# Patient Record
Sex: Female | Born: 1955
Health system: Southern US, Community
[De-identification: ages and names within clinical notes are randomized; demographics above are authoritative.]

## PROBLEM LIST (undated history)

## (undated) DIAGNOSIS — T8859XA Other complications of anesthesia, initial encounter: Secondary | ICD-10-CM

## (undated) DIAGNOSIS — M1990 Unspecified osteoarthritis, unspecified site: Secondary | ICD-10-CM

## (undated) DIAGNOSIS — D649 Anemia, unspecified: Secondary | ICD-10-CM

## (undated) DIAGNOSIS — C801 Malignant (primary) neoplasm, unspecified: Secondary | ICD-10-CM

## (undated) DIAGNOSIS — F419 Anxiety disorder, unspecified: Secondary | ICD-10-CM

## (undated) DIAGNOSIS — K219 Gastro-esophageal reflux disease without esophagitis: Secondary | ICD-10-CM

## (undated) DIAGNOSIS — T4145XA Adverse effect of unspecified anesthetic, initial encounter: Secondary | ICD-10-CM

## (undated) DIAGNOSIS — Z923 Personal history of irradiation: Secondary | ICD-10-CM

## (undated) DIAGNOSIS — E7800 Pure hypercholesterolemia, unspecified: Secondary | ICD-10-CM

## (undated) DIAGNOSIS — I10 Essential (primary) hypertension: Secondary | ICD-10-CM

## (undated) DIAGNOSIS — T7840XA Allergy, unspecified, initial encounter: Secondary | ICD-10-CM

## (undated) DIAGNOSIS — R51 Headache: Secondary | ICD-10-CM

## (undated) DIAGNOSIS — R32 Unspecified urinary incontinence: Secondary | ICD-10-CM

## (undated) HISTORY — DX: Malignant (primary) neoplasm, unspecified: C80.1

## (undated) HISTORY — DX: Anxiety disorder, unspecified: F41.9

## (undated) HISTORY — DX: Pure hypercholesterolemia, unspecified: E78.00

## (undated) HISTORY — DX: Essential (primary) hypertension: I10

## (undated) HISTORY — DX: Unspecified osteoarthritis, unspecified site: M19.90

## (undated) HISTORY — PX: ABDOMINAL HYSTERECTOMY: SHX81

## (undated) HISTORY — PX: OTHER SURGICAL HISTORY: SHX169

## (undated) HISTORY — PX: BREAST EXCISIONAL BIOPSY: SUR124

## (undated) HISTORY — DX: Gastro-esophageal reflux disease without esophagitis: K21.9

## (undated) HISTORY — PX: BREAST SURGERY: SHX581

## (undated) HISTORY — DX: Allergy, unspecified, initial encounter: T78.40XA

## (undated) HISTORY — PX: TUBAL LIGATION: SHX77

## (undated) HISTORY — DX: Anemia, unspecified: D64.9

---

## 1991-11-07 HISTORY — PX: BUNIONECTOMY: SHX129

## 1999-12-05 ENCOUNTER — Encounter: Admission: RE | Admit: 1999-12-05 | Discharge: 1999-12-05 | Payer: Self-pay | Admitting: Obstetrics and Gynecology

## 1999-12-05 ENCOUNTER — Encounter: Payer: Self-pay | Admitting: Obstetrics and Gynecology

## 2000-11-06 HISTORY — PX: NECK SURGERY: SHX720

## 2001-01-22 ENCOUNTER — Encounter: Admission: RE | Admit: 2001-01-22 | Discharge: 2001-01-22 | Payer: Self-pay | Admitting: Obstetrics and Gynecology

## 2001-01-22 ENCOUNTER — Encounter: Payer: Self-pay | Admitting: Obstetrics and Gynecology

## 2001-04-22 ENCOUNTER — Encounter: Payer: Self-pay | Admitting: Neurosurgery

## 2001-04-22 ENCOUNTER — Ambulatory Visit (HOSPITAL_COMMUNITY): Admission: RE | Admit: 2001-04-22 | Discharge: 2001-04-22 | Payer: Self-pay | Admitting: Neurosurgery

## 2001-06-04 ENCOUNTER — Encounter: Payer: Self-pay | Admitting: Neurosurgery

## 2001-06-04 ENCOUNTER — Ambulatory Visit (HOSPITAL_COMMUNITY): Admission: RE | Admit: 2001-06-04 | Discharge: 2001-06-04 | Payer: Self-pay | Admitting: Neurosurgery

## 2001-08-20 ENCOUNTER — Encounter: Payer: Self-pay | Admitting: Internal Medicine

## 2001-08-20 ENCOUNTER — Ambulatory Visit (HOSPITAL_COMMUNITY): Admission: RE | Admit: 2001-08-20 | Discharge: 2001-08-20 | Payer: Self-pay | Admitting: Internal Medicine

## 2001-10-21 ENCOUNTER — Inpatient Hospital Stay (HOSPITAL_COMMUNITY): Admission: RE | Admit: 2001-10-21 | Discharge: 2001-10-23 | Payer: Self-pay | Admitting: Gynecology

## 2001-10-21 ENCOUNTER — Encounter: Payer: Self-pay | Admitting: Gynecology

## 2001-10-22 ENCOUNTER — Encounter: Payer: Self-pay | Admitting: Gynecology

## 2002-02-03 ENCOUNTER — Encounter: Admission: RE | Admit: 2002-02-03 | Discharge: 2002-02-03 | Payer: Self-pay | Admitting: Gynecology

## 2002-02-03 ENCOUNTER — Encounter: Payer: Self-pay | Admitting: Gynecology

## 2002-02-22 ENCOUNTER — Encounter: Payer: Self-pay | Admitting: Neurosurgery

## 2002-02-22 ENCOUNTER — Ambulatory Visit (HOSPITAL_COMMUNITY): Admission: RE | Admit: 2002-02-22 | Discharge: 2002-02-22 | Payer: Self-pay | Admitting: Neurosurgery

## 2002-03-03 ENCOUNTER — Ambulatory Visit (HOSPITAL_COMMUNITY): Admission: RE | Admit: 2002-03-03 | Discharge: 2002-03-03 | Payer: Self-pay | Admitting: Neurosurgery

## 2002-03-03 ENCOUNTER — Encounter: Payer: Self-pay | Admitting: Neurosurgery

## 2002-08-12 ENCOUNTER — Other Ambulatory Visit: Admission: RE | Admit: 2002-08-12 | Discharge: 2002-08-12 | Payer: Self-pay | Admitting: Gynecology

## 2003-03-16 ENCOUNTER — Encounter: Payer: Self-pay | Admitting: Gynecology

## 2003-03-16 ENCOUNTER — Encounter: Admission: RE | Admit: 2003-03-16 | Discharge: 2003-03-16 | Payer: Self-pay | Admitting: Gynecology

## 2003-10-09 ENCOUNTER — Ambulatory Visit (HOSPITAL_COMMUNITY): Admission: RE | Admit: 2003-10-09 | Discharge: 2003-10-09 | Payer: Self-pay | Admitting: Internal Medicine

## 2003-12-29 ENCOUNTER — Other Ambulatory Visit: Admission: RE | Admit: 2003-12-29 | Discharge: 2003-12-29 | Payer: Self-pay | Admitting: Gynecology

## 2004-05-10 ENCOUNTER — Encounter: Admission: RE | Admit: 2004-05-10 | Discharge: 2004-05-10 | Payer: Self-pay | Admitting: Gynecology

## 2004-08-24 ENCOUNTER — Encounter: Admission: RE | Admit: 2004-08-24 | Discharge: 2004-08-24 | Payer: Self-pay | Admitting: Neurosurgery

## 2005-01-11 ENCOUNTER — Other Ambulatory Visit: Admission: RE | Admit: 2005-01-11 | Discharge: 2005-01-11 | Payer: Self-pay | Admitting: Gynecology

## 2005-05-12 ENCOUNTER — Encounter (HOSPITAL_COMMUNITY): Admission: RE | Admit: 2005-05-12 | Discharge: 2005-06-12 | Payer: Self-pay | Admitting: Orthopedic Surgery

## 2005-06-14 ENCOUNTER — Encounter (HOSPITAL_COMMUNITY): Admission: RE | Admit: 2005-06-14 | Discharge: 2005-07-14 | Payer: Self-pay | Admitting: Orthopedic Surgery

## 2005-12-11 ENCOUNTER — Encounter: Admission: RE | Admit: 2005-12-11 | Discharge: 2005-12-11 | Payer: Self-pay | Admitting: Gynecology

## 2006-01-01 ENCOUNTER — Encounter: Admission: RE | Admit: 2006-01-01 | Discharge: 2006-01-01 | Payer: Self-pay | Admitting: Gynecology

## 2006-01-15 ENCOUNTER — Other Ambulatory Visit: Admission: RE | Admit: 2006-01-15 | Discharge: 2006-01-15 | Payer: Self-pay | Admitting: Gynecology

## 2006-07-11 ENCOUNTER — Ambulatory Visit: Payer: Self-pay | Admitting: Gastroenterology

## 2006-07-12 ENCOUNTER — Ambulatory Visit: Payer: Self-pay | Admitting: Gastroenterology

## 2007-03-14 ENCOUNTER — Encounter: Admission: RE | Admit: 2007-03-14 | Discharge: 2007-03-14 | Payer: Self-pay | Admitting: Gynecology

## 2007-10-28 ENCOUNTER — Other Ambulatory Visit: Admission: RE | Admit: 2007-10-28 | Discharge: 2007-10-28 | Payer: Self-pay | Admitting: Gynecology

## 2008-04-15 ENCOUNTER — Encounter: Admission: RE | Admit: 2008-04-15 | Discharge: 2008-04-15 | Payer: Self-pay | Admitting: Internal Medicine

## 2008-06-11 ENCOUNTER — Ambulatory Visit (HOSPITAL_COMMUNITY): Admission: RE | Admit: 2008-06-11 | Discharge: 2008-06-11 | Payer: Self-pay | Admitting: Internal Medicine

## 2008-06-26 ENCOUNTER — Ambulatory Visit (HOSPITAL_COMMUNITY): Admission: RE | Admit: 2008-06-26 | Discharge: 2008-06-26 | Payer: Self-pay | Admitting: Internal Medicine

## 2009-08-17 ENCOUNTER — Encounter: Admission: RE | Admit: 2009-08-17 | Discharge: 2009-08-17 | Payer: Self-pay | Admitting: Internal Medicine

## 2010-08-15 ENCOUNTER — Encounter: Admission: RE | Admit: 2010-08-15 | Discharge: 2010-08-15 | Payer: Self-pay | Admitting: Family Medicine

## 2010-11-26 ENCOUNTER — Encounter: Payer: Self-pay | Admitting: Internal Medicine

## 2010-11-27 ENCOUNTER — Encounter: Payer: Self-pay | Admitting: Gynecology

## 2010-12-19 ENCOUNTER — Emergency Department (HOSPITAL_COMMUNITY)
Admission: EM | Admit: 2010-12-19 | Discharge: 2010-12-19 | Disposition: A | Discharge status: Home / Self Care | Payer: BC Managed Care – PPO | Attending: Emergency Medicine | Admitting: Emergency Medicine

## 2010-12-19 DIAGNOSIS — I10 Essential (primary) hypertension: Secondary | ICD-10-CM | POA: Insufficient documentation

## 2010-12-19 DIAGNOSIS — Z79899 Other long term (current) drug therapy: Secondary | ICD-10-CM | POA: Insufficient documentation

## 2010-12-19 DIAGNOSIS — R51 Headache: Secondary | ICD-10-CM | POA: Insufficient documentation

## 2011-03-24 NOTE — Letter (Signed)
July 11, 2006     Ms. Cynthia Ferguson  [address]  [place], [place] [address]   MRN:  [id number]  /  DOB:  December 18, 1955   Dear [name]:   It is my pleasure to have treated you recently as a new patient in my  office.  I appreciate your confidence and the opportunity to participate in  your care.   Since I do have a busy inpatient endoscopy schedule and office schedule, my  office hours vary weekly.  I am, however, available for emergency calls  every day through my office.  If I cannot promptly meet an urgent office  appointment, another one of our gastroenterologists will be able to assist  you.   My well-trained staff are prepared to help you at all times.  For  emergencies after office hours, a physician from our gastroenterology  section is always available through my 24-hour answering service.   While you are under my care, I encourage discussion of your questions and  concerns, and I will be happy to return your calls as soon as I am  available.   Once again, I welcome you as a new patient and I look forward to a happy and  healthy relationship.   Sincerely,     Barbette Hair. Arlyce Dice, MD,FACG   RDK/MedQ  DD:  07/11/2006  DT:  07/11/2006  Job #:  [id number]

## 2011-03-24 NOTE — Letter (Signed)
July 11, 2006     Cynthia Ferguson. Sherwood Gambler, MD  [address], [address]  [place], [place] [address]   RE:  Cynthia Ferguson, Cynthia Ferguson  MRN:  [id number]  /  DOB:  January 18, 1956   Dear Dr. Sherwood Gambler:   Upon your kind referral, I had the pleasure of evaluating your patient and I  am pleased to offer my findings.  I saw Ms. Cynthia Ferguson in the office today.  Enclosed is a copy of my progress note that details my findings and  recommendations.   Thank you for the opportunity to participate in your patient's care.    Sincerely,      Barbette Hair. Arlyce Dice, MD,FACG   RDK/MedQ  DD:  07/11/2006  DT:  07/11/2006  Job #:  [id number]

## 2011-03-24 NOTE — Assessment & Plan Note (Signed)
[facility]                           GASTROENTEROLOGY OFFICE NOTE   NAME:Cynthia Ferguson, Cynthia Ferguson                        MRN:          [id number]  DATE:07/11/2006                            DOB:          10-07-1956    REASON FOR CONSULTATION:  Constipation.   Cynthia Ferguson is a pleasant 55 year old white female referred through the  courtesy of Dr. Sherwood Gambler for evaluation.  She suffers from mild chronic  constipation.  She occasionally strains with stool and has seen small  amounts of blood on the toilet tissue.  She has what sounds like a  rectocele.  She feels this is contributing to her constipation.  She has  occasional flare up of her hemorrhoids, characterized by rectal discomfort.   PAST MEDICAL HISTORY:  Pertinent for arthritis, status post hysterectomy and  bladder surgery.  She is status post tubal ligation.  She suffers from  chronic headaches and depression.   FAMILY HISTORY:  Pertinent for heart disease in both parents.  Mother has  diabetes.   MEDICATIONS:  Diclofenac, Aciphex, alprazolam, Simvastatin, GlycoLax.   ALLERGIES:  NO KNOWN DRUG ALLERGIES.   SOCIAL HISTORY:  She neither smokes nor drinks.  She is divorced and works  for [employer].   REVIEW OF SYSTEMS:  Reviewed and was negative.   PHYSICAL EXAMINATION:  VITAL SIGNS:  Pulse 80, blood pressure 120/70, weight  165.  HEENT:  EOMI. PERRLA. Sclerae are anicteric.  Conjunctivae are pink.  NECK:  Supple without thyromegaly, adenopathy or carotid bruits.  CHEST:  Clear to auscultation and percussion without adventitious sounds.  CARDIAC:  Regular rhythm; normal S1 S2.  There are no murmurs, gallops or  rubs.  ABDOMEN:  Bowel sounds are normoactive.  Abdomen is soft, non-tender and non-  distended.  There are no abdominal masses, tenderness, splenic enlargement  or hepatomegaly.  EXTREMITIES:  Full range of motion.  No cyanosis, clubbing or edema.  RECTAL:  Deferred.   IMPRESSION:  1. Mild  chronic constipation.  Should she have a rectocele, this certainly      could be contributing to her symptoms.  2. Limited rectal bleeding--probably secondary to local hemorrhoidal      disease.   RECOMMENDATIONS:  Colonoscopy.                                   Barbette Hair. Arlyce Dice, MD,FACG   RDK/MedQ  DD:  07/11/2006  DT:  07/11/2006  Job #:  [id number]   cc:   Madelin Rear. Sherwood Gambler, MD  Gretta Cool, M.D.

## 2012-02-07 ENCOUNTER — Other Ambulatory Visit: Payer: Self-pay | Admitting: Nurse Practitioner

## 2012-02-07 DIAGNOSIS — Z1231 Encounter for screening mammogram for malignant neoplasm of breast: Secondary | ICD-10-CM

## 2012-02-15 ENCOUNTER — Ambulatory Visit
Admission: RE | Admit: 2012-02-15 | Discharge: 2012-02-15 | Disposition: A | Discharge status: Home / Self Care | Payer: BC Managed Care – PPO | Source: Ambulatory Visit | Attending: Nurse Practitioner | Admitting: Nurse Practitioner

## 2012-02-15 DIAGNOSIS — Z1231 Encounter for screening mammogram for malignant neoplasm of breast: Secondary | ICD-10-CM

## 2012-05-07 ENCOUNTER — Encounter: Payer: Self-pay | Admitting: Gynecology

## 2012-05-07 ENCOUNTER — Ambulatory Visit (INDEPENDENT_AMBULATORY_CARE_PROVIDER_SITE_OTHER): Payer: BC Managed Care – PPO | Admitting: Gynecology

## 2012-05-07 VITALS — BP 136/90

## 2012-05-07 DIAGNOSIS — N8110 Cystocele, unspecified: Secondary | ICD-10-CM | POA: Insufficient documentation

## 2012-05-07 DIAGNOSIS — N8111 Cystocele, midline: Secondary | ICD-10-CM

## 2012-05-07 DIAGNOSIS — N393 Stress incontinence (female) (male): Secondary | ICD-10-CM

## 2012-05-07 NOTE — Progress Notes (Signed)
Patient is a 56 year old who was referred to our practice from her primary physician as a result of patient's persistent stress urinary incontinence and the sensation of "feeling her intestines coming out". On further questioning the patient she stated that approximately 10 years ago she had had a total abdominal hysterectomy with bilateral salpingo-oophorectomy and some form a bladder suspension by another provider here in [place] (Dr. Nicholas Lose). Patient 40 years ago had seen Dr. Lorin Picket McDermaid (urologist) because of patient's recurrent urinary tract infections. He had placed her on Macrobid prophylactically which she would take 1 tablet daily. She later followed up with him because of worsening stress urinary incontinence and underwent a urodynamic evaluation and she had been placed on Toviaz and later her dose was increased along with the addition of Myrbetriq but has continued with the symptoms. Patient has incontinence with minimal activity and once she begins to urinate she cannot stop work control it.  Exam: Bartholin urethra Skene glands within normal limits Vagina: Well estrogenized vaginal cuff intact. A first degree cystocele and first degree rectocele.  In the supine position during Valsalva maneuver patient leaked urine. In the right position when patient was asked to cough she began to perfuse leg urinate uncontrollably in the examination room.  Assessment/plan: Patient with severe stress urinary incontinence along with first degree cystocele and rectocele. 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 Patient has a followup appointment with the urologist in the next few days and all for him a copy of this office note. It has been several years and she had a urodynamic evaluation. She is a candidate for a retropubic sling procedure and possibly at a later date if symptoms are not completely improved whether she would be a candidate for Botox treatment. Literature information was provided and we'll  follow accordingly.

## 2012-05-07 NOTE — Patient Instructions (Addendum)
Urinary Incontinence Your doctor wants you to have this information about urinary incontinence. This is the inability to keep urine in your body until you decide to release it. CAUSES  Prostate gland enlargement is a common cause of urinary incontinence. But there are many different causes for losing urinary control. They include:  Medicines.   Infections.   Prostate problems.   Surgery.   Neurological diseases.   Emotional factors.  DIAGNOSIS  Evaluating the cause of incontinence is important in choosing the best treatment. This may require:  An ultrasound exam.   Kidney and bladder X-rays.   Cystoscopy. This is an exam of the bladder using a narrow scope.  TREATMENT  For incontinent patients, normal daily hygiene and using changing pads or adult diapers regularly will prevent offensive odors and skin damage from the moisture. Changing your medicines may help control incontinence. Your caregiver may prescribe some medicines to help you regain control. Avoid caffeine. It can over-stimulate the bladder. Use the bathroom regularly. Try about every 2 to 3 hours even if you do not feel the need. Take time to empty your bladder completely. After urinating, wait a minute. Then try to urinate again. External devices used to catch urine or an indwelling urine catheter (Foley catheter) may be needed as well. Some prostate gland problems require surgery to correct. Call your caregiver for more information. Document Released: 11/30/2004 Document Revised: 10/12/2011 Document Reviewed: 11/25/2008 [facility] Patient Information 2012 [place], [place].  Cystocele Repair A cystocele is a bulging, drooping hernia or break (rupture) of bladder tissue into the birth canal (vagina). This bulging or rupture occurs on the top front wall of the vagina. CAUSES  Cystocele is associated with weakness of the top front wall of the vagina due to stretching and tearing of the ligaments and muscles in the area. This  is often the result of:  Multiple childbirths.   Continuous heavy lifting.   Chronic cough from asthma, emphysema, or smoking.   Being overweight.   Changes from aging.   Previous surgery in the vaginal area.   Menopause with loss of estrogen hormone and weakening of the ligaments and muscles around the bladder.  SYMPTOMS   Uncontrolled loss of urine (incontinence) with cough, sneeze, or exercise.   Pelvic pressure.   Frequency or urgency to urinate because of inability to completely empty the bladder.   Bladder infections.   Needing to push on the upper vagina to help yourself pass urine.  DIAGNOSIS  A cystocele can be diagnosed by doing a pelvic exam and observing the top of the vagina drooping or bulging into or out of the vagina. TREATMENT  Surgical options:  Cystocele repair is surgery that removes the hernia.   There are also different "sling" operations that may be used.  Discuss the different types of surgeries to repair a cystocele with your caregiver. Your caregiver will decide what type of surgery will be best in your case. Nonsurgical options:  Kegel exercises. This helps strengthen and tighten the muscles and tissue in and around the bladder and vagina. This may help with mild cases of cystocele.   A pessary may help the cystocele. A pessary is a plastic or rubber device that lifts the bladder into place. A pessary must be fitted by a doctor.   Tampons or diaphragms that lift the bladder into place are sometimes helpful with a minor or small cystocele.   Estrogen may help with mild cases in menopausal and aging women.  LET YOUR CAREGIVER  KNOW ABOUT:   Allergies to food or medicine.   Medicines taken, including vitamins, herbs, eyedrops, over-the-counter medicines, and creams.   Use of steroids (by mouth or creams).   Previous problems with anesthetics or numbing medicines.   History of bleeding problems or blood clots.   Previous surgery.   Other  health problems, including diabetes and kidney problems.   Possibility of pregnancy, if this applies.  RISKS AND COMPLICATIONS  All surgery is associated with risks.  There are risks with a general anesthesia. You should discuss this with your caregiver.   With spinal or epidural anesthesia, there may be an area that is not numbed, and you could feel pain.   Headache could occur with a spinal or epidural anesthetic.   The catheter you will have after surgery may not work properly or may get blocked and need to be replaced.   Excessive bleeding.   Infection.   Injury to surrounding structures.   Recurrence of the cystocele.   Surgery may not get rid of your symptoms.  BEFORE THE PROCEDURE   Do not take aspirin or blood thinners for 1 week prior to surgery, unless instructed otherwise.   Do not eat or drink anything after midnight the night before surgery.   Let your caregiver know if you develop a cold or other infectious problems prior to surgery.   If being admitted the day of surgery, you should be present 1 hour prior to your procedure or as directed by your caregiver.   Plan and arrange for help when you go home from the hospital.   If you smoke, do not smoke for at least 2 weeks before the surgery.   Do not drink any alcohol for 3 days before the surgery.  PROCEDURE  You will be given an anesthetic to prevent you from feeling pain during surgery. This may be a general anesthetic that puts you to sleep, or a spinal or epidural anesthetic. You will be asleep or be numbed through the entire procedure. During cystocele repair, tissue is pulled from the sides and around the top of the vagina to lift up the hernia. This removes the hernia so that the top of the vagina does not fall into the opening of the vagina. AFTER THE PROCEDURE  After surgery, you will be taken to the recovery room where a nurse will take care of you, checking your breathing, blood pressure, pulse, and  your progress. When your caregiver feels you are stable, you will be taken to your room. You will have a drainage tube (Foley catheter) that will drain your bladder for 2 to 7 days or longer, until your bladder is working properly. This catheter is placed prior to surgery to help keep your bladder empty and out of the way during the procedure. After surgery, this will make passing your urine easier. The catheter will be removed when you can easily pass urine without this assistance. You may have gauze packing in the vagina that will be removed 1 to 2 days after the surgery. Usually, you will be given a medicine (antibiotic) that kills germs. You will be given pain medicine as needed. You can usually go home in 3 to 5 days. HOME CARE INSTRUCTIONS   Do not take baths. Take showers until your caregiver informs you otherwise.   Take antibiotics as directed by your caregiver.   Exercise as instructed. Do not perform exercises which increase the pressure inside your belly (abdomen), such as sit-ups or lifting  weights, until your caregiver has given permission. Walking exercise is preferred.   Only take over-the-counter or prescription medicines for pain and discomfort as directed by your caregiver.   Do not drink alcohol while taking pain medicine.   Do not lift anything over 5 pounds.   Do not drive until your caregiver gives you permission.   Get plenty of rest and sleep.   Have someone help with your household chores for 1 to 2 weeks.   If you develop constipation, you may take a mild laxative with your caregiver's permission. Eating bran foods and drinking enough water and fluids to keep your urine clear or pale yellow helps with constipation.   Do not take aspirin. It may cause bleeding.   You may resume normal diet and unstrenuous activities as directed.   Do not douche, use tampons, or engage in intercourse until your surgeon has given permission.   Change bandages (dressings) as  directed.   Make and keep all your postoperative appointments.  SEEK MEDICAL CARE IF:   You have abnormal vaginal discharge.   You develop a rash.   You are having a reaction to your medicine.   You develop nausea or vomiting.  SEEK IMMEDIATE MEDICAL CARE IF:   You have redness, swelling, or increasing pain in the vaginal area.   You notice pus coming from the vagina.   You have a fever.   You notice a bad smell coming from the vagina.   You have increasing abdominal pain.   You have frequent urination or you notice burning during urination.   You notice blood in your urine.   You have excessive vaginal bleeding.   You cannot urinate.  MAKE SURE YOU:   Understand these instructions.   Will watch your condition.   Will get help right away if you are not doing well or get worse.  Document Released: 10/20/2000 Document Revised: 10/12/2011 Document Reviewed: 01/20/2010 [facility] Patient Information 2012 [place], [place].

## 2012-09-06 HISTORY — PX: BUNIONECTOMY: SHX129

## 2012-11-06 DIAGNOSIS — Z923 Personal history of irradiation: Secondary | ICD-10-CM

## 2012-11-06 DIAGNOSIS — C801 Malignant (primary) neoplasm, unspecified: Secondary | ICD-10-CM

## 2012-11-06 HISTORY — PX: BREAST LUMPECTOMY: SHX2

## 2012-11-06 HISTORY — DX: Malignant (primary) neoplasm, unspecified: C80.1

## 2012-11-06 HISTORY — DX: Personal history of irradiation: Z92.3

## 2013-04-28 ENCOUNTER — Other Ambulatory Visit: Payer: Self-pay

## 2013-04-28 DIAGNOSIS — Z1231 Encounter for screening mammogram for malignant neoplasm of breast: Secondary | ICD-10-CM

## 2013-05-08 ENCOUNTER — Ambulatory Visit
Admission: RE | Admit: 2013-05-08 | Discharge: 2013-05-08 | Disposition: A | Discharge status: Home / Self Care | Payer: BC Managed Care – PPO | Source: Ambulatory Visit

## 2013-05-08 DIAGNOSIS — Z1231 Encounter for screening mammogram for malignant neoplasm of breast: Secondary | ICD-10-CM

## 2013-05-14 ENCOUNTER — Other Ambulatory Visit: Payer: Self-pay | Admitting: Gynecology

## 2013-05-19 ENCOUNTER — Other Ambulatory Visit: Payer: Self-pay | Admitting: Gynecology

## 2013-05-21 ENCOUNTER — Other Ambulatory Visit: Payer: Self-pay | Admitting: Gynecology

## 2013-05-21 DIAGNOSIS — R928 Other abnormal and inconclusive findings on diagnostic imaging of breast: Secondary | ICD-10-CM

## 2013-06-02 ENCOUNTER — Other Ambulatory Visit: Payer: Self-pay | Admitting: Gynecology

## 2013-06-02 ENCOUNTER — Ambulatory Visit
Admission: RE | Admit: 2013-06-02 | Discharge: 2013-06-02 | Disposition: A | Discharge status: Home / Self Care | Payer: BC Managed Care – PPO | Source: Ambulatory Visit | Attending: Gynecology | Admitting: Gynecology

## 2013-06-02 DIAGNOSIS — R928 Other abnormal and inconclusive findings on diagnostic imaging of breast: Secondary | ICD-10-CM

## 2013-06-02 DIAGNOSIS — R921 Mammographic calcification found on diagnostic imaging of breast: Secondary | ICD-10-CM

## 2013-06-05 ENCOUNTER — Ambulatory Visit (INDEPENDENT_AMBULATORY_CARE_PROVIDER_SITE_OTHER): Payer: BC Managed Care – PPO | Admitting: Gynecology

## 2013-06-05 ENCOUNTER — Encounter: Payer: Self-pay | Admitting: Gynecology

## 2013-06-05 VITALS — BP 128/74 | Ht 61.0 in | Wt 144.0 lb

## 2013-06-05 DIAGNOSIS — N3941 Urge incontinence: Secondary | ICD-10-CM

## 2013-06-05 DIAGNOSIS — N8111 Cystocele, midline: Secondary | ICD-10-CM

## 2013-06-05 DIAGNOSIS — N393 Stress incontinence (female) (male): Secondary | ICD-10-CM

## 2013-06-05 DIAGNOSIS — N952 Postmenopausal atrophic vaginitis: Secondary | ICD-10-CM | POA: Insufficient documentation

## 2013-06-05 DIAGNOSIS — Z780 Asymptomatic menopausal state: Secondary | ICD-10-CM | POA: Insufficient documentation

## 2013-06-05 DIAGNOSIS — I10 Essential (primary) hypertension: Secondary | ICD-10-CM | POA: Insufficient documentation

## 2013-06-05 DIAGNOSIS — N951 Menopausal and female climacteric states: Secondary | ICD-10-CM

## 2013-06-05 DIAGNOSIS — E785 Hyperlipidemia, unspecified: Secondary | ICD-10-CM | POA: Insufficient documentation

## 2013-06-05 DIAGNOSIS — IMO0002 Reserved for concepts with insufficient information to code with codable children: Secondary | ICD-10-CM

## 2013-06-05 DIAGNOSIS — Z01419 Encounter for gynecological examination (general) (routine) without abnormal findings: Secondary | ICD-10-CM

## 2013-06-05 DIAGNOSIS — N816 Rectocele: Secondary | ICD-10-CM | POA: Insufficient documentation

## 2013-06-05 MED ORDER — NONFORMULARY OR COMPOUNDED ITEM: Status: DC

## 2013-06-05 NOTE — Progress Notes (Signed)
Cynthia Ferguson November 22, 1955 [id number]   History:    57 y.o.  for annual gyn exam . I saw patient for the first time on July 2 of this year complaining of persistent stress urinary incontinence and the sensation of "feeling her intestines coming out". On further questioning the patient she stated that approximately 10 years ago she had had a total abdominal hysterectomy with bilateral salpingo-oophorectomy and some form a bladder suspension by another provider here in [place] (Dr. Nicholas Lose). Patient 40 years ago had seen Dr. Lorin Picket McDermaid (urologist) because of patient's recurrent urinary tract infections. He had placed her on Macrobid prophylactically which she would take 1 tablet daily. She later followed up with him because of worsening stress urinary incontinence and underwent a urodynamic evaluation and she had been placed on Toviaz and later her dose was increased along with the addition of Myrbetriq but has continued with the symptoms. Patient has incontinence with minimal activity and once she begins to urinate she cannot stop work control it. She has since been to see him several times and she's currently not taking any medications is working on the table exercises. She had stopped the estrogen vaginal cream. The patient A. Scheduled for stereotactic left breast biopsy next week. Patient had a normal colonoscopy 6 years ago. Her last bone density study was over 2 years ago. She has suffered from vaginal atrophy as well and the past.    Past medical history,surgical history, family history and social history were all reviewed and documented in the EPIC chart.  Gynecologic History No LMP recorded. Patient is postmenopausal. Contraception: status post hysterectomy Last Pap: 2013. Results were: normal Last mammogram: see above. Results were: see above  Obstetric History OB History   Grav Para Term Preterm Abortions TAB SAB Ect Mult Living   2 2 2       2      # Outc Date GA Lbr Len/2nd Wgt Sex  Del Anes PTL Lv   1 TRM     M SVD  No Yes   2 TRM     M SVD  No Yes       ROS: A ROS was performed and pertinent positives and negatives are included in the history.  GENERAL: No fevers or chills. HEENT: No change in vision, no earache, sore throat or sinus congestion. NECK: No pain or stiffness. CARDIOVASCULAR: No chest pain or pressure. No palpitations. PULMONARY: No shortness of breath, cough or wheeze. GASTROINTESTINAL: No abdominal pain, nausea, vomiting or diarrhea, melena or bright red blood per rectum. GENITOURINARY: No urinary frequency, urgency, hesitancy or dysuria. MUSCULOSKELETAL: No joint or muscle pain, no back pain, no recent trauma. DERMATOLOGIC: No rash, no itching, no lesions. ENDOCRINE: No polyuria, polydipsia, no heat or cold intolerance. No recent change in weight. HEMATOLOGICAL: No anemia or easy bruising or bleeding. NEUROLOGIC: No headache, seizures, numbness, tingling or weakness. PSYCHIATRIC: No depression, no loss of interest in normal activity or change in sleep pattern.     Exam: chaperone present  BP 128/74  Ht 5\' 1"  (1.549 m)  Wt 144 lb (65.318 kg)  BMI 27.22 kg/m2  Body mass index is 27.22 kg/(m^2).  General appearance : Well developed well nourished female. No acute distress HEENT: Neck supple, trachea midline, no carotid bruits, no thyroidmegaly Lungs: Clear to auscultation, no rhonchi or wheezes, or rib retractions  Heart: Regular rate and rhythm, no murmurs or gallops Breast:Examined in sitting and supine position were symmetrical in appearance, no palpable masses or  tenderness,  no skin retraction, no nipple inversion, no nipple discharge, no skin discoloration, no axillary or supraclavicular lymphadenopathy Abdomen: no palpable masses or tenderness, no rebound or guarding Extremities: no edema or skin discoloration or tenderness  Pelvic:  Bartholin, Urethra, Skene Glands: Within normal limits             Vagina: No gross lesions or  discharge,first-degree cystocele first-degree rectocele  Cervix: absent  Uterus  Absent  Adnexa  Without masses or tenderness  Anus and perineum  normal   Rectovaginal  normal sphincter tone without palpated masses or tenderness             Hemoccult Card provided     Assessment/Plan:  56 y.o. female for annual exam with first-degree cystocele and first-degree rectocele with history of stress urinary incontinence has continued to work with the urologist for alternative treatments. She stopped all her anticholinergics and is working on the Kegel exercises. I'm going to start her back on the estrogen vaginal cream to apply twice a week. Risks benefits and pros and cons were discussed. Her PCP has been doing her labs. She will schedule her bone density study here in our office. She was provided with Hemoccult course is symmetrical the office for testing. She will check on the vaccinations with her primary physician. We discussed importance of calcium and vitamin D for osteoporosis prevention. No Pap smear done today new guidelines were discussed    Ok Edwards MD, 1:46 PM 06/05/2013

## 2013-06-05 NOTE — Patient Instructions (Addendum)
Tetanus, Diphtheria, Pertussis (Tdap) Vaccine What You Need to Know WHY GET VACCINATED? Tetanus, diphtheria and pertussis can be very serious diseases, even for adolescents and adults. Tdap vaccine can protect us from these diseases. TETANUS (Lockjaw) causes painful muscle tightening and stiffness, usually all over the body.  It can lead to tightening of muscles in the head and neck so you can't open your mouth, swallow, or sometimes even breathe. Tetanus kills about 1 out of 5 people who are infected. DIPHTHERIA can cause a thick coating to form in the back of the throat.  It can lead to breathing problems, paralysis, heart failure, and death. PERTUSSIS (Whooping Cough) causes severe coughing spells, which can cause difficulty breathing, vomiting and disturbed sleep.  It can also lead to weight loss, incontinence, and rib fractures. Up to 2 in 100 adolescents and 5 in 100 adults with pertussis are hospitalized or have complications, which could include pneumonia and death. These diseases are caused by bacteria. Diphtheria and pertussis are spread from person to person through coughing or sneezing. Tetanus enters the body through cuts, scratches, or wounds. Before vaccines, the [place] saw as many as 200,000 cases a year of diphtheria and pertussis, and hundreds of cases of tetanus. Since vaccination began, tetanus and diphtheria have dropped by about 99% and pertussis by about 80%. TDAP VACCINE Tdap vaccine can protect adolescents and adults from tetanus, diphtheria, and pertussis. One dose of Tdap is routinely given at age 11 or 12. People who did not get Tdap at that age should get it as soon as possible. Tdap is especially important for health care professionals and anyone having close contact with a baby younger than 12 months. Pregnant women should get a dose of Tdap during every pregnancy, to protect the newborn from pertussis. Infants are most at risk for severe, life-threatening  complications from pertussis. A similar vaccine, called Td, protects from tetanus and diphtheria, but not pertussis. A Td booster should be given every 10 years. Tdap may be given as one of these boosters if you have not already gotten a dose. Tdap may also be given after a severe cut or burn to prevent tetanus infection. Your doctor can give you more information. Tdap may safely be given at the same time as other vaccines. SOME PEOPLE SHOULD NOT GET THIS VACCINE  If you ever had a life-threatening allergic reaction after a dose of any tetanus, diphtheria, or pertussis containing vaccine, OR if you have a severe allergy to any part of this vaccine, you should not get Tdap. Tell your doctor if you have any severe allergies.  If you had a coma, or long or multiple seizures within 7 days after a childhood dose of DTP or DTaP, you should not get Tdap, unless a cause other than the vaccine was found. You can still get Td.  Talk to your doctor if you:  have epilepsy or another nervous system problem,  had severe pain or swelling after any vaccine containing diphtheria, tetanus or pertussis,  ever had Guillain-Barr Syndrome (GBS),  aren't feeling well on the day the shot is scheduled. RISKS OF A VACCINE REACTION With any medicine, including vaccines, there is a chance of side effects. These are usually mild and go away on their own, but serious reactions are also possible. Brief fainting spells can follow a vaccination, leading to injuries from falling. Sitting or lying down for about 15 minutes can help prevent these. Tell your doctor if you feel dizzy or light-headed, or   have vision changes or ringing in the ears. Mild problems following Tdap (Did not interfere with activities)  Pain where the shot was given (about 3 in 4 adolescents or 2 in 3 adults)  Redness or swelling where the shot was given (about 1 person in 5)  Mild fever of at least 100.4F (up to about 1 in 25 adolescents or 1 in  100 adults)  Headache (about 3 or 4 people in 10)  Tiredness (about 1 person in 3 or 4)  Nausea, vomiting, diarrhea, stomach ache (up to 1 in 4 adolescents or 1 in 10 adults)  Chills, body aches, sore joints, rash, swollen glands (uncommon) Moderate problems following Tdap (Interfered with activities, but did not require medical attention)  Pain where the shot was given (about 1 in 5 adolescents or 1 in 100 adults)  Redness or swelling where the shot was given (up to about 1 in 16 adolescents or 1 in 25 adults)  Fever over 102F (about 1 in 100 adolescents or 1 in 250 adults)  Headache (about 3 in 20 adolescents or 1 in 10 adults)  Nausea, vomiting, diarrhea, stomach ache (up to 1 or 3 people in 100)  Swelling of the entire arm where the shot was given (up to about 3 in 100). Severe problems following Tdap (Unable to perform usual activities, required medical attention)  Swelling, severe pain, bleeding and redness in the arm where the shot was given (rare). A severe allergic reaction could occur after any vaccine (estimated less than 1 in a million doses). WHAT IF THERE IS A SERIOUS REACTION? What should I look for?  Look for anything that concerns you, such as signs of a severe allergic reaction, very high fever, or behavior changes. Signs of a severe allergic reaction can include hives, swelling of the face and throat, difficulty breathing, a fast heartbeat, dizziness, and weakness. These would start a few minutes to a few hours after the vaccination. What should I do?  If you think it is a severe allergic reaction or other emergency that can't wait, call 9-1-1 or get the person to the nearest hospital. Otherwise, call your doctor.  Afterward, the reaction should be reported to the "Vaccine Adverse Event Reporting System" (VAERS). Your doctor might file this report, or you can do it yourself through the VAERS web site at www.vaers.hhs.gov, or by calling 1-800-822-7967. VAERS is  only for reporting reactions. They do not give medical advice.  THE NATIONAL VACCINE INJURY COMPENSATION PROGRAM The National Vaccine Injury Compensation Program (VICP) is a federal program that was created to compensate people who may have been injured by certain vaccines. Persons who believe they may have been injured by a vaccine can learn about the program and about filing a claim by calling 1-800-338-2382 or visiting the VICP website at www.hrsa.gov/vaccinecompensation. HOW CAN I LEARN MORE?  Ask your doctor.  Call your local or state health department.  Contact the Centers for Disease Control and Prevention (CDC):  Call 1-800-232-4636 or visit CDC's website at www.cdc.gov/vaccines. CDC Tdap Vaccine VIS (03/14/12) Document Released: 04/23/2012 Document Revised: 07/17/2012 Document Reviewed: 04/23/2012 Exit[employer] Patient Information 2014 [employer].  Shingles Vaccine What You Need to Know WHAT IS SHINGLES?  Shingles is a painful skin rash, often with blisters. It is also called Herpes Zoster or just Zoster.  A shingles rash usually appears on one side of the face or body and lasts from 2 to 4 weeks. Its main symptom is pain, which can be quite severe.   Other symptoms of shingles can include fever, headache, chills, and upset stomach. Very rarely, a shingles infection can lead to pneumonia, hearing problems, blindness, brain inflammation (encephalitis), or death.  For about 1 person in 5, severe pain can continue even after the rash clears up. This is called post-herpetic neuralgia.  Shingles is caused by the Varicella Zoster virus. This is the same virus that causes chickenpox. Only someone who has had a case of chickenpox or rarely, has gotten chickenpox vaccine, can get shingles. The virus stays in your body. It can reappear many years later to cause a case of shingles.  You cannot catch shingles from another person with shingles. However, a person who has never had chickenpox (or  chickenpox vaccine) could get chickenpox from someone with shingles. This is not very common.  Shingles is far more common in people 50 and older than in younger people. It is also more common in people whose immune systems are weakened because of a disease such as cancer or drugs such as steroids or chemotherapy.  At least 1 million people get shingles per year in the [place]. SHINGLES VACCINE  A vaccine for shingles was licensed in 2006. In clinical trials, the vaccine reduced the risk of shingles by 50%. It can also reduce the pain in people who still get shingles after being vaccinated.  A single dose of shingles vaccine is recommended for adults 60 years of age and older. SOME PEOPLE SHOULD NOT GET SHINGLES VACCINE OR SHOULD WAIT A person should not get shingles vaccine if he or she:  Has ever had a life-threatening allergic reaction to gelatin, the antibiotic neomycin, or any other component of shingles vaccine. Tell your caregiver if you have any severe allergies.  Has a weakened immune system because of current:  AIDS or another disease that affects the immune system.  Treatment with drugs that affect the immune system, such as prolonged use of high-dose steroids.  Cancer treatment, such as radiation or chemotherapy.  Cancer affecting the bone marrow or lymphatic system, such as leukemia or lymphoma.  Is pregnant, or might be pregnant. Women should not become pregnant until at least 4 weeks after getting shingles vaccine. Someone with a minor illness, such as a cold, may be vaccinated. Anyone with a moderate or severe acute illness should usually wait until he or she recovers before getting the vaccine. This includes anyone with a temperature of 101.3 F (38 C) or higher. WHAT ARE THE RISKS FROM SHINGLES VACCINE?  A vaccine, like any medicine, could possibly cause serious problems, such as severe allergic reactions. However, the risk of a vaccine causing serious harm, or  death, is extremely small.  No serious problems have been identified with shingles vaccine. Mild Problems  Redness, soreness, swelling, or itching at the site of the injection (about 1 person in 3).  Headache (about 1 person in 70). Like all vaccines, shingles vaccine is being closely monitored for unusual or severe problems. WHAT IF THERE IS A MODERATE OR SEVERE REACTION? What should I look for? Any unusual condition, such as a severe allergic reaction or a high fever. If a severe allergic reaction occurred, it would be within a few minutes to an hour after the shot. Signs of a serious allergic reaction can include difficulty breathing, weakness, hoarseness or wheezing, a fast heartbeat, hives, dizziness, paleness, or swelling of the throat. What should I do?  Call your caregiver, or get the person to a caregiver right away.  Tell the   caregiver what happened, the date and time it happened, and when the vaccination was given.  Ask the caregiver to report the reaction by filing a Vaccine Adverse Event Reporting System (VAERS) form. Or, you can file this report through the VAERS web site at www.vaers.hhs.gov or by calling 1-800-822-7967. VAERS does not provide medical advice. HOW CAN I LEARN MORE?  Ask your caregiver. He or she can give you the vaccine package insert or suggest other sources of information.  Contact the Centers for Disease Control and Prevention (CDC):  Call 1-800-232-4636 (1-800-CDC-INFO).  Visit the CDC website at www.cdc.gov/vaccines CDC Shingles Vaccine VIS (08/11/08) Document Released: 08/20/2006 Document Revised: 01/15/2012 Document Reviewed: 08/11/2008 ExitCare Patient Information 2014 [employer].   

## 2013-06-12 ENCOUNTER — Ambulatory Visit
Admission: RE | Admit: 2013-06-12 | Discharge: 2013-06-12 | Disposition: A | Discharge status: Home / Self Care | Payer: BC Managed Care – PPO | Source: Ambulatory Visit | Attending: Gynecology | Admitting: Gynecology

## 2013-06-12 DIAGNOSIS — R921 Mammographic calcification found on diagnostic imaging of breast: Secondary | ICD-10-CM

## 2013-06-13 ENCOUNTER — Other Ambulatory Visit: Payer: Self-pay | Admitting: Gynecology

## 2013-06-13 DIAGNOSIS — D0512 Intraductal carcinoma in situ of left breast: Secondary | ICD-10-CM

## 2013-06-17 ENCOUNTER — Ambulatory Visit
Admission: RE | Admit: 2013-06-17 | Discharge: 2013-06-17 | Disposition: A | Discharge status: Home / Self Care | Payer: BC Managed Care – PPO | Source: Ambulatory Visit | Attending: Gynecology | Admitting: Gynecology

## 2013-06-17 DIAGNOSIS — D0512 Intraductal carcinoma in situ of left breast: Secondary | ICD-10-CM

## 2013-06-17 MED ORDER — GADOBENATE DIMEGLUMINE 529 MG/ML IV SOLN
13.0000 mL | Freq: Once | INTRAVENOUS | Status: AC | PRN
  Administered 2013-06-17: 13 mL via INTRAVENOUS

## 2013-06-23 ENCOUNTER — Ambulatory Visit (INDEPENDENT_AMBULATORY_CARE_PROVIDER_SITE_OTHER): Payer: BC Managed Care – PPO | Admitting: General Surgery

## 2013-06-23 ENCOUNTER — Encounter (INDEPENDENT_AMBULATORY_CARE_PROVIDER_SITE_OTHER): Payer: Self-pay | Admitting: General Surgery

## 2013-06-23 VITALS — BP 116/72 | HR 68 | Temp 98.1°F | Resp 14 | Ht 61.0 in | Wt 143.0 lb

## 2013-06-23 DIAGNOSIS — C50412 Malignant neoplasm of upper-outer quadrant of left female breast: Secondary | ICD-10-CM

## 2013-06-23 DIAGNOSIS — C50419 Malignant neoplasm of upper-outer quadrant of unspecified female breast: Secondary | ICD-10-CM

## 2013-06-24 NOTE — Progress Notes (Addendum)
Patient ID: Carolynne W Mccambridge, female   DOB: 02/02/1956, 57 y.o.   MRN: [id number]  Chief Complaint  Patient presents with  . New Evaluation    eval Left br DCIS    HPI Ersel W Valdez is a 57 y.o. female.  Referred by Dr Beth Brown HPI 57 yof who underwent routine annual screening mm and was found to have a 5 mm area of calcifications in the upper central portion of the left breast.  She has undergone MR that shows postbiopsy change but no other left breast abnormaloities, no right breast abnormalities and no abnormal nodes.  She underwent stereotactic biopsy with pathology showing grade II to III, er pos at 67%, pr pos at 4% DCIS.  She presents today without any complaints referable to her breasts to discuss options. She has prior excisional biopsy on the left breast that was benign.  Past Medical History  Diagnosis Date  . Anxiety   . High cholesterol   . Hypertension   . Arthritis   . GERD (gastroesophageal reflux disease)     Past Surgical History  Procedure Laterality Date  . Breast surgery      LEFT BREAST LUMPECTOMY - BENIGN  . Bunionectomy  1993    LEFT FOOT  . Abdominal hysterectomy      COMPLETE   . Neck surgery  2002    DISC REPLACED IN NECK  . Bunionectomy Right NOVEMBER 2013    HAMMER TOE    Family History  Problem Relation Age of Onset  . Diabetes Mother   . Hypertension Mother   . Heart disease Mother   . Hypertension Father   . Heart disease Father   . Cancer Father     LUNG, LIVER  . Hypertension Sister   . Hypertension Brother   . Hypertension Sister   . Cancer Maternal Aunt     breast  . Cancer Paternal Aunt     breast    Social History History  Substance Use Topics  . Smoking status: Never Smoker   . Smokeless tobacco: Never Used  . Alcohol Use: No    No Known Allergies  Current Outpatient Prescriptions  Medication Sig Dispense Refill  . acidophilus (RISAQUAD) CAPS capsule Take by mouth daily.      . ALPRAZolam (XANAX) 0.5 MG tablet Take  0.5 mg by mouth 2 (two) times daily. As needed for sleep and anxiety      . aspirin-acetaminophen-caffeine (EXCEDRIN MIGRAINE) 250-250-65 MG per tablet Take 2 tablets by mouth as needed.      . calcium-vitamin D (OSCAL) 250-125 MG-UNIT per tablet Take 1 tablet by mouth daily.      . Cranberry-Vitamin C-Probiotic (AZO CRANBERRY) 250-30 MG TABS Take by mouth.      . losartan (COZAAR) 100 MG tablet Take 100 mg by mouth daily.      . Magnesium 250 MG TABS Take by mouth.      . multivitamin-lutein (OCUVITE-LUTEIN) CAPS capsule Take 1 capsule by mouth daily.      . nitrofurantoin (MACRODANTIN) 100 MG capsule Take 100 mg by mouth daily.      . nitrofurantoin, macrocrystal-monohydrate, (MACROBID) 100 MG capsule Take 100 mg by mouth daily.      . omeprazole (PRILOSEC) 20 MG capsule Take 20 mg by mouth daily.      . pentazocine-acetaminophen (TALACEN) 25-650 MG TABS Take 1 tablet by mouth as needed.      . polyethylene glycol (MIRALAX / GLYCOLAX) packet Take 17 g   by mouth as needed.      . simvastatin (ZOCOR) 10 MG tablet Take 10 mg by mouth at bedtime.      . aspirin 81 MG tablet Take 81 mg by mouth daily.      . conjugated estrogens (PREMARIN) vaginal cream Place vaginally daily. Insert 1/3 applicatorful 3 x a week for 6 weeks then twice a week      . diclofenac (VOLTAREN) 50 MG EC tablet Take 50 mg by mouth daily.      . fesoterodine (TOVIAZ) 4 MG TB24 Take 4 mg by mouth daily.      . mirabegron ER (MYRBETRIQ) 50 MG TB24 Take 50 mg by mouth daily.      . NONFORMULARY OR COMPOUNDED ITEM Estradiol .02% 1 ML Prefilled Applicator Sig: apply vaginally twice a week #90 Day Supply with 4 refills  1 each  4   No current facility-administered medications for this visit.    Review of Systems Review of Systems  Constitutional: Negative for fever, chills and unexpected weight change.  HENT: Negative for hearing loss, congestion, sore throat, trouble swallowing and voice change.   Eyes: Negative for visual  disturbance.  Respiratory: Negative for cough and wheezing.   Cardiovascular: Negative for chest pain, palpitations and leg swelling.  Gastrointestinal: Positive for constipation. Negative for nausea, vomiting, abdominal pain, diarrhea, blood in stool, abdominal distention and anal bleeding.  Genitourinary: Negative for hematuria, vaginal bleeding and difficulty urinating.  Musculoskeletal: Positive for arthralgias.  Skin: Negative for rash and wound.  Neurological: Positive for headaches. Negative for seizures and syncope.  Hematological: Negative for adenopathy. Does not bruise/bleed easily.  Psychiatric/Behavioral: Negative for confusion.    Blood pressure 116/72, pulse 68, temperature 98.1 F (36.7 C), temperature source Temporal, resp. rate 14, height 5' 1" (1.549 m), weight 143 lb (64.864 kg).  Physical Exam Physical Exam  Vitals reviewed. Constitutional: She appears well-developed and well-nourished.  Eyes: No scleral icterus.  Cardiovascular: Normal rate, regular rhythm and normal heart sounds.   Pulmonary/Chest: Effort normal and breath sounds normal. She has no wheezes. She has no rales. Right breast exhibits no inverted nipple, no mass, no nipple discharge, no skin change and no tenderness. Left breast exhibits no inverted nipple, no mass, no nipple discharge, no skin change and no tenderness.    Abdominal: Soft. There is no hepatomegaly. There is no tenderness.  Lymphadenopathy:    She has no cervical adenopathy.    She has no axillary adenopathy.       Right: No supraclavicular adenopathy present.       Left: No supraclavicular adenopathy present.    Data Reviewed BILATERAL BREAST MRI WITH AND WITHOUT CONTRAST  Technique: Multiplanar, multisequence MR images of both breasts  were obtained prior to and following the intravenous administration  of 13ml of Multihance.  THREE-DIMENSIONAL MR IMAGE RENDERING ON INDEPENDENT WORKSTATION:  Three-dimensional MR images were  rendered by post-processing of  the original MR data on an independent DynaCad workstation. The  three-dimensional MR images were interpreted, and findings are  reported in the following complete MRI report for this study.  Comparison: Recent imaging examinations.  FINDINGS:  Breast composition: c: Heterogeneous fibroglandular tissue  Background parenchymal enhancement: Marked multinodular bilateral  parenchymal background enhancement is present.  Right breast: No worrisome enhancement. There are multiple small  cysts.  Left breast: There is no worrisome enhancement. Post biopsy  change with central clip artifact is present within the superior  portion of the left breast centrally   located in the area of the  patient's recent stereotactic core biopsy. There are multiple small  cysts.  Lymph nodes: No abnormal appearing lymph nodes.  Ancillary findings: None.  IMPRESSION:  Post biopsy change in the area patient's recent left breast  stereotactic core biopsy. No worrisome areas of enhancement within  either breast and no evidence for adenopathy.    Assessment    Left breast DCIS    Plan    Left breast wire guided lumpectomy   We discussed the staging and pathophysiology of breast cancer. We discussed all of the different options for treatment for breast cancer including surgery, chemotherapy, radiation therapy, Herceptin, and antiestrogen therapy. We discussed that right now she has stage 0 breast cancer and I dont think we need to do sentinel node at this point.  We discussed that there may be invasive cancer at surgery and we might need to do node biopsy afterwards.  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  We also discussed all adjuvant treatments would be based on final pathology. We discussed the options for treatment of the breast cancer which included lumpectomy versus a mastectomy. We discussed the performance of  the lumpectomy with a wire placement. We discussed up to a 10% chance of a positive margin requiring reexcision in the operating room. We also discussed that she may need radiation therapy or antiestrogen therapy or both if she undergoes lumpectomy. We discussed the mastectomy and the postoperative care for that as well. We discussed that there is no difference in her survival whether she undergoes lumpectomy with radiation therapy or antiestrogen therapy versus a mastectomy. There is a similar local recurrence rate for both options. We discussed the risks of operation including bleeding, infection, possible reoperation. She understands her further therapy will be based on what her stages at the time of her operation.          Ellias Mcelreath 06/24/2013, 10:11 AM    

## 2013-07-02 ENCOUNTER — Encounter (HOSPITAL_BASED_OUTPATIENT_CLINIC_OR_DEPARTMENT_OTHER): Payer: Self-pay | Admitting: *Deleted

## 2013-07-02 NOTE — Progress Notes (Addendum)
Bring all medications.pt plans to go to [facility] patient lab area on Tuesday, Sept 2 nd for BMET and EKG. Faxed Request to [facility] for labs and EKG.

## 2013-07-08 ENCOUNTER — Encounter (HOSPITAL_COMMUNITY)
Admission: RE | Admit: 2013-07-08 | Discharge: 2013-07-08 | Disposition: A | Discharge status: Home / Self Care | Payer: BC Managed Care – PPO | Source: Ambulatory Visit | Attending: General Surgery | Admitting: General Surgery

## 2013-07-08 ENCOUNTER — Other Ambulatory Visit: Payer: Self-pay

## 2013-07-08 LAB — BASIC METABOLIC PANEL
BUN: 14 mg/dL (ref 6–23)
Chloride: 105 mEq/L (ref 96–112)
GFR calc Af Amer: >90 mL/min (ref >90)
Potassium: 4.1 mEq/L (ref 3.5–5.1)
Sodium: 142 mEq/L (ref 135–145)

## 2013-07-11 ENCOUNTER — Encounter (HOSPITAL_BASED_OUTPATIENT_CLINIC_OR_DEPARTMENT_OTHER): Payer: Self-pay | Admitting: *Deleted

## 2013-07-11 ENCOUNTER — Encounter (HOSPITAL_BASED_OUTPATIENT_CLINIC_OR_DEPARTMENT_OTHER)
Admission: RE | Disposition: A | Discharge status: Home / Self Care | Payer: Self-pay | Source: Ambulatory Visit | Attending: General Surgery

## 2013-07-11 ENCOUNTER — Ambulatory Visit (HOSPITAL_BASED_OUTPATIENT_CLINIC_OR_DEPARTMENT_OTHER): Payer: BC Managed Care – PPO | Admitting: *Deleted

## 2013-07-11 ENCOUNTER — Ambulatory Visit (HOSPITAL_BASED_OUTPATIENT_CLINIC_OR_DEPARTMENT_OTHER)
Admission: RE | Admit: 2013-07-11 | Discharge: 2013-07-11 | Disposition: A | Discharge status: Home / Self Care | Payer: BC Managed Care – PPO | Source: Ambulatory Visit | Attending: General Surgery | Admitting: General Surgery

## 2013-07-11 ENCOUNTER — Ambulatory Visit
Admission: RE | Admit: 2013-07-11 | Discharge: 2013-07-11 | Disposition: A | Discharge status: Home / Self Care | Payer: BC Managed Care – PPO | Source: Ambulatory Visit | Attending: General Surgery | Admitting: General Surgery

## 2013-07-11 DIAGNOSIS — D0590 Unspecified type of carcinoma in situ of unspecified breast: Secondary | ICD-10-CM | POA: Insufficient documentation

## 2013-07-11 DIAGNOSIS — Z170 Estrogen receptor positive status [ER+]: Secondary | ICD-10-CM | POA: Insufficient documentation

## 2013-07-11 DIAGNOSIS — K219 Gastro-esophageal reflux disease without esophagitis: Secondary | ICD-10-CM | POA: Insufficient documentation

## 2013-07-11 DIAGNOSIS — M129 Arthropathy, unspecified: Secondary | ICD-10-CM | POA: Insufficient documentation

## 2013-07-11 DIAGNOSIS — C50412 Malignant neoplasm of upper-outer quadrant of left female breast: Secondary | ICD-10-CM

## 2013-07-11 DIAGNOSIS — F411 Generalized anxiety disorder: Secondary | ICD-10-CM | POA: Insufficient documentation

## 2013-07-11 DIAGNOSIS — I10 Essential (primary) hypertension: Secondary | ICD-10-CM | POA: Insufficient documentation

## 2013-07-11 DIAGNOSIS — Z79899 Other long term (current) drug therapy: Secondary | ICD-10-CM | POA: Insufficient documentation

## 2013-07-11 DIAGNOSIS — E7800 Pure hypercholesterolemia, unspecified: Secondary | ICD-10-CM | POA: Insufficient documentation

## 2013-07-11 DIAGNOSIS — Z7982 Long term (current) use of aspirin: Secondary | ICD-10-CM | POA: Insufficient documentation

## 2013-07-11 HISTORY — DX: Headache: R51

## 2013-07-11 HISTORY — PX: BREAST LUMPECTOMY WITH NEEDLE LOCALIZATION: SHX5759

## 2013-07-11 HISTORY — DX: Unspecified urinary incontinence: R32

## 2013-07-11 LAB — POCT HEMOGLOBIN-HEMACUE: Hemoglobin: 12.8 g/dL (ref 12.0–15.0)

## 2013-07-11 SURGERY — BREAST LUMPECTOMY WITH NEEDLE LOCALIZATION
Anesthesia: General | Site: Breast | Laterality: Left | Wound class: Clean

## 2013-07-11 MED ORDER — EPHEDRINE SULFATE 50 MG/ML IJ SOLN
INTRAMUSCULAR | Status: DC | PRN
  Administered 2013-07-11 (×2): 5 mg via INTRAVENOUS
  Administered 2013-07-11: 10 mg via INTRAVENOUS

## 2013-07-11 MED ORDER — CEFAZOLIN SODIUM-DEXTROSE 2-3 GM-% IV SOLR
2.0000 g | INTRAVENOUS | Status: AC
  Administered 2013-07-11: 2 g via INTRAVENOUS

## 2013-07-11 MED ORDER — FENTANYL CITRATE 0.05 MG/ML IJ SOLN: 50.0000 ug | INTRAMUSCULAR | Status: DC | PRN

## 2013-07-11 MED ORDER — LACTATED RINGERS IV SOLN
INTRAVENOUS | Status: DC
  Administered 2013-07-11 (×2): via INTRAVENOUS

## 2013-07-11 MED ORDER — OXYCODONE-ACETAMINOPHEN 10-325 MG PO TABS: 1.0000 | ORAL_TABLET | Freq: Four times a day (QID) | ORAL | Status: DC | PRN

## 2013-07-11 MED ORDER — FENTANYL CITRATE 0.05 MG/ML IJ SOLN
INTRAMUSCULAR | Status: DC | PRN
  Administered 2013-07-11: 100 ug via INTRAVENOUS

## 2013-07-11 MED ORDER — DEXAMETHASONE SODIUM PHOSPHATE 4 MG/ML IJ SOLN
INTRAMUSCULAR | Status: DC | PRN
  Administered 2013-07-11: 10 mg via INTRAVENOUS

## 2013-07-11 MED ORDER — PROPOFOL 10 MG/ML IV BOLUS
INTRAVENOUS | Status: DC | PRN
  Administered 2013-07-11: 150 mg via INTRAVENOUS
  Administered 2013-07-11: 50 mg via INTRAVENOUS

## 2013-07-11 MED ORDER — BUPIVACAINE HCL (PF) 0.25 % IJ SOLN
INTRAMUSCULAR | Status: DC | PRN
  Administered 2013-07-11: 20 mL

## 2013-07-11 MED ORDER — ONDANSETRON HCL 4 MG/2ML IJ SOLN
INTRAMUSCULAR | Status: DC | PRN
  Administered 2013-07-11: 4 mg via INTRAVENOUS

## 2013-07-11 MED ORDER — LIDOCAINE HCL (CARDIAC) 20 MG/ML IV SOLN
INTRAVENOUS | Status: DC | PRN
  Administered 2013-07-11: 40 mg via INTRAVENOUS

## 2013-07-11 MED ORDER — MIDAZOLAM HCL 2 MG/2ML IJ SOLN: 1.0000 mg | INTRAMUSCULAR | Status: DC | PRN

## 2013-07-11 SURGICAL SUPPLY — 57 items
ADH SKN CLS APL DERMABOND .7 (GAUZE/BANDAGES/DRESSINGS) ×1
APL SKNCLS STERI-STRIP NONHPOA (GAUZE/BANDAGES/DRESSINGS)
APPLIER CLIP 9.375 MED OPEN (MISCELLANEOUS) ×2
APR CLP MED 9.3 20 MLT OPN (MISCELLANEOUS) ×1
BENZOIN TINCTURE PRP APPL 2/3 (GAUZE/BANDAGES/DRESSINGS) IMPLANT
BINDER BREAST LRG (GAUZE/BANDAGES/DRESSINGS) IMPLANT
BINDER BREAST MEDIUM (GAUZE/BANDAGES/DRESSINGS) IMPLANT
BINDER BREAST XLRG (GAUZE/BANDAGES/DRESSINGS) ×1 IMPLANT
BINDER BREAST XXLRG (GAUZE/BANDAGES/DRESSINGS) IMPLANT
BLADE SURG 15 STRL LF DISP TIS (BLADE) ×1 IMPLANT
BLADE SURG 15 STRL SS (BLADE) ×2
CANISTER SUCTION 1200CC (MISCELLANEOUS) ×1 IMPLANT
CHLORAPREP W/TINT 26ML (MISCELLANEOUS) ×2 IMPLANT
CLIP APPLIE 9.375 MED OPEN (MISCELLANEOUS) IMPLANT
CLOTH BEACON ORANGE TIMEOUT ST (SAFETY) ×2 IMPLANT
COVER MAYO STAND STRL (DRAPES) ×2 IMPLANT
COVER TABLE BACK 60X90 (DRAPES) ×2 IMPLANT
DECANTER SPIKE VIAL GLASS SM (MISCELLANEOUS) IMPLANT
DERMABOND ADVANCED (GAUZE/BANDAGES/DRESSINGS) ×1
DERMABOND ADVANCED .7 DNX12 (GAUZE/BANDAGES/DRESSINGS) IMPLANT
DEVICE DUBIN W/COMP PLATE 8390 (MISCELLANEOUS) ×1 IMPLANT
DRAPE PED LAPAROTOMY (DRAPES) ×2 IMPLANT
DRSG TEGADERM 4X4.75 (GAUZE/BANDAGES/DRESSINGS) ×2 IMPLANT
ELECT COATED BLADE 2.86 ST (ELECTRODE) ×2 IMPLANT
ELECT REM PT RETURN 9FT ADLT (ELECTROSURGICAL) ×2
ELECTRODE REM PT RTRN 9FT ADLT (ELECTROSURGICAL) ×1 IMPLANT
GAUZE SPONGE 4X4 12PLY STRL LF (GAUZE/BANDAGES/DRESSINGS) ×2 IMPLANT
GLOVE BIO SURGEON STRL SZ7 (GLOVE) ×2 IMPLANT
GLOVE BIOGEL M STRL SZ7.5 (GLOVE) ×2 IMPLANT
GLOVE BIOGEL PI IND STRL 7.5 (GLOVE) ×1 IMPLANT
GLOVE BIOGEL PI INDICATOR 7.5 (GLOVE) ×1
GLOVE EXAM NITRILE MD LF STRL (GLOVE) ×1 IMPLANT
GOWN PREVENTION PLUS XLARGE (GOWN DISPOSABLE) ×2 IMPLANT
KIT MARKER MARGIN INK (KITS) ×1 IMPLANT
NDL HYPO 25X1 1.5 SAFETY (NEEDLE) ×1 IMPLANT
NEEDLE HYPO 25X1 1.5 SAFETY (NEEDLE) ×2 IMPLANT
NS IRRIG 1000ML POUR BTL (IV SOLUTION) IMPLANT
PACK BASIN DAY SURGERY FS (CUSTOM PROCEDURE TRAY) ×2 IMPLANT
PENCIL BUTTON HOLSTER BLD 10FT (ELECTRODE) ×2 IMPLANT
SLEEVE SCD COMPRESS KNEE MED (MISCELLANEOUS) ×2 IMPLANT
SPONGE LAP 4X18 X RAY DECT (DISPOSABLE) ×2 IMPLANT
STRIP CLOSURE SKIN 1/2X4 (GAUZE/BANDAGES/DRESSINGS) ×1 IMPLANT
SUT MNCRL AB 4-0 PS2 18 (SUTURE) ×1 IMPLANT
SUT MON AB 5-0 PS2 18 (SUTURE) IMPLANT
SUT SILK 2 0 SH (SUTURE) ×2 IMPLANT
SUT VIC AB 2-0 SH 27 (SUTURE) ×2
SUT VIC AB 2-0 SH 27XBRD (SUTURE) ×1 IMPLANT
SUT VIC AB 3-0 SH 27 (SUTURE) ×2
SUT VIC AB 3-0 SH 27X BRD (SUTURE) ×1 IMPLANT
SUT VIC AB 5-0 PS2 18 (SUTURE) IMPLANT
SUT VICRYL AB 3 0 TIES (SUTURE) IMPLANT
SUT VICRYL RAPIDE 4/0 PS 2 (SUTURE) IMPLANT
SYR CONTROL 10ML LL (SYRINGE) ×2 IMPLANT
TOWEL OR 17X24 6PK STRL BLUE (TOWEL DISPOSABLE) ×2 IMPLANT
TOWEL OR NON WOVEN STRL DISP B (DISPOSABLE) ×2 IMPLANT
TUBE CONNECTING 20X1/4 (TUBING) ×1 IMPLANT
YANKAUER SUCT BULB TIP NO VENT (SUCTIONS) ×1 IMPLANT

## 2013-07-11 NOTE — Transfer of Care (Signed)
Immediate Anesthesia Transfer of Care Note  Patient: Cynthia Ferguson  Procedure(s) Performed: Procedure(s) with comments: BREAST LUMPECTOMY WITH NEEDLE LOCALIZATION (Left) - needle localization at BCG 11:00   Patient Location: PACU  Anesthesia Type:General  Level of Consciousness: awake, sedated and responds to stimulation  Airway & Oxygen Therapy: Patient Spontanous Breathing and Patient connected to face mask oxygen  Post-op Assessment: Report given to PACU RN, Post -op Vital signs reviewed and stable and Patient moving all extremities  Post vital signs: Reviewed and stable  Complications: No apparent anesthesia complications

## 2013-07-11 NOTE — Anesthesia Postprocedure Evaluation (Signed)
  Anesthesia Post-op Note  Patient: Cynthia Ferguson  Procedure(s) Performed: Procedure(s) with comments: BREAST LUMPECTOMY WITH NEEDLE LOCALIZATION (Left) - needle localization at BCG 11:00   Patient Location: PACU  Anesthesia Type:General  Level of Consciousness: awake, alert  and oriented  Airway and Oxygen Therapy: Patient Spontanous Breathing  Post-op Pain: mild  Post-op Assessment: Post-op Vital signs reviewed, Respiratory Function Stable, Patent Airway and Pain level controlled  Post-op Vital Signs: stable  Complications: No apparent anesthesia complications

## 2013-07-11 NOTE — Op Note (Signed)
Preoperative diagnosis: Left breast DCIS Postoperative diagnosis: same as above Procedure: Left breast wire guided lumpectomy Surgeon: Dr Harden Mo Anesthesia: General EBL: minimal Specimen: left breast marked with paint Drains: none Complications: none Sponge and needle count correct times two Disposition to recovery stable  Indications: This is a 50 yof who presents after undergoing a screening mammogram with left breast microcalcifications that underwent biopsy showing dcis.  We discussed all her options and have decided to proceed with lumpectomy.  Procedure: After informed consent was obtained the patient was taken to the operating room.  She was given 2 grams of iv cefazolin.  SCDs were in place on her lower extremities.  She was placed under general anesthesia.  Her left breast was then prepped and draped in the standard sterile surgical fashion.  A surgical timeout was then performed.  I made a periareolar incision and pulled the wire in from remotely.  I then removed the breast tissue surrounding the wire with an attempt to get a clear margin.  I then marked this with paint.  I placed clips in the cavity to mark this.  This was then passed off the table as a specimen.  Faxitron mammogram was then taken confirming removal of clip, calcifications and the wire.  This was confirmed by radiology. I then closed the cavity with 2-0 vicryl.  The dermis was closed with 3-0 vicryl and the skin was closed with 4-0 monocryl.  Dermabond and steristrips were applied.  Marcaine was infiltrated at completion.  A breast binder was placed.  She tolerated this well, was extubated and transferred to recovery stable.

## 2013-07-11 NOTE — Interval H&P Note (Signed)
History and Physical Interval Note:  07/11/2013 12:31 PM  Cynthia Ferguson  has presented today for surgery, with the diagnosis of left breast DCIS   The various methods of treatment have been discussed with the patient and family. After consideration of risks, benefits and other options for treatment, the patient has consented to  Procedure(s) with comments: BREAST LUMPECTOMY WITH NEEDLE LOCALIZATION (Left) - needle localization at BCG 11:00  as a surgical intervention .  The patient's history has been reviewed, patient examined, no change in status, stable for surgery.  I have reviewed the patient's chart and labs.  Questions were answered to the patient's satisfaction.     Reinhardt Licausi

## 2013-07-11 NOTE — H&P (View-Only) (Signed)
Patient ID: Cynthia Ferguson, female   DOB: [birth year], 57 y.o.   MRN: [id number]  Chief Complaint  Patient presents with  . New Evaluation    eval Left br DCIS    HPI Cynthia Ferguson is a 57 y.o. female.  Referred by Dr Rosalie Gums HPI 67 yof who underwent routine annual screening mm and was found to have a 5 mm area of calcifications in the upper central portion of the left breast.  She has undergone MR that shows postbiopsy change but no other left breast abnormaloities, no right breast abnormalities and no abnormal nodes.  She underwent stereotactic biopsy with pathology showing grade II to III, er pos at 67%, pr pos at 4% DCIS.  She presents today without any complaints referable to her breasts to discuss options. She has prior excisional biopsy on the left breast that was benign.  Past Medical History  Diagnosis Date  . Anxiety   . High cholesterol   . Hypertension   . Arthritis   . GERD (gastroesophageal reflux disease)     Past Surgical History  Procedure Laterality Date  . Breast surgery      LEFT BREAST LUMPECTOMY - BENIGN  . Bunionectomy  1993    LEFT FOOT  . Abdominal hysterectomy      COMPLETE   . Neck surgery  2002    DISC REPLACED IN NECK  . Bunionectomy Right NOVEMBER 2013    HAMMER TOE    Family History  Problem Relation Age of Onset  . Diabetes Mother   . Hypertension Mother   . Heart disease Mother   . Hypertension Father   . Heart disease Father   . Cancer Father     LUNG, LIVER  . Hypertension Sister   . Hypertension Brother   . Hypertension Sister   . Cancer Maternal Aunt     breast  . Cancer Paternal Aunt     breast    Social History History  Substance Use Topics  . Smoking status: Never Smoker   . Smokeless tobacco: Never Used  . Alcohol Use: No    No Known Allergies  Current Outpatient Prescriptions  Medication Sig Dispense Refill  . acidophilus (RISAQUAD) CAPS capsule Take by mouth daily.      Marland Kitchen ALPRAZolam (XANAX) 0.5 MG tablet Take  0.5 mg by mouth 2 (two) times daily. As needed for sleep and anxiety      . aspirin-acetaminophen-caffeine (EXCEDRIN MIGRAINE) 250-250-65 MG per tablet Take 2 tablets by mouth as needed.      . calcium-vitamin D (OSCAL) 250-125 MG-UNIT per tablet Take 1 tablet by mouth daily.      . Cranberry-Vitamin C-Probiotic (AZO CRANBERRY) 250-30 MG TABS Take by mouth.      . losartan (COZAAR) 100 MG tablet Take 100 mg by mouth daily.      . Magnesium 250 MG TABS Take by mouth.      . multivitamin-lutein (OCUVITE-LUTEIN) CAPS capsule Take 1 capsule by mouth daily.      . nitrofurantoin (MACRODANTIN) 100 MG capsule Take 100 mg by mouth daily.      . nitrofurantoin, macrocrystal-monohydrate, (MACROBID) 100 MG capsule Take 100 mg by mouth daily.      Marland Kitchen omeprazole (PRILOSEC) 20 MG capsule Take 20 mg by mouth daily.      . pentazocine-acetaminophen (TALACEN) 25-650 MG TABS Take 1 tablet by mouth as needed.      . polyethylene glycol (MIRALAX / GLYCOLAX) packet Take 17 g  by mouth as needed.      . simvastatin (ZOCOR) 10 MG tablet Take 10 mg by mouth at bedtime.      Marland Kitchen aspirin 81 MG tablet Take 81 mg by mouth daily.      Marland Kitchen conjugated estrogens (PREMARIN) vaginal cream Place vaginally daily. Insert 1/3 applicatorful 3 x a week for 6 weeks then twice a week      . diclofenac (VOLTAREN) 50 MG EC tablet Take 50 mg by mouth daily.      . fesoterodine (TOVIAZ) 4 MG TB24 Take 4 mg by mouth daily.      . mirabegron ER (MYRBETRIQ) 50 MG TB24 Take 50 mg by mouth daily.      . NONFORMULARY OR COMPOUNDED ITEM Estradiol .02% 1 ML Prefilled Applicator Sig: apply vaginally twice a week #90 Day Supply with 4 refills  1 each  4   No current facility-administered medications for this visit.    Review of Systems Review of Systems  Constitutional: Negative for fever, chills and unexpected weight change.  HENT: Negative for hearing loss, congestion, sore throat, trouble swallowing and voice change.   Eyes: Negative for visual  disturbance.  Respiratory: Negative for cough and wheezing.   Cardiovascular: Negative for chest pain, palpitations and leg swelling.  Gastrointestinal: Positive for constipation. Negative for nausea, vomiting, abdominal pain, diarrhea, blood in stool, abdominal distention and anal bleeding.  Genitourinary: Negative for hematuria, vaginal bleeding and difficulty urinating.  Musculoskeletal: Positive for arthralgias.  Skin: Negative for rash and wound.  Neurological: Positive for headaches. Negative for seizures and syncope.  Hematological: Negative for adenopathy. Does not bruise/bleed easily.  Psychiatric/Behavioral: Negative for confusion.    Blood pressure 116/72, pulse 68, temperature 98.1 F (36.7 C), temperature source Temporal, resp. rate 14, height 5\' 1"  (1.549 m), weight 143 lb (64.864 kg).  Physical Exam Physical Exam  Vitals reviewed. Constitutional: She appears well-developed and well-nourished.  Eyes: No scleral icterus.  Cardiovascular: Normal rate, regular rhythm and normal heart sounds.   Pulmonary/Chest: Effort normal and breath sounds normal. She has no wheezes. She has no rales. Right breast exhibits no inverted nipple, no mass, no nipple discharge, no skin change and no tenderness. Left breast exhibits no inverted nipple, no mass, no nipple discharge, no skin change and no tenderness.    Abdominal: Soft. There is no hepatomegaly. There is no tenderness.  Lymphadenopathy:    She has no cervical adenopathy.    She has no axillary adenopathy.       Right: No supraclavicular adenopathy present.       Left: No supraclavicular adenopathy present.    Data Reviewed BILATERAL BREAST MRI WITH AND WITHOUT CONTRAST  Technique: Multiplanar, multisequence MR images of both breasts  were obtained prior to and following the intravenous administration  of 13ml of Multihance.  THREE-DIMENSIONAL MR IMAGE RENDERING ON INDEPENDENT WORKSTATION:  Three-dimensional MR images were  rendered by post-processing of  the original MR data on an independent DynaCad workstation. The  three-dimensional MR images were interpreted, and findings are  reported in the following complete MRI report for this study.  Comparison: Recent imaging examinations.  FINDINGS:  Breast composition: c: Heterogeneous fibroglandular tissue  Background parenchymal enhancement: Marked multinodular bilateral  parenchymal background enhancement is present.  Right breast: No worrisome enhancement. There are multiple small  cysts.  Left breast: There is no worrisome enhancement. Post biopsy  change with central clip artifact is present within the superior  portion of the left breast centrally  located in the area of the  patient's recent stereotactic core biopsy. There are multiple small  cysts.  Lymph nodes: No abnormal appearing lymph nodes.  Ancillary findings: None.  IMPRESSION:  Post biopsy change in the area patient's recent left breast  stereotactic core biopsy. No worrisome areas of enhancement within  either breast and no evidence for adenopathy.    Assessment    Left breast DCIS    Plan    Left breast wire guided lumpectomy   We discussed the staging and pathophysiology of breast cancer. We discussed all of the different options for treatment for breast cancer including surgery, chemotherapy, radiation therapy, Herceptin, and antiestrogen therapy. We discussed that right now she has stage 0 breast cancer and I dont think we need to do sentinel node at this point.  We discussed that there may be invasive cancer at surgery and we might need to do node biopsy afterwards.  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  We also discussed all adjuvant treatments would be based on final pathology. We discussed the options for treatment of the breast cancer which included lumpectomy versus a mastectomy. We discussed the performance of  the lumpectomy with a wire placement. We discussed up to a 10% chance of a positive margin requiring reexcision in the operating room. We also discussed that she may need radiation therapy or antiestrogen therapy or both if she undergoes lumpectomy. We discussed the mastectomy and the postoperative care for that as well. We discussed that there is no difference in her survival whether she undergoes lumpectomy with radiation therapy or antiestrogen therapy versus a mastectomy. There is a similar local recurrence rate for both options. We discussed the risks of operation including bleeding, infection, possible reoperation. She understands her further therapy will be based on what her stages at the time of her operation.          Ernesto Lashway 06/24/2013, 10:11 AM

## 2013-07-11 NOTE — Anesthesia Preprocedure Evaluation (Signed)
Anesthesia Evaluation Anesthesia Physical Anesthesia Plan  ASA: II  Anesthesia Plan:    Post-op Pain Management:    Induction:   Airway Management Planned:   Additional Equipment:   Intra-op Plan:   Post-operative Plan:   Informed Consent:   Plan Discussed with:   Anesthesia Plan Comments:         Anesthesia Quick Evaluation  

## 2013-07-11 NOTE — Anesthesia Procedure Notes (Signed)
Procedure Name: LMA Insertion Date/Time: 07/11/2013 1:04 PM Performed by: Meyer Russel Pre-anesthesia Checklist: Patient identified, Emergency Drugs available, Suction available and Patient being monitored Patient Re-evaluated:Patient Re-evaluated prior to inductionOxygen Delivery Method: Circle System Utilized Preoxygenation: Pre-oxygenation with 100% oxygen Intubation Type: IV induction Ventilation: Mask ventilation without difficulty LMA: LMA inserted LMA Size: 4.0 Number of attempts: 1 Airway Equipment and Method: bite block Placement Confirmation: positive ETCO2 and breath sounds checked- equal and bilateral Tube secured with: Tape Dental Injury: Teeth and Oropharynx as per pre-operative assessment

## 2013-07-14 ENCOUNTER — Encounter (HOSPITAL_BASED_OUTPATIENT_CLINIC_OR_DEPARTMENT_OTHER): Payer: Self-pay | Admitting: General Surgery

## 2013-07-15 ENCOUNTER — Other Ambulatory Visit (INDEPENDENT_AMBULATORY_CARE_PROVIDER_SITE_OTHER): Payer: Self-pay | Admitting: General Surgery

## 2013-07-15 DIAGNOSIS — D0510 Intraductal carcinoma in situ of unspecified breast: Secondary | ICD-10-CM | POA: Insufficient documentation

## 2013-07-15 DIAGNOSIS — D0512 Intraductal carcinoma in situ of left breast: Secondary | ICD-10-CM

## 2013-07-21 ENCOUNTER — Other Ambulatory Visit: Payer: Self-pay | Admitting: *Deleted

## 2013-07-21 ENCOUNTER — Telehealth: Payer: Self-pay | Admitting: *Deleted

## 2013-07-21 ENCOUNTER — Other Ambulatory Visit: Payer: Self-pay | Admitting: Emergency Medicine

## 2013-07-21 DIAGNOSIS — C50112 Malignant neoplasm of central portion of left female breast: Secondary | ICD-10-CM | POA: Insufficient documentation

## 2013-07-21 NOTE — Telephone Encounter (Signed)
Confirmed 07/22/13 appt w/ pt.  Unable to mail before appt letter, gave verbal.  Placed note for [facility] to give pt packet at time of check in.  Emailed Dr. Dwain Sarna at [facility] to make him aware.  Made chart.

## 2013-07-22 ENCOUNTER — Ambulatory Visit (HOSPITAL_BASED_OUTPATIENT_CLINIC_OR_DEPARTMENT_OTHER): Payer: BC Managed Care – PPO | Admitting: Oncology

## 2013-07-22 ENCOUNTER — Encounter: Payer: Self-pay | Admitting: Oncology

## 2013-07-22 ENCOUNTER — Ambulatory Visit (HOSPITAL_BASED_OUTPATIENT_CLINIC_OR_DEPARTMENT_OTHER): Payer: BC Managed Care – PPO

## 2013-07-22 ENCOUNTER — Other Ambulatory Visit (HOSPITAL_BASED_OUTPATIENT_CLINIC_OR_DEPARTMENT_OTHER): Payer: BC Managed Care – PPO | Admitting: Lab

## 2013-07-22 VITALS — BP 125/76 | HR 90 | Temp 98.0°F | Resp 20 | Ht 61.0 in | Wt 145.4 lb

## 2013-07-22 DIAGNOSIS — C50119 Malignant neoplasm of central portion of unspecified female breast: Secondary | ICD-10-CM

## 2013-07-22 DIAGNOSIS — C50919 Malignant neoplasm of unspecified site of unspecified female breast: Secondary | ICD-10-CM

## 2013-07-22 DIAGNOSIS — D0512 Intraductal carcinoma in situ of left breast: Secondary | ICD-10-CM

## 2013-07-22 DIAGNOSIS — C50112 Malignant neoplasm of central portion of left female breast: Secondary | ICD-10-CM

## 2013-07-22 LAB — CBC WITH DIFFERENTIAL/PLATELET
BASO%: 0.4 % (ref 0.0–2.0)
Eosinophils Absolute: 0.1 10*3/uL (ref 0.0–0.5)
HCT: 36.8 % (ref 34.8–46.6)
HGB: 12.6 g/dL (ref 11.6–15.9)
LYMPH%: 42.2 % (ref 14.0–49.7)
MCHC: 34.4 g/dL (ref 31.5–36.0)
MONO#: 0.5 10*3/uL (ref 0.1–0.9)
NEUT#: 3.6 10*3/uL (ref 1.5–6.5)
NEUT%: 49.1 % (ref 38.4–76.8)
Platelets: 250 10*3/uL (ref 145–400)
WBC: 7.2 10*3/uL (ref 3.9–10.3)
lymph#: 3 10*3/uL (ref 0.9–3.3)

## 2013-07-22 LAB — COMPREHENSIVE METABOLIC PANEL (CC13)
ALT: 21 U/L (ref 0–55)
CO2: 29 mEq/L (ref 22–29)
Calcium: 9.4 mg/dL (ref 8.4–10.4)
Chloride: 108 mEq/L (ref 98–109)
Creatinine: 0.7 mg/dL (ref 0.6–1.1)
Glucose: 104 mg/dl (ref 70–140)
Total Protein: 6.9 g/dL (ref 6.4–8.3)

## 2013-07-22 NOTE — Patient Instructions (Addendum)
We discussed your radiology and pathology today. We discussed her diagnosis of ductal carcinoma in situ (stage 0).  We discussed treatment options. You have had a lumpectomy. She will now go on to receive post lumpectomy radiation.  We discussed adjuvant antiestrogen therapy with tamoxifen for 5 years. We discussed the risks benefits and side effects of tamoxifen. This will begin after you complete radiation therapy.  I have set you up to see me back in 2 months time in followup.  Tamoxifen oral tablet What is this medicine? TAMOXIFEN (ta MOX i fen) blocks the effects of estrogen. It is commonly used to treat breast cancer. It is also used to decrease the chance of breast cancer coming back in women who have received treatment for the disease. It may also help prevent breast cancer in women who have a high risk of developing breast cancer. This medicine may be used for other purposes; ask your health care provider or pharmacist if you have questions. What should I tell my health care provider before I take this medicine? They need to know if you have any of these conditions: -blood clots -blood disease -cataracts or impaired eyesight -endometriosis -high calcium levels -high cholesterol -irregular menstrual cycles -liver disease -stroke -uterine fibroids -an unusual or allergic reaction to tamoxifen, other medicines, foods, dyes, or preservatives -pregnant or trying to get pregnant -breast-feeding How should I use this medicine? Take this medicine by mouth with a glass of water. Follow the directions on the prescription label. You can take it with or without food. Take your medicine at regular intervals. Do not take your medicine more often than directed. Do not stop taking except on your doctor's advice. A special MedGuide will be given to you by the pharmacist with each prescription and refill. Be sure to read this information carefully each time. Talk to your pediatrician regarding  the use of this medicine in children. While this drug may be prescribed for selected conditions, precautions do apply. Overdosage: If you think you have taken too much of this medicine contact a poison control center or emergency room at once. NOTE: This medicine is only for you. Do not share this medicine with others. What if I miss a dose? If you miss a dose, take it as soon as you can. If it is almost time for your next dose, take only that dose. Do not take double or extra doses. What may interact with this medicine? -aminoglutethimide -bromocriptine -chemotherapy drugs -female hormones, like estrogens and birth control pills -letrozole -medroxyprogesterone -phenobarbital -rifampin -warfarin This list may not describe all possible interactions. Give your health care provider a list of all the medicines, herbs, non-prescription drugs, or dietary supplements you use. Also tell them if you smoke, drink alcohol, or use illegal drugs. Some items may interact with your medicine. What should I watch for while using this medicine? Visit your doctor or health care professional for regular checks on your progress. You will need regular pelvic exams, breast exams, and mammograms. If you are taking this medicine to reduce your risk of getting breast cancer, you should know that this medicine does not prevent all types of breast cancer. If breast cancer or other problems occur, there is no guarantee that it will be found at an early stage. Do not become pregnant while taking this medicine or for 2 months after stopping this medicine. Stop taking this medicine if you get pregnant or think you are pregnant and contact your doctor. This medicine may harm your  unborn baby. Women who can possibly become pregnant should use birth control methods that do not use hormones during tamoxifen treatment and for 2 months after therapy has stopped. Talk with your health care provider for birth control advice. Do not  breast feed while taking this medicine. What side effects may I notice from receiving this medicine? Side effects that you should report to your doctor or health care professional as soon as possible: -changes in vision (blurred vision) -changes in your menstrual cycle -difficulty breathing or shortness of breath -difficulty walking or talking -new breast lumps -numbness -pelvic pain or pressure -redness, blistering, peeling or loosening of the skin, including inside the mouth -skin rash or itching (hives) -sudden chest pain -swelling of lips, face, or tongue -swelling, pain or tenderness in your calf or leg -unusual bruising or bleeding -vaginal discharge that is bloody, brown, or rust -weakness -yellowing of the whites of the eyes or skin Side effects that usually do not require medical attention (report to your doctor or health care professional if they continue or are bothersome): -fatigue -hair loss, although uncommon and is usually mild -headache -hot flashes -impotence (in men) -nausea, vomiting (mild) -vaginal discharge (white or clear) This list may not describe all possible side effects. Call your doctor for medical advice about side effects. You may report side effects to FDA at 1-800-FDA-1088. Where should I keep my medicine? Keep out of the reach of children. Store at room temperature between 20 and 25 degrees C (68 and 77 degrees F). Protect from light. Keep container tightly closed. Throw away any unused medicine after the expiration date. NOTE: This sheet is a summary. It may not cover all possible information. If you have questions about this medicine, talk to your doctor, pharmacist, or health care provider.  2012, [employer]. (07/09/2008 12:01:56 PM)

## 2013-07-22 NOTE — Progress Notes (Signed)
Cynthia Ferguson [id number] 1955/12/13 57 y.o. 07/22/2013 3:24 PM  CC  Dr. Dione [facility] [address] [place] [address] Dr. Reynaldo Minium Dr. Emelia Loron  REASON FOR CONSULTATION:  57 year old female with new diagnosis of ductal carcinoma in situ of the left breast. Patient is seen in medical oncology for discussion of treatment options.   STAGE:  Breast neoplasm, Tis (DCIS)   Primary site: Breast left breast   Staging method: AJCC 7th Edition   Summary:    REFERRING PHYSICIAN: Dr. Emelia Loron  HISTORY OF PRESENT ILLNESS:  Cynthia Ferguson is a 57 y.o. female.  Underwent a routine screening mammogram and was found to have a 5 mm area of calcifications in the upper central portion of the left breast. She had a biopsy performed that showed grade 2 ER-positive PR-positive DCIS. She had MRI that showed post biopsy changes only and no other abnormalities. Patient was seen by Dr. Emelia Loron on 06/24/2013. He recommended a lumpectomy. She underwent lumpectomy on 07/11/2013 the final pathology revealed a 0.6 cm ductal carcinoma in situ with calcifications high grade margins were negative for carcinoma. Tumor was estrogen receptor +67% progesterone receptor +4%. Lymph nodes were not examined. Postoperatively she is doing well. She is now seen in medical oncology for discussion of adjuvant treatment options. Final staging is Tis NX (stage 0)   Past Medical History: Past Medical History  Diagnosis Date  . Anxiety   . High cholesterol   . Hypertension   . Arthritis   . GERD (gastroesophageal reflux disease)   . Bladder incontinence   . Headache(784.0)     migraines    Past Surgical History: Past Surgical History  Procedure Laterality Date  . Breast surgery      LEFT BREAST LUMPECTOMY - BENIGN  . Bunionectomy  1993    LEFT FOOT  . Abdominal hysterectomy      COMPLETE   . Neck surgery  2002    DISC REPLACED IN NECK  . Bunionectomy Right  NOVEMBER 2013    HAMMER TOE  . Right knee athroscopy      2006  . Tubal ligation    . Breast lumpectomy with needle localization Left 07/11/2013    Procedure: BREAST LUMPECTOMY WITH NEEDLE LOCALIZATION;  Surgeon: Emelia Loron, MD;  Location: [facility];  Service: General;  Laterality: Left;  needle localization at BCG 11:00     Family History: Family History  Problem Relation Age of Onset  . Diabetes Mother   . Hypertension Mother   . Heart disease Mother   . Hypertension Father   . Heart disease Father   . Cancer Father     LUNG, LIVER  . Hypertension Sister   . Hypertension Brother   . Hypertension Sister   . Cancer Maternal Aunt     breast  . Cancer Paternal Aunt     breast    Social History History  Substance Use Topics  . Smoking status: Never Smoker   . Smokeless tobacco: Never Used  . Alcohol Use: No    Allergies: No Known Allergies  Current Medications: Current Outpatient Prescriptions  Medication Sig Dispense Refill  . acetaminophen (TYLENOL) 325 MG tablet Take 650 mg by mouth every 6 (six) hours as needed for pain.      Marland Kitchen acidophilus (RISAQUAD) CAPS capsule Take 1 capsule by mouth daily.       Marland Kitchen ALPRAZolam (XANAX) 0.5 MG tablet Take 0.5 mg by mouth 2 (two)  times daily. As needed for sleep and anxiety      . calcium-vitamin D (OSCAL) 250-125 MG-UNIT per tablet Take 2 tablets by mouth 2 (two) times daily.       . Cranberry-Vitamin C-Probiotic (AZO CRANBERRY) 250-30 MG TABS Take 2 tablets by mouth.       . docusate sodium (COLACE) 100 MG capsule Take 100 mg by mouth 2 (two) times daily.      Marland Kitchen losartan (COZAAR) 100 MG tablet Take 100 mg by mouth daily.      . multivitamin-lutein (OCUVITE-LUTEIN) CAPS capsule Take 1 capsule by mouth daily.      . nitrofurantoin (MACRODANTIN) 100 MG capsule Take 100 mg by mouth at bedtime.       . nitrofurantoin, macrocrystal-monohydrate, (MACROBID) 100 MG capsule       . omeprazole (PRILOSEC) 20 MG capsule  Take 20 mg by mouth daily.      . polyethylene glycol (MIRALAX / GLYCOLAX) packet Take 17 g by mouth as needed.      . simvastatin (ZOCOR) 10 MG tablet Take 10 mg by mouth at bedtime.      Marland Kitchen aspirin-acetaminophen-caffeine (EXCEDRIN MIGRAINE) 250-250-65 MG per tablet Take 2 tablets by mouth as needed.      . diclofenac (VOLTAREN) 50 MG EC tablet Take 50 mg by mouth daily.      . Magnesium 250 MG TABS Take 1 tablet by mouth daily.       Marland Kitchen oxyCODONE-acetaminophen (PERCOCET) 10-325 MG per tablet Take 1 tablet by mouth every 6 (six) hours as needed for pain.  20 tablet  0   No current facility-administered medications for this visit.    OB/GYN History: menarche at 57, menopause surgical hysterectomy total, HRT for 5 years, first live birth at 8, G43P2  Fertility Discussion: N/A Prior History of Cancer: no   Health Maintenance:  Colonoscopy yes 2007 Bone Density yes 6 years ago Last PAP smear 06/2013  ECOG PERFORMANCE STATUS: 0 - Asymptomatic  Genetic Counseling/testing: no  REVIEW OF SYSTEMS:  A comprehensive review of systems was negative.  PHYSICAL EXAMINATION: Blood pressure 125/76, pulse 90, temperature 98 F (36.7 C), temperature source Oral, resp. rate 20, height 5\' 1"  (1.549 m), weight 145 lb 6.4 oz (65.953 kg).  ZOX:WRUEA, healthy, no distress, well nourished and well developed SKIN: skin color, texture, turgor are normal HEAD: Normocephalic EYES: PERRLA, EOMI, Conjunctiva are pink and non-injected EARS: External ears normal OROPHARYNX:no exudate, no erythema and lips, buccal mucosa, and tongue normal  NECK: no adenopathy, thyroid normal size, non-tender, without nodularity LYMPH:  no palpable lymphadenopathy, no hepatosplenomegaly BREAST:right breast normal without mass, skin or nipple changes or axillary nodes, surgical scars noted in the left breast healing very well no evidence of infections or nodularity. LUNGS: clear to auscultation and percussion HEART: regular  rate & rhythm ABDOMEN:abdomen soft and non-tender BACK: Back symmetric, no curvature. EXTREMITIES:no joint deformities, effusion, or inflammation, no clubbing, no cyanosis  NEURO: alert & oriented x 3 with fluent speech, no focal motor/sensory deficits, gait normal     STUDIES/RESULTS: Mm Lt Plc Breast Loc Dev   1st Lesion  Inc Mammo Guide  07/11/2013   *RADIOLOGY REPORT*  Clinical Data: Recent diagnosis of left breast cancer for surgery  NEEDLE LOCALIZATION WITH MAMMOGRAPHIC GUIDANCE AND SPECIMEN RADIOGRAPH  Comparison: Previous exam(s)  Patient presents for needle localization prior to left breast surgery. I met with the patient and we discussed the procedure of needle localization including benefits and alternatives. We  discussed the high likelihood of a successful procedure. We discussed the risks of the procedure, including infection, bleeding, tissue injury, and further surgery. Informed, written consent was given. The usual time-out protocol was performed immediately prior to the procedure.  Using mammographic guidance, sterile technique, 2% lidocaine and a a 7 cm modified Kopans needle, biopsy clip in the left breast localized using a cranial approach. The films are marked for Dr. Dwain Sarna.  Specimen radiograph confirms biopsy clip and wire present in the tissue sample. The specimen is marked for pathology.  IMPRESSION: Needle localization left  breast. No apparent complications.   Original Report Authenticated By: Sherian Rein, M.D.     LABS:    Chemistry      Component Value Date/Time   NA 142 07/08/2013 0915   K 4.1 07/08/2013 0915   CL 105 07/08/2013 0915   CO2 29 07/08/2013 0915   BUN 14 07/08/2013 0915   CREATININE 0.73 07/08/2013 0915      Component Value Date/Time   CALCIUM 9.7 07/08/2013 0915      Lab Results  Component Value Date   WBC 7.2 07/22/2013   HGB 12.6 07/22/2013   HCT 36.8 07/22/2013   MCV 94.3 07/22/2013   PLT 250 07/22/2013   PATHOLOGY: FINAL  DIAGNOSIS Diagnosis Breast, lumpectomy, Left - DUCTAL CARCINOMA IN SITU WITH CALCIFICATIONS, HIGH GRADE, SPANNING 0.6 CM. - THE SURGICAL RESECTION MARGINS ARE NEGATIVE FOR CARCINOMA. - SEE ONCOLOGY TABLE BELOW. Microscopic Comment BREAST, IN SITU CARCINOMA Specimen, including laterality: Left breast. Procedure: Needle localized lumpectomy without lymph nodes. Grade of carcinoma: High grade. Necrosis: Present. Estimated tumor size: (glass slide measurement): 0.6 cm Treatment effect: N/A Distance to closest margin: Focally 0.2 cm to the inferior margin (glass slide measurement). Breast prognostic profile: [phone] Estrogen receptor: 67%, strong staining intensity. Progesterone receptor: 4%, strong staining intensity. Lymph nodes: None examined. TNM: pTis, pNX (JBK:gt, 07/14/13) Pecola Leisure MD Pathologist, Electronic Signature (Case signed 07/14/2013) S ASSESSMENT    57 year old female with  #1 DCIS of the left breast found on screening mammogram as a 5 mm suspicious area patient is now status post lumpectomy with the final pathology revealing a 0.6 cm high-grade ductal carcinoma in situ. Tumor was ER +67% PR +4%. Postoperatively she is doing well there was no evidence of invasive disease.  #2 patient is a good candidate for adjuvant radiation therapy followed by antiestrogen therapy with tamoxifen 20 mg daily. Patient desires to have her radiation performed in [place] and we will make a referral. Once she completes this I will see her back and we will plan on starting her on tamoxifen 20 mg daily for future breast cancer risk in the ipsilateral as well as the contralateral breast. We discussed risks benefits and side effects of treatment.    Clinical Trial Eligibility: no Multidisciplinary conference discussion no     PLAN:    #1 proceed with radiation therapy initially.  #2 I will see her back in about 2 months time for followup and to begin her on tamoxifen         Discussion: Patient is being treated per NCCN breast cancer care guidelines appropriate for stage.0   Thank you so much for allowing me to participate in the care of Nea W Cotta. I will continue to follow up the patient with you and assist in her care.  All questions were answered. The patient knows to call the clinic with any problems, questions or concerns. We can certainly see the patient much  sooner if necessary.  I spent 40 minutes counseling the patient face to face. The total time spent in the appointment was 55 minutes.  Drue Second, MD Medical/Oncology [facility] [phone] (beeper) [phone] (Office)  07/22/2013, 3:31 PM

## 2013-07-23 ENCOUNTER — Telehealth: Payer: Self-pay | Admitting: *Deleted

## 2013-07-23 NOTE — Telephone Encounter (Signed)
sw pt gv appt for 09/29/13@ 2:45pm. Pt is aware...td

## 2013-07-30 ENCOUNTER — Encounter (INDEPENDENT_AMBULATORY_CARE_PROVIDER_SITE_OTHER): Payer: Self-pay | Admitting: General Surgery

## 2013-07-30 ENCOUNTER — Ambulatory Visit (INDEPENDENT_AMBULATORY_CARE_PROVIDER_SITE_OTHER): Payer: BC Managed Care – PPO | Admitting: General Surgery

## 2013-07-30 ENCOUNTER — Encounter (INDEPENDENT_AMBULATORY_CARE_PROVIDER_SITE_OTHER): Payer: Self-pay

## 2013-07-30 VITALS — BP 132/74 | HR 70 | Resp 18 | Ht 61.0 in | Wt 147.0 lb

## 2013-07-30 DIAGNOSIS — Z09 Encounter for follow-up examination after completed treatment for conditions other than malignant neoplasm: Secondary | ICD-10-CM

## 2013-07-30 NOTE — Progress Notes (Signed)
Subjective:     Patient ID: Cynthia Ferguson, female   DOB: April 15, 1956, 57 y.o.   MRN: [id number]  HPI This is a 57 year old female who underwent a left breast lumpectomy with pathology showing ductal carcinoma in situ. Her margins are negative. She returns today without any complaints.  Review of Systems     Objective:   Physical Exam    Periareolar left breast incision without any evidence of infection and healing well Assessment:     Stage 0 left breast cancer     Plan:     She is doing well entirely sure full activity. She is okay to go back to work on Monday. I will plan on seeing her back annually after her mammogram. She is due to begin radiation and will follow up with medical oncology after radiation therapy to discuss antiestrogen therapy.

## 2013-08-18 ENCOUNTER — Encounter: Payer: Self-pay | Admitting: *Deleted

## 2013-08-18 NOTE — Progress Notes (Signed)
Mailed after appt letter to pt. 

## 2013-09-29 ENCOUNTER — Ambulatory Visit (HOSPITAL_BASED_OUTPATIENT_CLINIC_OR_DEPARTMENT_OTHER): Payer: BC Managed Care – PPO | Admitting: Oncology

## 2013-09-29 ENCOUNTER — Telehealth: Payer: Self-pay | Admitting: *Deleted

## 2013-09-29 ENCOUNTER — Encounter: Payer: Self-pay | Admitting: Oncology

## 2013-09-29 VITALS — BP 110/73 | HR 86 | Temp 97.8°F | Resp 18 | Ht 61.0 in | Wt 146.7 lb

## 2013-09-29 DIAGNOSIS — D0590 Unspecified type of carcinoma in situ of unspecified breast: Secondary | ICD-10-CM

## 2013-09-29 DIAGNOSIS — Z170 Estrogen receptor positive status [ER+]: Secondary | ICD-10-CM

## 2013-09-29 DIAGNOSIS — D0512 Intraductal carcinoma in situ of left breast: Secondary | ICD-10-CM

## 2013-09-29 MED ORDER — TAMOXIFEN CITRATE 20 MG PO TABS: 20.0000 mg | ORAL_TABLET | Freq: Every day | ORAL | Status: DC

## 2013-09-29 NOTE — Progress Notes (Signed)
[name] [id number] [birth year] 57 y.o. 09/29/2013 2:50 PM  CC  Dr. Dione [facility] [address] [place] [address] Dr. Reynaldo Minium Dr. Emelia Loron  Diagnosis:  57 year old female with new diagnosis of ductal carcinoma in situ of the left breast. Patient is seen in medical oncology for discussion of treatment options.   STAGE:  Breast neoplasm, Tis (DCIS)   Primary site: Breast left breast   Staging method: AJCC 7th Edition   Summary:    REFERRING PHYSICIAN: Dr. Emelia Loron  Prior oncologic history:  Cynthia Ferguson is a 57 y.o. female.  Underwent a routine screening mammogram and was found to have a 5 mm area of calcifications in the upper central portion of the left breast. She had a biopsy performed that showed grade 2 ER-positive PR-positive DCIS. She had MRI that showed post biopsy changes only and no other abnormalities. Patient was seen by Dr. Emelia Loron on 06/24/2013. He recommended a lumpectomy. She underwent lumpectomy on 07/11/2013 the final pathology revealed a 0.6 cm ductal carcinoma in situ with calcifications high grade margins were negative for carcinoma. Tumor was estrogen receptor +67% progesterone receptor +4%. Lymph nodes were not examined. Postoperatively she is doing well. Patient has completed radiation therapy. She tolerated it well.  Interval history:Patient is seen in followup visit after completing radiation therapy. She has no problems whatsoever. She's healed very nicely from radiation as well as from surgery. She has no fevers chills night sweats headaches shortness of breath chest pains palpitations no myalgias and arthralgias. Remainder of the 10 point review of systems is negative.  Current therapy: tamoxifen 20 mg daily  Past Medical History: Past Medical History  Diagnosis Date  . Anxiety   . High cholesterol   . Hypertension   . Arthritis   . GERD (gastroesophageal reflux disease)   . Bladder  incontinence   . Headache(784.0)     migraines    Past Surgical History: Past Surgical History  Procedure Laterality Date  . Breast surgery      LEFT BREAST LUMPECTOMY - BENIGN  . Bunionectomy  1993    LEFT FOOT  . Abdominal hysterectomy      COMPLETE   . Neck surgery  2002    DISC REPLACED IN NECK  . Bunionectomy Right NOVEMBER 2013    HAMMER TOE  . Right knee athroscopy      2006  . Tubal ligation    . Breast lumpectomy with needle localization Left 07/11/2013    Procedure: BREAST LUMPECTOMY WITH NEEDLE LOCALIZATION;  Surgeon: Emelia Loron, MD;  Location: [facility];  Service: General;  Laterality: Left;  needle localization at BCG 11:00     Family History: Family History  Problem Relation Age of Onset  . Diabetes Mother   . Hypertension Mother   . Heart disease Mother   . Hypertension Father   . Heart disease Father   . Cancer Father     LUNG, LIVER  . Hypertension Sister   . Hypertension Brother   . Hypertension Sister   . Cancer Maternal Aunt     breast  . Cancer Paternal Aunt     breast    Social History History  Substance Use Topics  . Smoking status: Never Smoker   . Smokeless tobacco: Never Used  . Alcohol Use: No    Allergies: No Known Allergies  Current Medications: Current Outpatient Prescriptions  Medication Sig Dispense Refill  . acetaminophen (TYLENOL) 325 MG tablet  Take 650 mg by mouth every 6 (six) hours as needed for pain.      Marland Kitchen acidophilus (RISAQUAD) CAPS capsule Take 1 capsule by mouth daily.       Marland Kitchen ALPRAZolam (XANAX) 0.5 MG tablet Take 0.5 mg by mouth 2 (two) times daily. As needed for sleep and anxiety      . aspirin-acetaminophen-caffeine (EXCEDRIN MIGRAINE) 250-250-65 MG per tablet Take 2 tablets by mouth as needed.      . calcium-vitamin D (OSCAL) 250-125 MG-UNIT per tablet Take 2 tablets by mouth 2 (two) times daily.       . Cranberry-Vitamin C-Probiotic (AZO CRANBERRY) 250-30 MG TABS Take 2 tablets by mouth.        . diclofenac (VOLTAREN) 50 MG EC tablet Take 50 mg by mouth daily.      Marland Kitchen docusate sodium (COLACE) 100 MG capsule Take 100 mg by mouth 2 (two) times daily.      Marland Kitchen losartan (COZAAR) 100 MG tablet Take 100 mg by mouth daily.      . multivitamin-lutein (OCUVITE-LUTEIN) CAPS capsule Take 1 capsule by mouth daily.      . nitrofurantoin (MACRODANTIN) 100 MG capsule Take 100 mg by mouth at bedtime.       Marland Kitchen omeprazole (PRILOSEC) 20 MG capsule Take 20 mg by mouth daily.      . polyethylene glycol (MIRALAX / GLYCOLAX) packet Take 17 g by mouth as needed.      . simvastatin (ZOCOR) 10 MG tablet Take 10 mg by mouth at bedtime.       No current facility-administered medications for this visit.    OB/GYN History: menarche at 66, menopause surgical hysterectomy total, HRT for 5 years, first live birth at 19, G50P2  Fertility Discussion: N/A Prior History of Cancer: no   Health Maintenance:  Colonoscopy yes 2007 Bone Density yes 6 years ago Last PAP smear 06/2013  ECOG PERFORMANCE STATUS: 0 - Asymptomatic  Genetic Counseling/testing: no  REVIEW OF SYSTEMS:  A comprehensive review of systems was negative.  PHYSICAL EXAMINATION: Blood pressure 110/73, pulse 86, temperature 97.8 F (36.6 C), temperature source Oral, resp. rate 18, height 5\' 1"  (1.549 m), weight 146 lb 11.2 oz (66.543 kg).  ZOX:WRUEA, healthy, no distress, well nourished and well developed SKIN: skin color, texture, turgor are normal HEAD: Normocephalic EYES: PERRLA, EOMI, Conjunctiva are pink and non-injected EARS: External ears normal OROPHARYNX:no exudate, no erythema and lips, buccal mucosa, and tongue normal  NECK: no adenopathy, thyroid normal size, non-tender, without nodularity LYMPH:  no palpable lymphadenopathy, no hepatosplenomegaly BREAST:right breast normal without mass, skin or nipple changes or axillary nodes, surgical scars noted in the left breast healing very well no evidence of infections or  nodularity. LUNGS: clear to auscultation and percussion HEART: regular rate & rhythm ABDOMEN:abdomen soft and non-tender BACK: Back symmetric, no curvature. EXTREMITIES:no joint deformities, effusion, or inflammation, no clubbing, no cyanosis  NEURO: alert & oriented x 3 with fluent speech, no focal motor/sensory deficits, gait normal     STUDIES/RESULTS: Mm Lt Plc Breast Loc Dev   1st Lesion  Inc Mammo Guide  07/11/2013   *RADIOLOGY REPORT*  Clinical Data: Recent diagnosis of left breast cancer for surgery  NEEDLE LOCALIZATION WITH MAMMOGRAPHIC GUIDANCE AND SPECIMEN RADIOGRAPH  Comparison: Previous exam(s)  Patient presents for needle localization prior to left breast surgery. I met with the patient and we discussed the procedure of needle localization including benefits and alternatives. We discussed the high likelihood of a successful procedure.  We discussed the risks of the procedure, including infection, bleeding, tissue injury, and further surgery. Informed, written consent was given. The usual time-out protocol was performed immediately prior to the procedure.  Using mammographic guidance, sterile technique, 2% lidocaine and a a 7 cm modified Kopans needle, biopsy clip in the left breast localized using a cranial approach. The films are marked for Dr. Dwain Sarna.  Specimen radiograph confirms biopsy clip and wire present in the tissue sample. The specimen is marked for pathology.  IMPRESSION: Needle localization left  breast. No apparent complications.   Original Report Authenticated By: Sherian Rein, M.D.     LABS:    Chemistry      Component Value Date/Time   NA 144 07/22/2013 1508   NA 142 07/08/2013 0915   K 3.8 07/22/2013 1508   K 4.1 07/08/2013 0915   CL 105 07/08/2013 0915   CO2 29 07/22/2013 1508   CO2 29 07/08/2013 0915   BUN 11.5 07/22/2013 1508   BUN 14 07/08/2013 0915   CREATININE 0.7 07/22/2013 1508   CREATININE 0.73 07/08/2013 0915      Component Value Date/Time   CALCIUM 9.4  07/22/2013 1508   CALCIUM 9.7 07/08/2013 0915   ALKPHOS 61 07/22/2013 1508   AST 16 07/22/2013 1508   ALT 21 07/22/2013 1508   BILITOT 0.33 07/22/2013 1508      Lab Results  Component Value Date   WBC 7.2 07/22/2013   HGB 12.6 07/22/2013   HCT 36.8 07/22/2013   MCV 94.3 07/22/2013   PLT 250 07/22/2013   PATHOLOGY: FINAL DIAGNOSIS Diagnosis Breast, lumpectomy, Left - DUCTAL CARCINOMA IN SITU WITH CALCIFICATIONS, HIGH GRADE, SPANNING 0.6 CM. - THE SURGICAL RESECTION MARGINS ARE NEGATIVE FOR CARCINOMA. - SEE ONCOLOGY TABLE BELOW. Microscopic Comment BREAST, IN SITU CARCINOMA Specimen, including laterality: Left breast. Procedure: Needle localized lumpectomy without lymph nodes. Grade of carcinoma: High grade. Necrosis: Present. Estimated tumor size: (glass slide measurement): 0.6 cm Treatment effect: N/A Distance to closest margin: Focally 0.2 cm to the inferior margin (glass slide measurement). Breast prognostic profile: [phone] Estrogen receptor: 67%, strong staining intensity. Progesterone receptor: 4%, strong staining intensity. Lymph nodes: None examined. TNM: pTis, pNX (JBK:gt, 07/14/13) Pecola Leisure MD Pathologist, Electronic Signature (Case signed 07/14/2013) S ASSESSMENT    57 year old female with  #1 DCIS of the left breast found on screening mammogram as a 5 mm suspicious area patient is now status post lumpectomy with the final pathology revealing a 0.6 cm high-grade ductal carcinoma in situ. Tumor was ER +67% PR +4%. Postoperatively she is doing well there was no evidence of invasive disease.  #2 patient underwent a lumpectomy and of the left breast with the final pathology revealing a DCIS margins were negative. Postoperatively patient did well.  #3 she is completed her radiation therapy.  #4 adjuvant curative intent tamoxifen 20 mg daily. Risks benefits side effects were discussed. Total of 5 years of therapy is planned  PLAN: #1 proceed with tamoxifen 20 mg  daily.  #2 patient will be seen back in 4 months time for followup      Thank you so much for allowing me to participate in the care of Cynthia Ferguson. I will continue to follow up the patient with you and assist in her care.  All questions were answered. The patient knows to call the clinic with any problems, questions or concerns. We can certainly see the patient much sooner if necessary.  I spent 20 minutes counseling the patient face  to face. The total time spent in the appointment was 30 minutes.  Drue Second, MD Medical/Oncology [facility] [phone] (beeper) [phone] (Office)  09/29/2013, 2:50 PM

## 2013-09-29 NOTE — Telephone Encounter (Signed)
appts made and printed...td 

## 2013-09-29 NOTE — Patient Instructions (Signed)

## 2013-12-30 ENCOUNTER — Encounter (INDEPENDENT_AMBULATORY_CARE_PROVIDER_SITE_OTHER): Payer: BC Managed Care – PPO | Admitting: General Surgery

## 2014-01-21 ENCOUNTER — Ambulatory Visit (INDEPENDENT_AMBULATORY_CARE_PROVIDER_SITE_OTHER): Payer: BC Managed Care – PPO | Admitting: General Surgery

## 2014-01-21 ENCOUNTER — Encounter (INDEPENDENT_AMBULATORY_CARE_PROVIDER_SITE_OTHER): Payer: Self-pay | Admitting: General Surgery

## 2014-01-21 ENCOUNTER — Other Ambulatory Visit (INDEPENDENT_AMBULATORY_CARE_PROVIDER_SITE_OTHER): Payer: Self-pay | Admitting: General Surgery

## 2014-01-21 VITALS — BP 128/76 | HR 73 | Temp 97.8°F | Ht 61.0 in | Wt 160.0 lb

## 2014-01-21 DIAGNOSIS — D0590 Unspecified type of carcinoma in situ of unspecified breast: Secondary | ICD-10-CM

## 2014-01-21 DIAGNOSIS — D0510 Intraductal carcinoma in situ of unspecified breast: Secondary | ICD-10-CM

## 2014-01-21 NOTE — Progress Notes (Signed)
Subjective:     Patient ID: Cynthia Ferguson, female   DOB: [birth year], 58 y.o.   MRN: [id number]  HPI This is a 58 year old female underwent a left breast lumpectomy for stage 0 breast cancer. Her nipple was somewhat inverted prior to that surgery. She completed radiation therapy in May of 2014. She is now on tamoxifen and she has no complaints with that. She does have some more inversion of her left nipple that is causing her some issues. She doesn't really have any evidence of any nipple discharge associated with this. She comes in today for a followup.  Review of Systems     Objective:   Physical Exam  Vitals reviewed. Constitutional: She appears well-developed and well-nourished.  Pulmonary/Chest: Right breast exhibits inverted nipple (longstanding minor). Right breast exhibits no mass, no nipple discharge, no skin change and no tenderness. Left breast exhibits inverted nipple (this is more pronounced than prior to surgery). Left breast exhibits no mass, no nipple discharge, no skin change and no tenderness.  Lymphadenopathy:    She has no cervical adenopathy.    She has no axillary adenopathy.       Right: No supraclavicular adenopathy present.       Left: No supraclavicular adenopathy present.       Assessment:    Inverted left nipple History stage 0 left breast cancer   Plan:     She has no clinical evidence of recurrence. She does however have worsening of her pre-existing inverted left nipple. I think it would be reasonable to do a mammogram on this side now as well as a subareolar ultrasound just to make sure that this is the case. If this is fine to follow up with her in one year.

## 2014-01-29 ENCOUNTER — Ambulatory Visit
Admission: RE | Admit: 2014-01-29 | Discharge: 2014-01-29 | Disposition: A | Discharge status: Home / Self Care | Payer: BC Managed Care – PPO | Source: Ambulatory Visit | Attending: General Surgery | Admitting: General Surgery

## 2014-01-29 ENCOUNTER — Other Ambulatory Visit (INDEPENDENT_AMBULATORY_CARE_PROVIDER_SITE_OTHER): Payer: Self-pay | Admitting: General Surgery

## 2014-01-29 DIAGNOSIS — D0510 Intraductal carcinoma in situ of unspecified breast: Secondary | ICD-10-CM

## 2014-01-29 DIAGNOSIS — D493 Neoplasm of unspecified behavior of breast: Secondary | ICD-10-CM

## 2014-02-02 ENCOUNTER — Other Ambulatory Visit (HOSPITAL_BASED_OUTPATIENT_CLINIC_OR_DEPARTMENT_OTHER): Payer: BC Managed Care – PPO

## 2014-02-02 ENCOUNTER — Encounter: Payer: Self-pay | Admitting: Oncology

## 2014-02-02 ENCOUNTER — Ambulatory Visit (HOSPITAL_BASED_OUTPATIENT_CLINIC_OR_DEPARTMENT_OTHER): Payer: BC Managed Care – PPO | Admitting: Oncology

## 2014-02-02 VITALS — BP 115/75 | HR 76 | Temp 98.3°F | Resp 18 | Ht 61.0 in | Wt 160.9 lb

## 2014-02-02 DIAGNOSIS — D0590 Unspecified type of carcinoma in situ of unspecified breast: Secondary | ICD-10-CM

## 2014-02-02 DIAGNOSIS — D0510 Intraductal carcinoma in situ of unspecified breast: Secondary | ICD-10-CM

## 2014-02-02 DIAGNOSIS — D0512 Intraductal carcinoma in situ of left breast: Secondary | ICD-10-CM

## 2014-02-02 LAB — COMPREHENSIVE METABOLIC PANEL (CC13)
ALBUMIN: 3.8 g/dL (ref 3.5–5.0)
ALK PHOS: 62 U/L (ref 40–150)
ALT: 33 U/L (ref 0–55)
AST: 21 U/L (ref 5–34)
Anion Gap: 9 mEq/L (ref 3–11)
BILIRUBIN TOTAL: 0.44 mg/dL (ref 0.20–1.20)
BUN: 11.5 mg/dL (ref 7.0–26.0)
CO2: 25 mEq/L (ref 22–29)
Calcium: 9.2 mg/dL (ref 8.4–10.4)
Chloride: 108 mEq/L (ref 98–109)
Creatinine: 0.8 mg/dL (ref 0.6–1.1)
GLUCOSE: 116 mg/dL (ref 70–140)
Potassium: 4.9 mEq/L (ref 3.5–5.1)
SODIUM: 142 meq/L (ref 136–145)
TOTAL PROTEIN: 6.7 g/dL (ref 6.4–8.3)

## 2014-02-02 LAB — CBC WITH DIFFERENTIAL/PLATELET
BASO%: 0.5 % (ref 0.0–2.0)
Basophils Absolute: 0 10*3/uL (ref 0.0–0.1)
EOS%: 3.5 % (ref 0.0–7.0)
Eosinophils Absolute: 0.2 10*3/uL (ref 0.0–0.5)
HCT: 37.2 % (ref 34.8–46.6)
HGB: 12.7 g/dL (ref 11.6–15.9)
LYMPH%: 18.7 % (ref 14.0–49.7)
MCH: 32.6 pg (ref 25.1–34.0)
MCHC: 34.1 g/dL (ref 31.5–36.0)
MCV: 95.4 fL (ref 79.5–101.0)
MONO#: 0.5 10*3/uL (ref 0.1–0.9)
MONO%: 8.3 % (ref 0.0–14.0)
NEUT#: 3.8 10*3/uL (ref 1.5–6.5)
NEUT%: 69 % (ref 38.4–76.8)
PLATELETS: 194 10*3/uL (ref 145–400)
RBC: 3.89 10*6/uL (ref 3.70–5.45)
RDW: 12.4 % (ref 11.2–14.5)
WBC: 5.5 10*3/uL (ref 3.9–10.3)
lymph#: 1 10*3/uL (ref 0.9–3.3)

## 2014-02-02 MED ORDER — TAMOXIFEN CITRATE 20 MG PO TABS: 20.0000 mg | ORAL_TABLET | Freq: Every day | ORAL | Status: DC

## 2014-02-02 NOTE — Patient Instructions (Addendum)
Continue tamoxifen 20 mg daily  We will see you back in 1 year  We discussed symptomatic management of your cold.  Breast Cancer Survivor Follow-Up Breast cancer begins when cells in the breast divide too rapidly. The extra cells form a lump (tumor). When the cancer is treated, the goal is to get rid of all cancer cells. However, sometimes a few cells survive. These cancer cells can then grow. They become recurrent cancer. This means the cancer comes back after treatment.  Most cases of recurrent breast cancer develop 3 to 5 years after treatment. However, sometimes it comes back just a few months after treatment. Other times, it does not come back until years later. If the cancer comes back in the same area as the first breast cancer, it is called a local recurrence. If the cancer comes back somewhere else in the body, it is called regional recurrence if the site is fairly near the breast or distant recurrence if it is far from the breast. Your caregiver may also use the term metastasize to indicate a cancer that has gone to another part of your body. Treatment is still possible after either kind of recurrence. The cancer can still be controlled.  CAUSES OF RECURRENT CANCER No one knows exactly why breast cancer starts in the first place. Why the cancer comes back after treatment is also not clear. It is known that certain conditions, called risk factors, can make this more likely. They include:  Developing breast cancer for the first time before age 36.  Having breast cancer that involves the lymph nodes. These are small, round pieces of tissue found all over the body. Their job is to help fight infections.  Having a large tumor. Cancer is more apt to come back if the first tumor was bigger than 2 inches (5 cm).  Having certain types of breast cancer, such as:  Inflammatory breast cancer. This rare type grows rapidly and causes the breast to become red and swollen.  A high-grade tumor. The  grade of a tumor indicates how fast it will grow and spread. High-grade tumors grow more quickly than other types.  HER2 cancer. This refers to the tumor's genetic makeup. Tumors that have this type of gene are more likely to come back after treatment.  Having close tumor margins. This refers to the space between the tumor and normal, noncancerous cells. If the space is small, the tumor has a greater chance of coming back.  Having treatment involving a surgery to remove the tumor but not the entire breast (lumpectomy) and no radiation therapy. CARE AFTER BREAST CANCER Home Monitoring Women who have had breast cancer should continue to examine their breasts every month. The goal is to catch the cancer quickly if it comes back. Many women find it helpful to do so on the same day each month and to mark the calendar as a reminder. Let your caregiver know immediately if you have any signs of recurrent breast cancer. Symptoms will vary, depending on where the cancer recurs. The original type of treatment can also make a difference. Symptoms of local recurrence after a lumpectomy or a recurrence in the opposite breast may include:  A new lump or thickening in the breast.  A change in the way the skin looks on the breast (such as a rash, dimpling, or wrinkling).  Redness or swelling of the breast.  Changes in the nipple (such as being red, puckered, swollen, or leaking fluid). Symptoms of a recurrence after  a breast removal surgery (mastectomy) may include:  A lump or thickening under the skin.  A thickening around the mastectomy scar. Symptoms of regional recurrence in the lymph nodes near the breast may include:  A lump under the arm or above the collarbone.  Swelling of the arm.  Pain in the arm, shoulder, or chest.  Numbness in the hand or arm. Symptoms of distant recurrence may include:  A cough that does not go away.  Trouble breathing or shortness of breath.  Pain in the bones  or the chest. This is pain that lasts or does not respond to rest and medicine.  Headaches.  Sudden vision problems.  Dizziness.  Nausea or vomiting.  Losing weight without trying to.  Persistent abdominal pain.  Changes in bowel movements or blood in the stool.  Yellowing of the skin or eyes (jaundice).  Blood in the urine or bloody vaginal discharge. Clinical Monitoring  It is helpful to keep a schedule of appointments for needed tests and exams. This includes physical exams, breast exams, exams of the lymph nodes, and general exams.  For the first 3 years after being treated for breast cancer, see your caregiver every 3 to 6 months.  For years 4 and 5 after breast cancer, see your caregiver every 6 to 12 months.  After 5 years, see your caregiver at least once a year.  Regular breast X-rays (mammograms) should continue even if you had a mastectomy.  A mammogram should be done 1 year after the mammogram that first detected breast cancer.  A mammogram should be done every 6 to 12 months after that. Follow your caregiver's advice.  A pelvic exam done by your caregiver checks whether female organs are the normal size and shape. The exam is usually done every year. Ask your caregiver if that schedule is right for you.  Women taking tamoxifen should report any vaginal bleeding immediately to their caregiver. Tamoxifen is often given to women with a certain type of breast cancer. It has been shown to help prevent recurrence.  You will need to decide who your primary caregiver will be.  Most people continue to see their cancer specialist (oncologist) every 3 to 6 months for the first year after cancer treatment.  At some point, you may want to go back to seeing your family caregiver. You would no longer see your oncologist for regular checkups. Many women do this about 1 year after their first diagnosis of breast cancer.  You will still need to be seen every so often by your  oncologist. Ask how often that should be. Coordinate this with your family or primary caregiver.  Think about having genetic counseling. This would provide information on traits that can be passed or inherited from one generation to the next. In some cases, breast cancer runs in families. Tell your caregiver if you:  Are of Ashkenazi Jewish heritage.  Have any family member who has had ovarian cancer.  Have a mother, sister, or daughter who had breast cancer before age 61.  Have 2 or more close relatives who have had breast cancer. This means a mother, sister, daughter, aunt, or grandmother.  Had breast cancer in both breasts.  Have a female relative who has had breast cancer.  Some tests are not recommended for routine screening. Someone recovering from breast cancer does not need to have these tests if there are no problems. The tests have risks, such as radiation exposure, and can be costly. The risks of  these tests are thought to be greater than the benefits:  Blood tests.  Chest X-rays.  Bone scans.  Liver ultrasound.  Computed tomography (CT scan).  Positron emission tomography (PET scan).  Magnetic resonance imaging (MRI scan). DIAGNOSIS OF RECURRENT CANCER Recurrent breast cancer may be suspected for various reasons. A mammogram may not look normal. You might feel a lump or have other symptoms. Your caregiver may find something unusual during an exam. To be sure, your caregiver will probably order some tests. The tests are needed because there are symptoms or hints of a problem. They could include:  Blood tests, including a test to check how well the liver is working. The liver is a common site for a distant cancer recurrence.  Imaging tests that create pictures of the inside of the body. These tests include:  Chest X-rays to show if the cancer has come back in the lungs.  CT scans to create detailed pictures of various areas of the body and help find a distant  recurrence.  MRI scans to find anything unusual in the breast, chest, or lymph nodes.  Breast ultrasound tests to examine the breasts.  Bone scans to create a picture of your whole skeleton and find cancer in bony areas.  PET scans to create an image of the whole body. PET scans can be used together with CT scans to show more detail.  Biopsy. A small sample of tissue is taken and checked under a microscope. If cancer cells are found, they may be tested to see if they contain the HER2 gene or the hormones estrogen and progesterone. This will help your caregiver decide how to treat the recurrent cancer. TREATMENT  How recurrent breast cancer is treated depends on where the new cancer is found. The type of treatment that was used for the first breast cancer makes a difference, too. A combination of treatments may be used. Options include:  Surgery.  If the cancer comes back in the breast that was not treated before, you may need a lumpectomy or mastectomy.  If the cancer comes back in the breast that was treated before, you may need a mastectomy.  The lymph nodes under the arm may need to be removed.  Radiation therapy.  For a local recurrence, radiation may be used if it was not used during the first treatment.  For a distance recurrence, radiation is sometimes used.  Chemotherapy.  This may be used before surgery to treat recurrent breast cancer.  This may be used to treat recurrent cancer that cannot be treated with surgery.  This may be used to treat a distant recurrence.  Hormone therapy.  Women with the HER2 gene may be given hormone therapy to attack this gene. Document Released: 06/21/2011 Document Revised: 01/15/2012 Document Reviewed: 06/21/2011 [facility] Patient Information 2014 [place], [place].

## 2014-02-02 NOTE — Progress Notes (Signed)
[facility] OFFICE PROGRESS NOTE [facility]  [address] [place] [address]  Dr. Uvaldo Rising  Dr. Rolm Bookbinder    DIAGNOSIS: 58 year old female with new diagnosis of ductal carcinoma in situ of the left breast.  STAGE:  Breast neoplasm, Tis (DCIS)  Primary site: Breast left breast  Staging method: AJCC 7th Edition  Summary:  SUMMARY OF ONCOLOGIC HISTORY: #1 patient had a screening mammogram performed and was found to have a 5 mm area of calcifications in the upper central portion of the left breast. She had a biopsy that showed a grade 2 ER-positive PR-positive DCIS. MRI showed only post biopsy changes.  #2 07/11/2013 patient had a lumpectomy which showed 0.6 cm ductal carcinoma in situ with calcifications, high-grade, margins negative. Tumor was ER +67% PR +4% lymph nodes were not examined.  #2 patient completed radiation therapy adjuvantly on May 2014.  #4 patient has been on tamoxifen 20 mg daily since June 2014. Tolerating it well without any problems  Current therapy: tamoxifen 20 mg daily  INTERVAL HISTORY: Cynthia Ferguson 58 y.o. female returns for followup visit today. Her last visit with me was in November 2014. Overall she's doing well. At that time she did get started on tamoxifen 20 mg daily. She's been on it since and has tolerated it well except for some hot flashes. She is denying any nausea vomiting fevers chills night sweats no myalgias and arthralgias. Remainder of the 10 point review of systems is negative.  I have reviewed the past medical history, past surgical history, social history and family history with the patient and they are unchanged from previous note.  ALLERGIES:  is allergic to trimethoprim.  MEDICATIONS:  Current Outpatient Prescriptions  Medication Sig Dispense Refill  . acetaminophen (TYLENOL) 325 MG tablet Take 650 mg by mouth every 6 (six) hours as needed for pain.      Marland Kitchen ALPRAZolam  (XANAX) 0.5 MG tablet Take 0.5 mg by mouth 2 (two) times daily. As needed for sleep and anxiety      . aspirin-acetaminophen-caffeine (EXCEDRIN MIGRAINE) 250-250-65 MG per tablet Take 2 tablets by mouth as needed.      . Calcium Carbonate-Vitamin D (CALCIUM 600-D) 600-400 MG-UNIT per tablet Take 1 tablet by mouth.      . calcium-vitamin D (OSCAL) 250-125 MG-UNIT per tablet Take 2 tablets by mouth 2 (two) times daily.       . Cranberry-Vitamin C-Probiotic (AZO CRANBERRY) 250-30 MG TABS Take 2 tablets by mouth.       . diclofenac (VOLTAREN) 50 MG EC tablet Take 50 mg by mouth daily.      Marland Kitchen losartan (COZAAR) 100 MG tablet Take 100 mg by mouth daily.      . multivitamin-lutein (OCUVITE-LUTEIN) CAPS capsule Take 1 capsule by mouth daily.      . nitrofurantoin (MACRODANTIN) 100 MG capsule Take 100 mg by mouth at bedtime.       Marland Kitchen omeprazole (PRILOSEC) 20 MG capsule Take 20 mg by mouth daily.      . polyethylene glycol (MIRALAX / GLYCOLAX) packet Take 17 g by mouth as needed.      . simvastatin (ZOCOR) 10 MG tablet Take 10 mg by mouth at bedtime.      . tamoxifen (NOLVADEX) 20 MG tablet Take 1 tablet (20 mg total) by mouth daily.  90 tablet  12  . Biotin 1 MG CAPS Take by mouth.      Marland Kitchen  docusate sodium (COLACE) 100 MG capsule Take 100 mg by mouth 2 (two) times daily.       No current facility-administered medications for this visit.    REVIEW OF SYSTEMS:   Constitutional: Denies fevers, chills or abnormal weight loss Eyes: Denies blurriness of vision Ears, nose, mouth, throat, and face: Denies mucositis or sore throat Respiratory: Denies cough, dyspnea or wheezes Cardiovascular: Denies palpitation, chest discomfort or lower extremity swelling Gastrointestinal:  Denies nausea, heartburn or change in bowel habits Skin: Denies abnormal skin rashes Lymphatics: Denies new lymphadenopathy or easy bruising Neurological:Denies numbness, tingling or new weaknesses Behavioral/Psych: Mood is stable, no new  changes  All other systems were reviewed with the patient and are negative.  PHYSICAL EXAMINATION: ECOG PERFORMANCE STATUS: 0 - Asymptomatic  Filed Vitals:   02/02/14 0959  BP: 115/75  Pulse: 76  Temp: 98.3 F (36.8 C)  Resp: 18   Filed Weights   02/02/14 0959  Weight: 160 lb 14.4 oz (72.984 kg)    GENERAL:alert, no distress and comfortable SKIN: skin color, texture, turgor are normal, no rashes or significant lesions EYES: normal, Conjunctiva are pink and non-injected, sclera clear OROPHARYNX:no exudate, no erythema and lips, buccal mucosa, and tongue normal  NECK: supple, thyroid normal size, non-tender, without nodularity LYMPH:  no palpable lymphadenopathy in the cervical, axillary or inguinal LUNGS: clear to auscultation and percussion with normal breathing effort HEART: regular rate & rhythm and no murmurs and no lower extremity edema ABDOMEN:abdomen soft, non-tender and normal bowel sounds Musculoskeletal:no cyanosis of digits and no clubbing  NEURO: alert & oriented x 3 with fluent speech, no focal motor/sensory deficits Bilateral breast exam: No masses nipple discharge. She has a well-healed lumpectomy scar in the left breast.  LABORATORY DATA:  I have reviewed the data as listed    Component Value Date/Time   NA 142 02/02/2014 0950   NA 142 07/08/2013 0915   K 4.9 02/02/2014 0950   K 4.1 07/08/2013 0915   CL 105 07/08/2013 0915   CO2 25 02/02/2014 0950   CO2 29 07/08/2013 0915   GLUCOSE 116 02/02/2014 0950   GLUCOSE 90 07/08/2013 0915   BUN 11.5 02/02/2014 0950   BUN 14 07/08/2013 0915   CREATININE 0.8 02/02/2014 0950   CREATININE 0.73 07/08/2013 0915   CALCIUM 9.2 02/02/2014 0950   CALCIUM 9.7 07/08/2013 0915   PROT 6.7 02/02/2014 0950   ALBUMIN 3.8 02/02/2014 0950   AST 21 02/02/2014 0950   ALT 33 02/02/2014 0950   ALKPHOS 62 02/02/2014 0950   BILITOT 0.44 02/02/2014 0950   GFRNONAA >90 07/08/2013 0915   GFRAA >90 07/08/2013 0915    No results found for this basename: SPEP,  UPEP,  kappa and lambda light chains    Lab Results  Component Value Date   WBC 5.5 02/02/2014   NEUTROABS 3.8 02/02/2014   HGB 12.7 02/02/2014   HCT 37.2 02/02/2014   MCV 95.4 02/02/2014   PLT 194 02/02/2014      Chemistry      Component Value Date/Time   NA 142 02/02/2014 0950   NA 142 07/08/2013 0915   K 4.9 02/02/2014 0950   K 4.1 07/08/2013 0915   CL 105 07/08/2013 0915   CO2 25 02/02/2014 0950   CO2 29 07/08/2013 0915   BUN 11.5 02/02/2014 0950   BUN 14 07/08/2013 0915   CREATININE 0.8 02/02/2014 0950   CREATININE 0.73 07/08/2013 0915      Component Value Date/Time  CALCIUM 9.2 02/02/2014 0950   CALCIUM 9.7 07/08/2013 0915   ALKPHOS 62 02/02/2014 0950   AST 21 02/02/2014 0950   ALT 33 02/02/2014 0950   BILITOT 0.44 02/02/2014 0950       RADIOGRAPHIC STUDIES: I have personally reviewed the radiological images as listed and agreed with the findings in the report. No results found.    ASSESSMENT & PLAN:  58 year old female with  #1 stage 0 (Tis N0) ductal carcinoma in situ of the left breast. Patient is status post lumpectomy. She then went on to receive adjuvant radiation therapy. She has been on adjuvant tamoxifen with curative intent tolerating it well. She has no evidence of local recurrence.  #2 surveillance: Patient's mammograms are up to date.  #3 survivorship: Patient is recommended to continue exercise eating healthy and maintain a good BMI.  #4 followup: Patient will be seen back in one year from now with blood work  No orders of the defined types were placed in this encounter.   All questions were answered. The patient knows to call the clinic with any problems, questions or concerns. No barriers to learning was detected. I spent 20 minutes counseling the patient face to face. The total time spent in the appointment was 30 minutes and more than 50% was on counseling and review of test results     Marcy Panning, MD 02/02/2014 11:34 AM

## 2014-02-03 ENCOUNTER — Telehealth: Payer: Self-pay | Admitting: Oncology

## 2014-02-03 NOTE — Telephone Encounter (Signed)
s.w. pt and advisedon March 2016 appt....pt ok and aware °

## 2014-02-04 ENCOUNTER — Telehealth (INDEPENDENT_AMBULATORY_CARE_PROVIDER_SITE_OTHER): Payer: Self-pay

## 2014-02-04 NOTE — Telephone Encounter (Signed)
Message copied by Illene Regulus on Wed Feb 04, 2014  9:26 AM ------      Message from: Donne Hazel, MATTHEW      Created: Mon Feb 02, 2014  3:34 PM       Lars Mage      She is due to see Humphrey Rolls in one year.  I told her one year but let me have her come back in 6 months for exam. Would you call and let her know please      MW ------

## 2014-02-04 NOTE — Telephone Encounter (Signed)
Called pt to notify her that we would like to change her next visit with Dr Donne Hazel. We did advise her that we would see her next time in one year but after pt saw Dr Humphrey Rolls that was going to be her next appt with Dr Humphrey Rolls. We are asking the pt to see Dr Donne Hazel in 6 mo.'s instead of one year so that way we could stagger the appt's out thru the year with Dr Humphrey Rolls. The pt understands and will wait to get the reminder from [place] to make her appt.

## 2014-04-24 ENCOUNTER — Other Ambulatory Visit: Payer: Self-pay | Admitting: Gynecology

## 2014-04-24 DIAGNOSIS — Z853 Personal history of malignant neoplasm of breast: Secondary | ICD-10-CM

## 2014-05-11 ENCOUNTER — Ambulatory Visit
Admission: RE | Admit: 2014-05-11 | Discharge: 2014-05-11 | Disposition: A | Discharge status: Home / Self Care | Payer: BC Managed Care – PPO | Source: Ambulatory Visit | Attending: Gynecology | Admitting: Gynecology

## 2014-05-11 DIAGNOSIS — Z853 Personal history of malignant neoplasm of breast: Secondary | ICD-10-CM

## 2014-06-08 ENCOUNTER — Ambulatory Visit (INDEPENDENT_AMBULATORY_CARE_PROVIDER_SITE_OTHER): Payer: BC Managed Care – PPO | Admitting: Gynecology

## 2014-06-08 ENCOUNTER — Encounter: Payer: Self-pay | Admitting: Gynecology

## 2014-06-08 VITALS — BP 142/90 | Ht 61.0 in | Wt 175.0 lb

## 2014-06-08 DIAGNOSIS — N393 Stress incontinence (female) (male): Secondary | ICD-10-CM

## 2014-06-08 DIAGNOSIS — Z01419 Encounter for gynecological examination (general) (routine) without abnormal findings: Secondary | ICD-10-CM

## 2014-06-08 DIAGNOSIS — Z23 Encounter for immunization: Secondary | ICD-10-CM

## 2014-06-08 DIAGNOSIS — Z853 Personal history of malignant neoplasm of breast: Secondary | ICD-10-CM

## 2014-06-08 DIAGNOSIS — IMO0002 Reserved for concepts with insufficient information to code with codable children: Secondary | ICD-10-CM

## 2014-06-08 DIAGNOSIS — N8111 Cystocele, midline: Secondary | ICD-10-CM

## 2014-06-08 DIAGNOSIS — N816 Rectocele: Secondary | ICD-10-CM

## 2014-06-08 DIAGNOSIS — N952 Postmenopausal atrophic vaginitis: Secondary | ICD-10-CM

## 2014-06-08 NOTE — Progress Notes (Signed)
Cynthia Ferguson Dec 10, 1955 [id number]   History:    58 y.o.  for annual gyn exam who was last seen in the office July 2014. Since then she was diagnosed with ductal carcinoma in situ of the left breast with estrogen and progesterone receptors being positive and margins were negative and she had a left lumpectomy in September 2014 and completed radiation treatment and is currently on daily tamoxifen and being followed by Dr. Humphrey Rolls hematologist oncologist. Her last bone density study was over 10 years ago. Patient does have history of total abdominal hysterectomy with bilateral salpingo-oophorectomy. persistent stress urinary incontinence and the sensation of "feeling her intestines coming out". On further questioning the patient she stated that approximately 10 years ago she had had a total abdominal hysterectomy with bilateral salpingo-oophorectomy and some form a bladder suspension by another provider here in [place] (Dr. Ubaldo Glassing). Patient 40 years ago had seen Dr. Nicki Reaper McDermaid (urologist) because of patient's recurrent urinary tract infections. He had placed her on Macrobid prophylactically which she would take 1 tablet daily. She later followed up with him because of worsening stress urinary incontinence and underwent a urodynamic evaluation and she had been placed on Toviaz and later her dose was increased along with the addition of Myrbetriq but has continued with the symptoms. Patient longer takes these medications but only takes Macrobid one tablet daily prophylactically because of recurrent urinary tract infections. Patient had a normal colonoscopy 7 years ago.  No previous history of abnormal Pap smears   Patient has continued to complain of   Past medical history,surgical history, family history and social history were all reviewed and documented in the EPIC chart.  Gynecologic History No LMP recorded. Patient is postmenopausal. Contraception: status post hysterectomy Last Pap:  Several  years ago. Results were: normal Last mammogram: 2014. Results were: abnormal  Obstetric History OB History  Gravida Para Term Preterm AB SAB TAB Ectopic Multiple Living  2 2 2       2     # Outcome Date GA Lbr Len/2nd Weight Sex Delivery Anes PTL Lv  2 TRM     M SVD  N Y  1 TRM     M SVD  N Y       ROS: A ROS was performed and pertinent positives and negatives are included in the history.  GENERAL: No fevers or chills. HEENT: No change in vision, no earache, sore throat or sinus congestion. NECK: No pain or stiffness. CARDIOVASCULAR: No chest pain or pressure. No palpitations. PULMONARY: No shortness of breath, cough or wheeze. GASTROINTESTINAL: No abdominal pain, nausea, vomiting or diarrhea, melena or bright red blood per rectum. GENITOURINARY: No urinary frequency, urgency, hesitancy or dysuria. MUSCULOSKELETAL: No joint or muscle pain, no back pain, no recent trauma. DERMATOLOGIC: No rash, no itching, no lesions. ENDOCRINE: No polyuria, polydipsia, no heat or cold intolerance. No recent change in weight. HEMATOLOGICAL: No anemia or easy bruising or bleeding. NEUROLOGIC: No headache, seizures, numbness, tingling or weakness. PSYCHIATRIC: No depression, no loss of interest in normal activity or change in sleep pattern.     Exam: chaperone present  BP 142/90  Ht 5\' 1"  (1.549 m)  Wt 175 lb (79.379 kg)  BMI 33.08 kg/m2  Body mass index is 33.08 kg/(m^2).  General appearance : Well developed well nourished female. No acute distress HEENT: Neck supple, trachea midline, no carotid bruits, no thyroidmegaly Lungs: Clear to auscultation, no rhonchi or wheezes, or rib retractions  Heart: Regular rate and  rhythm, no murmurs or gallops Breast:Examined in sitting and supine position were symmetrical in appearance, no palpable masses or tenderness,  no skin retraction, no nipple inversion, no nipple discharge, no skin discoloration, no axillary or supraclavicular lymphadenopathy Abdomen: no  palpable masses or tenderness, no rebound or guarding Extremities: no edema or skin discoloration or tenderness  Pelvic:  Bartholin, Urethra, Skene Glands: Within normal limits             Vagina: No gross lesions or discharge, first degree cystocele, first degree rectocele.  Cervix: Absent  Uterus absent  Adnexa  Without masses or tenderness  Anus and perineum  normal   Rectovaginal  normal sphincter tone without palpated masses or tenderness             Hemoccult PCP provides     Assessment/Plan:  58 y.o. female for annual exam with history of ductal carcinoma in situ of the left breast status post left lumpectomy and radiation therapy currently on tamoxifen doing well. She continues to complain of stress urinary incontinence and will followup with the urologist who she has seen before for the possibility of consideration for Botox treatment for stress urinary incontinence. The patient received a Tdap vaccine today. Her shingles vaccines up-to-date. She no longer needs Pap smear. We discussed importance of calcium vitamin D and regular exercise for osteoporosis prevention. Next year we will plan on doing a bone density study.  Note: This dictation was prepared with  Dragon/digital dictation along withSmart phrase technology. Any transcriptional errors that result from this process are unintentional.   Terrance Mass MD, 11:59 AM 06/08/2014

## 2014-06-08 NOTE — Addendum Note (Signed)
Addended by: Thurnell Garbe A on: 06/08/2014 04:22 PM   Modules accepted: Orders

## 2014-06-08 NOTE — Patient Instructions (Signed)

## 2014-07-22 ENCOUNTER — Telehealth: Payer: Self-pay | Admitting: *Deleted

## 2014-07-22 NOTE — Telephone Encounter (Signed)
Pt called to ask if she received the flu shot back in at Aug. Appointment.

## 2014-08-11 ENCOUNTER — Ambulatory Visit (INDEPENDENT_AMBULATORY_CARE_PROVIDER_SITE_OTHER): Payer: BC Managed Care – PPO | Admitting: General Surgery

## 2014-08-26 ENCOUNTER — Telehealth: Payer: Self-pay | Admitting: Hematology and Oncology

## 2014-08-26 NOTE — Telephone Encounter (Signed)
Lvm advising appt chg from 3/31 (md pal) to 4/26. Also mailed appt calendar.

## 2014-09-07 ENCOUNTER — Encounter: Payer: Self-pay | Admitting: Gynecology

## 2014-09-22 ENCOUNTER — Telehealth: Payer: Self-pay

## 2014-09-22 NOTE — Telephone Encounter (Signed)
Office Notes dtd 09/18/14 rcvd from [facility] Surgery Dr Donne Hazel.  Copy to Dr. Lindi Adie.  Original to scan.

## 2015-02-04 ENCOUNTER — Other Ambulatory Visit: Payer: BC Managed Care – PPO

## 2015-02-04 ENCOUNTER — Ambulatory Visit: Payer: BC Managed Care – PPO | Admitting: Hematology and Oncology

## 2015-03-01 NOTE — Assessment & Plan Note (Signed)
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 Tumor was ER +67% PR +4% lymph nodes were not examined.lumpectomy 07/2013 followed by Adjuvant XRT foll by Adjuvant Tamoxifen 20 daily since Dec 2014  Tamoxifen Toxicities: 1. Occasional Hot flashes Denies arthralgias  Breast Cancer Surveillance: 1. Breast exam 03/02/15: Normal 2. Mammogram 05/21/14 No abnormalities. Postsurgical changes. Breast Density Category C. I recommended that Cynthia Ferguson get 3-D mammograms for surveillance. Discussed the differences between different breast density categories.

## 2015-03-02 ENCOUNTER — Telehealth: Payer: Self-pay | Admitting: Hematology and Oncology

## 2015-03-02 ENCOUNTER — Ambulatory Visit (HOSPITAL_BASED_OUTPATIENT_CLINIC_OR_DEPARTMENT_OTHER): Payer: BLUE CROSS/BLUE SHIELD | Admitting: Hematology and Oncology

## 2015-03-02 ENCOUNTER — Other Ambulatory Visit: Payer: Self-pay | Admitting: *Deleted

## 2015-03-02 ENCOUNTER — Other Ambulatory Visit (HOSPITAL_BASED_OUTPATIENT_CLINIC_OR_DEPARTMENT_OTHER): Payer: BLUE CROSS/BLUE SHIELD

## 2015-03-02 VITALS — BP 155/81 | HR 93 | Temp 98.3°F | Resp 18 | Ht 61.0 in | Wt 174.8 lb

## 2015-03-02 DIAGNOSIS — C50112 Malignant neoplasm of central portion of left female breast: Secondary | ICD-10-CM

## 2015-03-02 DIAGNOSIS — D0512 Intraductal carcinoma in situ of left breast: Secondary | ICD-10-CM

## 2015-03-02 DIAGNOSIS — Z170 Estrogen receptor positive status [ER+]: Secondary | ICD-10-CM

## 2015-03-02 LAB — COMPREHENSIVE METABOLIC PANEL (CC13)
ALBUMIN: 4 g/dL (ref 3.5–5.0)
ALT: 77 U/L — AB (ref 0–55)
ANION GAP: 13 meq/L — AB (ref 3–11)
AST: 70 U/L — ABNORMAL HIGH (ref 5–34)
Alkaline Phosphatase: 76 U/L (ref 40–150)
BILIRUBIN TOTAL: 0.76 mg/dL (ref 0.20–1.20)
BUN: 9.4 mg/dL (ref 7.0–26.0)
CHLORIDE: 110 meq/L — AB (ref 98–109)
CO2: 22 mEq/L (ref 22–29)
CREATININE: 0.8 mg/dL (ref 0.6–1.1)
Calcium: 9.5 mg/dL (ref 8.4–10.4)
EGFR: 81 mL/min/{1.73_m2} — ABNORMAL LOW (ref >90)
GLUCOSE: 111 mg/dL (ref 70–140)
POTASSIUM: 4.4 meq/L (ref 3.5–5.1)
SODIUM: 145 meq/L (ref 136–145)
Total Protein: 6.9 g/dL (ref 6.4–8.3)

## 2015-03-02 LAB — CBC WITH DIFFERENTIAL/PLATELET
BASO%: 0.2 % (ref 0.0–2.0)
BASOS ABS: 0 10*3/uL (ref 0.0–0.1)
EOS%: 2.3 % (ref 0.0–7.0)
Eosinophils Absolute: 0.1 10*3/uL (ref 0.0–0.5)
HCT: 37.7 % (ref 34.8–46.6)
HGB: 12.9 g/dL (ref 11.6–15.9)
LYMPH#: 1.7 10*3/uL (ref 0.9–3.3)
LYMPH%: 35.5 % (ref 14.0–49.7)
MCH: 31.8 pg (ref 25.1–34.0)
MCHC: 34.2 g/dL (ref 31.5–36.0)
MCV: 92.9 fL (ref 79.5–101.0)
MONO#: 0.4 10*3/uL (ref 0.1–0.9)
MONO%: 9.1 % (ref 0.0–14.0)
NEUT#: 2.5 10*3/uL (ref 1.5–6.5)
NEUT%: 52.9 % (ref 38.4–76.8)
PLATELETS: 223 10*3/uL (ref 145–400)
RBC: 4.06 10*6/uL (ref 3.70–5.45)
RDW: 12.2 % (ref 11.2–14.5)
WBC: 4.7 10*3/uL (ref 3.9–10.3)

## 2015-03-02 NOTE — Telephone Encounter (Signed)
Called and left a message with one year appointment

## 2015-03-02 NOTE — Progress Notes (Signed)
Patient Care Team: Provider Not In System as PCP - General  DIAGNOSIS: No matching staging information was found for the patient.  SUMMARY OF ONCOLOGIC HISTORY:   Cancer of central portion of left female breast   07/11/2013 Surgery Left Lumpectomy: 0.6 cm ductal carcinoma in situ with calcifications, high-grade, margins negative. ER +67% PR +4% lymph nodes were not examined.   08/19/2013 - 09/30/2013 Radiation Therapy Adjuvant XRT   10/30/2013 - 03/01/2014 Anti-estrogen oral therapy Tamoxifen 20 mg daily x 5 yrs    CHIEF COMPLIANT: Follow-up of left breast cancer on tamoxifen  INTERVAL HISTORY: Cynthia Ferguson is a 59-year-old lady with above-mentioned history of left-sided breast cancer. With lumpectomy followed by radiation and is now on hormonal therapy with tamoxifen. She stopped taking tamoxifen after a few months on it.. She denies any myalgias or hot flashes. Denies any vaginal bleeding. Denies any lumps or nodules in the breasts.  REVIEW OF SYSTEMS:   Constitutional: Denies fevers, chills or abnormal weight loss Eyes: Denies blurriness of vision Ears, nose, mouth, throat, and face: Denies mucositis or sore throat Respiratory: Denies cough, dyspnea or wheezes Cardiovascular: Denies palpitation, chest discomfort or lower extremity swelling Gastrointestinal:  Denies nausea, heartburn or change in bowel habits Skin: Denies abnormal skin rashes Lymphatics: Denies new lymphadenopathy or easy bruising Neurological:Denies numbness, tingling or new weaknesses Behavioral/Psych: Mood is stable, no new changes  Breast:  denies any pain or lumps or nodules in either breasts All other systems were reviewed with the patient and are negative.  I have reviewed the past medical history, past surgical history, social history and family history with the patient and they are unchanged from previous note.  ALLERGIES:  is allergic to trimethoprim.  MEDICATIONS:  Current Outpatient Prescriptions   Medication Sig Dispense Refill  . ALPRAZolam (XANAX) 0.5 MG tablet Take 0.5 mg by mouth 2 (two) times daily. As needed for sleep and anxiety    . aspirin-acetaminophen-caffeine (EXCEDRIN MIGRAINE) 250-250-65 MG per tablet Take 2 tablets by mouth as needed.    . calcium-vitamin D (OSCAL) 250-125 MG-UNIT per tablet Take 2 tablets by mouth 2 (two) times daily.     . diclofenac (VOLTAREN) 50 MG EC tablet Take 50 mg by mouth daily.    Marland Kitchen losartan (COZAAR) 100 MG tablet Take 100 mg by mouth daily.    . nitrofurantoin (MACRODANTIN) 100 MG capsule Take 100 mg by mouth at bedtime.     Marland Kitchen omeprazole (PRILOSEC) 20 MG capsule Take 20 mg by mouth daily.    . simvastatin (ZOCOR) 40 MG tablet Take 40 mg by mouth daily.    Marland Kitchen acetaminophen (TYLENOL) 325 MG tablet Take 650 mg by mouth every 6 (six) hours as needed for pain.    . Calcium Carbonate-Vitamin D (CALCIUM 600-D) 600-400 MG-UNIT per tablet Take 1 tablet by mouth.    . multivitamin-lutein (OCUVITE-LUTEIN) CAPS capsule Take 1 capsule by mouth daily.    . polyethylene glycol (MIRALAX / GLYCOLAX) packet Take 17 g by mouth as needed.    . tamoxifen (NOLVADEX) 20 MG tablet Take 1 tablet (20 mg total) by mouth daily. (Patient not taking: Reported on 03/02/2015) 90 tablet 12   No current facility-administered medications for this visit.    PHYSICAL EXAMINATION: ECOG PERFORMANCE STATUS: 0 - Asymptomatic  Filed Vitals:   03/02/15 1006  BP: 155/81  Pulse: 93  Temp: 98.3 F (36.8 C)  Resp: 18   Filed Weights   03/02/15 1006  Weight: 174 lb 12.8 oz (  79.289 kg)    GENERAL:alert, no distress and comfortable SKIN: skin color, texture, turgor are normal, no rashes or significant lesions EYES: normal, Conjunctiva are pink and non-injected, sclera clear OROPHARYNX:no exudate, no erythema and lips, buccal mucosa, and tongue normal  NECK: supple, thyroid normal size, non-tender, without nodularity LYMPH:  no palpable lymphadenopathy in the cervical, axillary  or inguinal LUNGS: clear to auscultation and percussion with normal breathing effort HEART: regular rate & rhythm and no murmurs and no lower extremity edema ABDOMEN:abdomen soft, non-tender and normal bowel sounds Musculoskeletal:no cyanosis of digits and no clubbing  NEURO: alert & oriented x 3 with fluent speech, no focal motor/sensory deficits BREAST: No palpable masses or nodules in either right or left breasts. No palpable axillary supraclavicular or infraclavicular adenopathy no breast tenderness or nipple discharge. (exam performed in the presence of a chaperone)  LABORATORY DATA:  I have reviewed the data as listed   Chemistry      Component Value Date/Time   NA 145 03/02/2015 0953   NA 142 07/08/2013 0915   K 4.4 03/02/2015 0953   K 4.1 07/08/2013 0915   CL 105 07/08/2013 0915   CO2 22 03/02/2015 0953   CO2 29 07/08/2013 0915   BUN 9.4 03/02/2015 0953   BUN 14 07/08/2013 0915   CREATININE 0.8 03/02/2015 0953   CREATININE 0.73 07/08/2013 0915      Component Value Date/Time   CALCIUM 9.5 03/02/2015 0953   CALCIUM 9.7 07/08/2013 0915   ALKPHOS 76 03/02/2015 0953   AST 70* 03/02/2015 0953   ALT 77* 03/02/2015 0953   BILITOT 0.76 03/02/2015 0953       Lab Results  Component Value Date   WBC 4.7 03/02/2015   HGB 12.9 03/02/2015   HCT 37.7 03/02/2015   MCV 92.9 03/02/2015   PLT 223 03/02/2015   NEUTROABS 2.5 03/02/2015     RADIOGRAPHIC STUDIES: I have personally reviewed the radiology reports and agreed with their findings. Mammogram 05/21/2014 normal  ASSESSMENT & PLAN:  Cancer of central portion of left female breast Left Breast DCIS: 0.6 cm ductal carcinoma in situ with calcifications, high-grade, margins negative. Tumor was ER +67% PR +4% lymph nodes were not examined.lumpectomy 07/2013 followed by Adjuvant XRT foll by Adjuvant Tamoxifen 20 daily since Dec 2014  Tamoxifen Toxicities: Patient stopped taking tamoxifen after taking it for a few months because  it made her mood very bad and she was mean to her coworkers. She does not want to go back on it. We had a long discussion with her DCIS is breast cancer or a precancerous lesion.  Breast Cancer Surveillance: 1. Breast exam 03/02/15: Normal: Patient has an area on her left back where she does not have sensation but there is no evidence of skin lesions or any other abnormalities or pain. I have asked her to watch and monitor this for now. If there is any evidence of papular lesions I would be concerned about shingles. 2. Mammogram 05/21/14 No abnormalities. Postsurgical changes. Breast Density Category C. I recommended that she get 3-D mammograms for surveillance. Discussed the differences between different breast density categories.     No orders of the defined types were placed in this encounter.   The patient has a good understanding of the overall plan. she agrees with it. She will call with any problems that may develop before her next visit here.   Rulon Eisenmenger, MD

## 2015-04-13 ENCOUNTER — Other Ambulatory Visit: Payer: Self-pay | Admitting: Gynecology

## 2015-04-13 DIAGNOSIS — Z853 Personal history of malignant neoplasm of breast: Secondary | ICD-10-CM

## 2015-05-17 ENCOUNTER — Ambulatory Visit
Admission: RE | Admit: 2015-05-17 | Discharge: 2015-05-17 | Disposition: A | Discharge status: Home / Self Care | Payer: BLUE CROSS/BLUE SHIELD | Source: Ambulatory Visit | Attending: Gynecology | Admitting: Gynecology

## 2015-05-17 DIAGNOSIS — Z853 Personal history of malignant neoplasm of breast: Secondary | ICD-10-CM

## 2015-06-15 ENCOUNTER — Encounter: Payer: Self-pay | Admitting: Gynecology

## 2015-06-15 ENCOUNTER — Ambulatory Visit (INDEPENDENT_AMBULATORY_CARE_PROVIDER_SITE_OTHER): Payer: BLUE CROSS/BLUE SHIELD | Admitting: Gynecology

## 2015-06-15 VITALS — BP 136/88 | Ht 60.5 in | Wt 180.0 lb

## 2015-06-15 DIAGNOSIS — Z780 Asymptomatic menopausal state: Secondary | ICD-10-CM

## 2015-06-15 DIAGNOSIS — Z01419 Encounter for gynecological examination (general) (routine) without abnormal findings: Secondary | ICD-10-CM | POA: Diagnosis not present

## 2015-06-15 MED ORDER — NITROFURANTOIN MACROCRYSTAL 100 MG PO CAPS: 100.0000 mg | ORAL_CAPSULE | Freq: Every day | ORAL | Status: DC

## 2015-06-15 MED ORDER — DICLOFENAC SODIUM 50 MG PO TBEC: 50.0000 mg | DELAYED_RELEASE_TABLET | Freq: Every day | ORAL | Status: DC

## 2015-06-15 NOTE — Patient Instructions (Addendum)
Bone Densitometry Bone densitometry is a special X-ray that measures your bone density and can be used to help predict your risk of bone fractures. This test is used to determine bone mineral content and density to diagnose osteoporosis. Osteoporosis is the loss of bone that may cause the bone to become weak. Osteoporosis commonly occurs in women entering menopause. However, it may be found in men and in people with other diseases. PREPARATION FOR TEST No preparation necessary. WHO SHOULD BE TESTED?  All women older than 66.  Postmenopausal women (50 to 46) with risk factors for osteoporosis.  People with a previous fracture caused by normal activities.  People with a small body frame (less than 127 poundsor a body mass index [BMI] of less than 21).  People who have a parent with a hip fracture or history of osteoporosis.  People who smoke.  People who have rheumatoid arthritis.  Anyone who engages in excessive alcohol use (more than 3 drinks most days).  Women who experience early menopause. WHEN SHOULD YOU BE RETESTED? Current guidelines suggest that you should wait at least 2 years before doing a bone density test again if your first test was normal.Recent studies indicated that women with normal bone density may be able to wait a few years before needing to repeat a bone density test. You should discuss this with your caregiver.  NORMAL FINDINGS   Normal: less than standard deviation below normal (greater than -1).  Osteopenia: 1 to 2.5 standard deviations below normal (-1 to -2.5).  Osteoporosis: greater than 2.5 standard deviations below normal (less than -2.5). Test results are reported as a "T score" and a "Z score."The T score is a number that compares your bone density with the bone density of healthy, young women.The Z score is a number that compares your bone density with the scores of women who are the same age, gender, and race.  Ranges for normal findings may vary  among different laboratories and hospitals. You should always check with your doctor after having lab work or other tests done to discuss the meaning of your test results and whether your values are considered within normal limits. MEANING OF TEST  Your caregiver will go over the test results with you and discuss the importance and meaning of your results, as well as treatment options and the need for additional tests if necessary. OBTAINING THE TEST RESULTS It is your responsibility to obtain your test results. Ask the lab or department performing the test when and how you will get your results. Document Released: 11/14/2004 Document Revised: 01/15/2012 Document Reviewed: 12/07/2010 [facility] Patient Information 2015 [place], [place]. This information is not intended to replace advice given to you by your health care provider. Make sure you discuss any questions you have with your health care provider. Colonoscopy A colonoscopy is an exam to look at the entire large intestine (colon). This exam can help find problems such as tumors, polyps, inflammation, and areas of bleeding. The exam takes about 1 hour.  LET [facility] CARE PROVIDER KNOW ABOUT:  Any allergies you have. All medicines you are taking, including vitamins, herbs, eye drops, creams, and over-the-counter medicines. Previous problems you or members of your family have had with the use of anesthetics. Any blood disorders you have. Previous surgeries you have had. Medical conditions you have. RISKS AND COMPLICATIONS  Generally, this is a safe procedure. However, as with any procedure, complications can occur. Possible complications include: Bleeding. Tearing or rupture of the colon wall.  Reaction to medicines given during the exam. Infection (rare). BEFORE THE PROCEDURE  Ask your health care provider about changing or stopping your regular medicines. You may be prescribed an oral bowel prep. This involves drinking a large amount of  medicated liquid, starting the day before your procedure. The liquid will cause you to have multiple loose stools until your stool is almost clear or light green. This cleans out your colon in preparation for the procedure. Do not eat or drink anything else once you have started the bowel prep, unless your health care provider tells you it is safe to do so. Arrange for someone to drive you home after the procedure. PROCEDURE  You will be given medicine to help you relax (sedative). You will lie on your side with your knees bent. A long, flexible tube with a light and camera on the end (colonoscope) will be inserted through the rectum and into the colon. The camera sends video back to a computer screen as it moves through the colon. The colonoscope also releases carbon dioxide gas to inflate the colon. This helps your health care provider see the area better. During the exam, your health care provider may take a small tissue sample (biopsy) to be examined under a microscope if any abnormalities are found. The exam is finished when the entire colon has been viewed. AFTER THE PROCEDURE  Do not drive for 24 hours after the exam. You may have a small amount of blood in your stool. You may pass moderate amounts of gas and have mild abdominal cramping or bloating. This is caused by the gas used to inflate your colon during the exam. Ask when your test results will be ready and how you will get your results. Make sure you get your test results. Document Released: 10/20/2000 Document Revised: 08/13/2013 Document Reviewed: 06/30/2013 [facility] Patient Information 2015 [place], [place]. This information is not intended to replace advice given to you by your health care provider. Make sure you discuss any questions you have with your health care provider.

## 2015-06-15 NOTE — Progress Notes (Signed)
Cynthia Ferguson 09/10/1956 [id number]   History:    59 y.o.  for annual gyn exam  With no complaints today although occasional she describes her stools or loose her at times. She does not describe any blood in her stool. Her colonoscopy was approximately 10 years ago. Patient has had a history of ductal carcinoma in situ of the left breast with estrogen and progesterone receptors being positive and margins were negative and she had a left lumpectomy in September 2014 and completed radiation treatment and is  No longer taking tamoxifen  Which had originally been prescribed and  Is being followed by Dr. Humphrey Rolls hematologist oncologist  and Dr. Donne Hazel and general surgeon. Her last bone density study was over 10 years ago. Patient does have history of total abdominal hysterectomy with bilateral salpingo-oophorectomy. persistent stress urinary incontinence and the sensation of "feeling her intestines coming out". On further questioning the patient she stated that approximately 10 years ago she had had a total abdominal hysterectomy with bilateral salpingo-oophorectomy and some form a bladder suspension by another provider here in [place] (Dr. Ubaldo Glassing). Patient 40 years ago had seen Dr. Nicki Reaper McDermaid (urologist) because of patient's recurrent urinary tract infections. He had placed her on Macrobid prophylactically which she would take 1 tablet daily. She later followed up with him because of worsening stress urinary incontinence and underwent a urodynamic evaluation and she had been placed on Toviaz and later her dose was increased along with the addition of Myrbetriq but  Is no longer taking these medications.  Patient  only takes Macrobid one tablet daily prophylactically because of recurrent urinary tract infections.   patient with no past history of abnormal Pap smears.  Past medical history,surgical history, family history and social history were all reviewed and documented in the EPIC  chart.  Gynecologic History No LMP recorded. Patient is postmenopausal. Contraception: status post hysterectomy Last Pap:  Many years ago. Results were: normal Last mammogram:  2016. Results were:  Three-dimensional mammogram normal  Obstetric History OB History  Gravida Para Term Preterm AB SAB TAB Ectopic Multiple Living  2 2 2       2     # Outcome Date GA Lbr Len/2nd Weight Sex Delivery Anes PTL Lv  2 Term     M Vag-Spont  N Y  1 Term     M Vag-Spont  N Y       ROS: A ROS was performed and pertinent positives and negatives are included in the history.  GENERAL: No fevers or chills. HEENT: No change in vision, no earache, sore throat or sinus congestion. NECK: No pain or stiffness. CARDIOVASCULAR: No chest pain or pressure. No palpitations. PULMONARY: No shortness of breath, cough or wheeze. GASTROINTESTINAL: No abdominal pain, nausea, vomiting or diarrhea, melena or bright red blood per rectum. GENITOURINARY: No urinary frequency, urgency, hesitancy or dysuria. MUSCULOSKELETAL: No joint or muscle pain, no back pain, no recent trauma. DERMATOLOGIC: No rash, no itching, no lesions. ENDOCRINE: No polyuria, polydipsia, no heat or cold intolerance. No recent change in weight. HEMATOLOGICAL: No anemia or easy bruising or bleeding. NEUROLOGIC: No headache, seizures, numbness, tingling or weakness. PSYCHIATRIC: No depression, no loss of interest in normal activity or change in sleep pattern.     Exam: chaperone present  BP 136/88 mmHg  Ht 5' 0.5" (1.537 m)  Wt 180 lb (81.647 kg)  BMI 34.56 kg/m2  Body mass index is 34.56 kg/(m^2).  General appearance : Well developed well nourished female.  No acute distress HEENT: Eyes: no retinal hemorrhage or exudates,  Neck supple, trachea midline, no carotid bruits, no thyroidmegaly Lungs: Clear to auscultation, no rhonchi or wheezes, or rib retractions  Heart: Regular rate and rhythm, no murmurs or gallops Breast:Examined in sitting and supine  position were symmetrical in appearance, no palpable masses or tenderness,  no skin retraction, no nipple inversion, no nipple discharge, no skin discoloration, no axillary or supraclavicular lymphadenopathy Abdomen: no palpable masses or tenderness, no rebound or guarding Extremities: no edema or skin discoloration or tenderness  Pelvic:  Bartholin, Urethra, Skene Glands: Within normal limits             Vagina: No gross lesions or discharge , atrophic changes  Cervix:  absent  Uterus   absent  Adnexa  Without masses or tenderness  Anus and perineum  normal   Rectovaginal  normal sphincter tone without palpated masses or tenderness             Hemoccult  Cards provided     Assessment/Plan:  59 y.o. female for annual exam  With past history of ductal carcinoma in situ of the left breast status post left breasts lumpectomy and radiation therapy many years ago. Patient will longer taking the tamoxifen. Patient been followed by medical oncologist in general surgeon once a year. Patient's vaccines are up-to-date. Pap smear not indicated according the new guidelines. Patient to schedule her bone density study as well as her overdue colonoscopy. She was provided with fecal Hemoccult cards to submit to the office for testing. Her PCP we'll be doing her blood work.   Terrance Mass MD, 4:34 PM 06/15/2015

## 2015-06-18 ENCOUNTER — Telehealth: Payer: Self-pay | Admitting: *Deleted

## 2015-06-18 DIAGNOSIS — Z01419 Encounter for gynecological examination (general) (routine) without abnormal findings: Secondary | ICD-10-CM

## 2015-06-18 NOTE — Telephone Encounter (Signed)
Orders placed.

## 2015-06-18 NOTE — Telephone Encounter (Signed)
-----   Message from Sinclair Grooms sent at 06/17/2015  4:44 PM EDT ----- Regarding: lab JF wanted patient to do labs and she stated she will have them drawn at her pcp but now she has change her mind. Patient is going to do labs when she comes to her bone density appointment on Aug 23. She read off the labs he wrote for her, can you please place order for  CBC FLP TSH CMET

## 2015-06-25 ENCOUNTER — Telehealth: Payer: Self-pay

## 2015-06-25 NOTE — Telephone Encounter (Signed)
Ref #[id number] called stating they had a question re: drug utilization issue.  Pharmacist called. On  06/15/15 you prescribed generic Voltaren EC #90 for patient to take one daily.  The next day they received Rx from Dr. Talbert Forest Corrington for generic Voltaren EC for #180 (90d supply) to take bid.  So they are currently processing both these Rx's and wanted to check with you and see if you want to cancel Rx?

## 2015-06-25 NOTE — Telephone Encounter (Signed)
Pharmacy notified to cancel the Rx Dr. Moshe Salisbury prescribed.

## 2015-06-25 NOTE — Telephone Encounter (Signed)
If it was prescribed by another provider recently she does not need my prescription if it is up to date.

## 2015-06-29 ENCOUNTER — Other Ambulatory Visit: Payer: Self-pay | Admitting: Gynecology

## 2015-06-29 ENCOUNTER — Other Ambulatory Visit: Payer: BLUE CROSS/BLUE SHIELD

## 2015-06-29 ENCOUNTER — Ambulatory Visit (INDEPENDENT_AMBULATORY_CARE_PROVIDER_SITE_OTHER): Payer: BLUE CROSS/BLUE SHIELD

## 2015-06-29 DIAGNOSIS — M8580 Other specified disorders of bone density and structure, unspecified site: Secondary | ICD-10-CM

## 2015-06-29 DIAGNOSIS — Z01419 Encounter for gynecological examination (general) (routine) without abnormal findings: Secondary | ICD-10-CM

## 2015-06-29 DIAGNOSIS — Z780 Asymptomatic menopausal state: Secondary | ICD-10-CM

## 2015-06-29 DIAGNOSIS — Z13820 Encounter for screening for osteoporosis: Secondary | ICD-10-CM

## 2015-06-29 LAB — COMPREHENSIVE METABOLIC PANEL
ALK PHOS: 78 U/L (ref 33–130)
ALT: 31 U/L — AB (ref 6–29)
AST: 26 U/L (ref 10–35)
Albumin: 4.4 g/dL (ref 3.6–5.1)
BUN: 10 mg/dL (ref 7–25)
CALCIUM: 9.6 mg/dL (ref 8.6–10.4)
CO2: 26 mmol/L (ref 20–31)
Chloride: 106 mmol/L (ref 98–110)
Creat: 0.7 mg/dL (ref 0.50–0.99)
Glucose, Bld: 110 mg/dL — ABNORMAL HIGH (ref 65–99)
Potassium: 4.7 mmol/L (ref 3.5–5.3)
Sodium: 144 mmol/L (ref 135–146)
TOTAL PROTEIN: 6.8 g/dL (ref 6.1–8.1)
Total Bilirubin: 0.9 mg/dL (ref 0.2–1.2)

## 2015-06-29 LAB — LIPID PANEL
CHOLESTEROL: 170 mg/dL (ref 125–200)
HDL: 53 mg/dL (ref >=46)
LDL CALC: 101 mg/dL (ref <130)
TRIGLYCERIDES: 78 mg/dL (ref <150)
Total CHOL/HDL Ratio: 3.2 Ratio (ref <=5.0)
VLDL: 16 mg/dL (ref <30)

## 2015-06-29 LAB — CBC WITH DIFFERENTIAL/PLATELET
BASOS PCT: 0 % (ref 0–1)
Basophils Absolute: 0 10*3/uL (ref 0.0–0.1)
EOS ABS: 0.1 10*3/uL (ref 0.0–0.7)
EOS PCT: 1 % (ref 0–5)
HCT: 37.2 % (ref 36.0–46.0)
Hemoglobin: 12.1 g/dL (ref 12.0–15.0)
Lymphocytes Relative: 18 % (ref 12–46)
Lymphs Abs: 1.3 10*3/uL (ref 0.7–4.0)
MCH: 30.9 pg (ref 26.0–34.0)
MCHC: 32.5 g/dL (ref 30.0–36.0)
MCV: 95.1 fL (ref 78.0–100.0)
MONO ABS: 0.5 10*3/uL (ref 0.1–1.0)
MPV: 10.2 fL (ref 8.6–12.4)
Monocytes Relative: 7 % (ref 3–12)
Neutro Abs: 5.3 10*3/uL (ref 1.7–7.7)
Neutrophils Relative %: 74 % (ref 43–77)
Platelets: 235 10*3/uL (ref 150–400)
RBC: 3.91 MIL/uL (ref 3.87–5.11)
RDW: 13.4 % (ref 11.5–15.5)
WBC: 7.1 10*3/uL (ref 4.0–10.5)

## 2015-06-29 LAB — TSH: TSH: 1.372 u[IU]/mL (ref 0.350–4.500)

## 2015-07-02 DIAGNOSIS — Z1211 Encounter for screening for malignant neoplasm of colon: Secondary | ICD-10-CM | POA: Diagnosis not present

## 2015-07-05 ENCOUNTER — Other Ambulatory Visit: Payer: BLUE CROSS/BLUE SHIELD | Admitting: Anesthesiology

## 2015-07-05 DIAGNOSIS — Z1211 Encounter for screening for malignant neoplasm of colon: Secondary | ICD-10-CM

## 2015-08-03 ENCOUNTER — Other Ambulatory Visit: Payer: Self-pay

## 2015-08-03 MED ORDER — NITROFURANTOIN MACROCRYSTAL 100 MG PO CAPS: 100.0000 mg | ORAL_CAPSULE | Freq: Every day | ORAL | Status: DC

## 2015-11-30 ENCOUNTER — Other Ambulatory Visit: Payer: Self-pay | Admitting: Gynecology

## 2016-03-03 ENCOUNTER — Other Ambulatory Visit: Payer: Self-pay

## 2016-03-03 ENCOUNTER — Ambulatory Visit: Payer: BLUE CROSS/BLUE SHIELD | Admitting: Hematology and Oncology

## 2016-03-03 NOTE — Assessment & Plan Note (Signed)
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 Tumor was ER +67% PR +4% lymph nodes were not examined.lumpectomy 07/2013 followed by Adjuvant XRT foll by Adjuvant Tamoxifen 20 daily from Dec 2014 to march 2015  Tamoxifen Toxicities: Patient stopped taking tamoxifen after taking it for a few months because it made her mood very bad and she was mean to her coworkers.  Breast Cancer Surveillance: 1. Breast exam 03/03/2016: No palpable lumps or nodules of concern 2. Mammogram 05/17/2015 No abnormalities. Postsurgical changes. Breast Density Category C. I recommended that she get 3-D mammograms for surveillance. Discussed the differences between different breast density categories.  Return to clinic in 1 year for follow-up with survivorship

## 2016-03-06 ENCOUNTER — Telehealth: Payer: Self-pay | Admitting: Hematology and Oncology

## 2016-03-06 NOTE — Telephone Encounter (Signed)
lvm for pt to call us back when she is ready to r/s appt

## 2016-03-15 ENCOUNTER — Telehealth: Payer: Self-pay | Admitting: Hematology and Oncology

## 2016-03-15 NOTE — Telephone Encounter (Signed)
s.w. pt and r/s missed appt....pt ok and aware of new d.t °

## 2016-03-21 ENCOUNTER — Encounter: Payer: Self-pay | Admitting: Hematology and Oncology

## 2016-03-21 ENCOUNTER — Ambulatory Visit (HOSPITAL_BASED_OUTPATIENT_CLINIC_OR_DEPARTMENT_OTHER): Payer: BLUE CROSS/BLUE SHIELD | Admitting: Hematology and Oncology

## 2016-03-21 VITALS — BP 131/77 | HR 84 | Temp 98.5°F | Resp 18 | Wt 176.7 lb

## 2016-03-21 DIAGNOSIS — Z170 Estrogen receptor positive status [ER+]: Secondary | ICD-10-CM

## 2016-03-21 DIAGNOSIS — M8580 Other specified disorders of bone density and structure, unspecified site: Secondary | ICD-10-CM | POA: Diagnosis not present

## 2016-03-21 DIAGNOSIS — D0512 Intraductal carcinoma in situ of left breast: Secondary | ICD-10-CM

## 2016-03-21 DIAGNOSIS — C50112 Malignant neoplasm of central portion of left female breast: Secondary | ICD-10-CM

## 2016-03-21 NOTE — Assessment & Plan Note (Signed)
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 Tumor was ER +67% PR +4% lymph nodes were not examined.lumpectomy 07/2013 followed by Adjuvant XRT foll by Adjuvant Tamoxifen 20 daily since Dec 2014  Tamoxifen Toxicities: Patient stopped taking tamoxifen after taking it for a few months because it made her mood very bad and she was mean to her coworkers. She does not want to go back on it. We had a long discussion with her DCIS is breast cancer or a precancerous lesion.  Breast Cancer Surveillance: 1. Breast exam 03/21/2016: Normal: 2. Mammogram 05/17/15 No abnormalities. Postsurgical changes. Breast Density Category C. I recommended that she get 3-D mammograms for surveillance. Discussed the differences between different breast density categories. 3. Bone density 06/29/2015 osteopenia T score -2  Return to clinic in 1 year for surveillance

## 2016-03-21 NOTE — Progress Notes (Signed)
Patient Care Team: Kip A Corrington, MD as PCP - General (Family Medicine)  SUMMARY OF ONCOLOGIC HISTORY:   Cancer of central portion of left female breast ([place])   07/11/2013 Surgery Left Lumpectomy: 0.6 cm ductal carcinoma in situ with calcifications, high-grade, margins negative. ER +67% PR +4% lymph nodes were not examined.   08/19/2013 - 09/30/2013 Radiation Therapy Adjuvant XRT   10/30/2013 - 03/01/2014 Anti-estrogen oral therapy Tamoxifen 20 mg daily x 5 yrs    CHIEF COMPLIANT: follow-up of DCIS  INTERVAL HISTORY: Cynthia Ferguson is a 60 year old with above-mentioned history of left breast DCIS 1 underwent lumpectomy radiation and could not tolerate tamoxifenI did not want to take any other antiestrogen therapy. She is doing quite well without any problems or concerns. She had a bone density showing osteopenia. She is back on calcium and vitamin D. Denies any lumps or nodules in the breasts. She does have intermittent breast tenderness at the site of her surgery.  REVIEW OF SYSTEMS:   Constitutional: Denies fevers, chills or abnormal weight loss Eyes: Denies blurriness of vision Ears, nose, mouth, throat, and face: Denies mucositis or sore throat Respiratory: Denies cough, dyspnea or wheezes Cardiovascular: Denies palpitation, chest discomfort Gastrointestinal:  Denies nausea, heartburn or change in bowel habits Skin: Denies abnormal skin rashes Lymphatics: Denies new lymphadenopathy or easy bruising Neurological:Denies numbness, tingling or new weaknesses Behavioral/Psych: Mood is stable, no new changes  Extremities: No lower extremity edema Breast: intermittent breast tendernessat the site of surgery All other systems were reviewed with the patient and are negative.  I have reviewed the past medical history, past surgical history, social history and family history with the patient and they are unchanged from previous note.  ALLERGIES:  is allergic to  trimethoprim.  MEDICATIONS:  Current Outpatient Prescriptions  Medication Sig Dispense Refill  . acetaminophen (TYLENOL) 325 MG tablet Take 650 mg by mouth every 6 (six) hours as needed for pain.    Marland Kitchen ALPRAZolam (XANAX) 0.5 MG tablet Take 0.5 mg by mouth 2 (two) times daily. As needed for sleep and anxiety    . aspirin-acetaminophen-caffeine (EXCEDRIN MIGRAINE) 250-250-65 MG per tablet Take 2 tablets by mouth as needed.    . Calcium Carbonate-Vitamin D (CALCIUM 600-D) 600-400 MG-UNIT per tablet Take 1 tablet by mouth.    . calcium-vitamin D (OSCAL) 250-125 MG-UNIT per tablet Take 2 tablets by mouth 2 (two) times daily.     . cetirizine (ZYRTEC) 10 MG tablet Take 10 mg by mouth daily.    Marland Kitchen losartan (COZAAR) 100 MG tablet Take 100 mg by mouth daily.    . multivitamin-lutein (OCUVITE-LUTEIN) CAPS capsule Take 1 capsule by mouth daily.    . nitrofurantoin (MACRODANTIN) 100 MG capsule Take 1 capsule (100 mg total) by mouth at bedtime. 90 capsule 4  . nitrofurantoin, macrocrystal-monohydrate, (MACROBID) 100 MG capsule TAKE ONE CAPSULE DAILY WITH FOOD OR MILK. 30 capsule 0  . omeprazole (PRILOSEC) 20 MG capsule Take 20 mg by mouth daily.    . polyethylene glycol (MIRALAX / GLYCOLAX) packet Take 17 g by mouth as needed.    . simvastatin (ZOCOR) 40 MG tablet Take 40 mg by mouth daily.    . tamoxifen (NOLVADEX) 20 MG tablet Take 1 tablet (20 mg total) by mouth daily. (Patient not taking: Reported on 03/02/2015) 90 tablet 12   No current facility-administered medications for this visit.    PHYSICAL EXAMINATION: ECOG PERFORMANCE STATUS: 1 - Symptomatic but completely ambulatory  Filed Vitals:   03/21/16  0851  BP: 131/77  Pulse: 84  Temp: 98.5 F (36.9 C)  Resp: 18   Filed Weights   03/21/16 0851  Weight: 176 lb 11.2 oz (80.151 kg)    GENERAL:alert, no distress and comfortable SKIN: skin color, texture, turgor are normal, no rashes or significant lesions EYES: normal, Conjunctiva are pink  and non-injected, sclera clear OROPHARYNX:no exudate, no erythema and lips, buccal mucosa, and tongue normal  NECK: supple, thyroid normal size, non-tender, without nodularity LYMPH:  no palpable lymphadenopathy in the cervical, axillary or inguinal LUNGS: clear to auscultation and percussion with normal breathing effort HEART: regular rate & rhythm and no murmurs and no lower extremity edema ABDOMEN:abdomen soft, non-tender and normal bowel sounds MUSCULOSKELETAL:no cyanosis of digits and no clubbing  NEURO: alert & oriented x 3 with fluent speech, no focal motor/sensory deficits EXTREMITIES: No lower extremity edema BREAST: No palpable masses or nodules in either right or left breasts. No palpable axillary supraclavicular or infraclavicular adenopathy no breast tenderness or nipple discharge. (exam performed in the presence of a chaperone)  LABORATORY DATA:  I have reviewed the data as listed   Chemistry      Component Value Date/Time   NA 144 06/29/2015 0953   NA 145 03/02/2015 0953   K 4.7 06/29/2015 0953   K 4.4 03/02/2015 0953   CL 106 06/29/2015 0953   CO2 26 06/29/2015 0953   CO2 22 03/02/2015 0953   BUN 10 06/29/2015 0953   BUN 9.4 03/02/2015 0953   CREATININE 0.70 06/29/2015 0953   CREATININE 0.8 03/02/2015 0953   CREATININE 0.73 07/08/2013 0915      Component Value Date/Time   CALCIUM 9.6 06/29/2015 0953   CALCIUM 9.5 03/02/2015 0953   ALKPHOS 78 06/29/2015 0953   ALKPHOS 76 03/02/2015 0953   AST 26 06/29/2015 0953   AST 70* 03/02/2015 0953   ALT 31* 06/29/2015 0953   ALT 77* 03/02/2015 0953   BILITOT 0.9 06/29/2015 0953   BILITOT 0.76 03/02/2015 0953       Lab Results  Component Value Date   WBC 7.1 06/29/2015   HGB 12.1 06/29/2015   HCT 37.2 06/29/2015   MCV 95.1 06/29/2015   PLT 235 06/29/2015   NEUTROABS 5.3 06/29/2015     ASSESSMENT & PLAN:  Cancer of central portion of left female breast Left Breast DCIS: 0.6 cm ductal carcinoma in situ with  calcifications, high-grade, margins negative. Tumor was ER +67% PR +4% lymph nodes were not examined.lumpectomy 07/2013 followed by Adjuvant XRT foll by Adjuvant Tamoxifen 20 daily since Dec 2014  Tamoxifen Toxicities: Patient stopped taking tamoxifen after taking it for a few months because it made her mood very bad and she was mean to her coworkers. She does not want to go back on it. We had a long discussion with her DCIS is breast cancer or a precancerous lesion.  Breast Cancer Surveillance: 1. Breast exam 03/21/2016: Normal: 2. Mammogram 05/17/15 No abnormalities. Postsurgical changes. Breast Density Category C. I recommended that she get 3-D mammograms for surveillance. Discussed the differences between different breast density categories. 3. Bone density 06/29/2015 osteopenia T score -2  We spent 15 minutes talking about the importance of physical activity and exercise in decreasing breast cancer recurrence as well as her general health and well-being.  I offered patient follow up in the survivorship clinic but the patient prefers to see her primary care physician and her gynecologist for her breast surveillance. We are happy to see her on  an as-needed basis.   No orders of the defined types were placed in this encounter.   The patient has a good understanding of the overall plan. she agrees with it. she will call with any problems that may develop before the next visit here.   Rulon Eisenmenger, MD 03/21/2016

## 2016-05-19 ENCOUNTER — Other Ambulatory Visit: Payer: Self-pay | Admitting: Gynecology

## 2016-05-19 DIAGNOSIS — Z853 Personal history of malignant neoplasm of breast: Secondary | ICD-10-CM

## 2016-06-05 ENCOUNTER — Ambulatory Visit
Admission: RE | Admit: 2016-06-05 | Discharge: 2016-06-05 | Disposition: A | Discharge status: Home / Self Care | Payer: BLUE CROSS/BLUE SHIELD | Source: Ambulatory Visit | Attending: Gynecology | Admitting: Gynecology

## 2016-06-05 ENCOUNTER — Encounter: Payer: Self-pay | Admitting: Gastroenterology

## 2016-06-05 DIAGNOSIS — Z853 Personal history of malignant neoplasm of breast: Secondary | ICD-10-CM

## 2016-06-26 ENCOUNTER — Encounter: Payer: Self-pay | Admitting: Gastroenterology

## 2016-11-09 ENCOUNTER — Encounter: Payer: Self-pay | Admitting: Gynecology

## 2016-11-09 ENCOUNTER — Ambulatory Visit (INDEPENDENT_AMBULATORY_CARE_PROVIDER_SITE_OTHER): Payer: BLUE CROSS/BLUE SHIELD | Admitting: Gynecology

## 2016-11-09 VITALS — BP 124/82

## 2016-11-09 DIAGNOSIS — Z1151 Encounter for screening for human papillomavirus (HPV): Secondary | ICD-10-CM

## 2016-11-09 DIAGNOSIS — N9089 Other specified noninflammatory disorders of vulva and perineum: Secondary | ICD-10-CM

## 2016-11-09 DIAGNOSIS — Z853 Personal history of malignant neoplasm of breast: Secondary | ICD-10-CM | POA: Diagnosis not present

## 2016-11-09 DIAGNOSIS — Z124 Encounter for screening for malignant neoplasm of cervix: Secondary | ICD-10-CM

## 2016-11-09 DIAGNOSIS — N905 Atrophy of vulva: Secondary | ICD-10-CM | POA: Diagnosis not present

## 2016-11-09 DIAGNOSIS — N950 Postmenopausal bleeding: Secondary | ICD-10-CM | POA: Diagnosis not present

## 2016-11-09 DIAGNOSIS — Z1272 Encounter for screening for malignant neoplasm of vagina: Secondary | ICD-10-CM | POA: Diagnosis not present

## 2016-11-09 MED ORDER — CLOBETASOL PROPIONATE 0.05 % EX CREA: TOPICAL_CREAM | CUTANEOUS | 2 refills | Status: DC

## 2016-11-09 NOTE — Patient Instructions (Signed)

## 2016-11-09 NOTE — Addendum Note (Signed)
Addended by: Thurnell Garbe A on: 11/09/2016 10:31 AM   Modules accepted: Orders

## 2016-11-09 NOTE — Progress Notes (Signed)
  Patient is a 61 year old that presented to the office today stating that for the past several weeks she has noted these blisters on her external genitalia and has become irritated and she's noted some blood when she has wiped or under her garments. She knows is from this area and not from inside her vagina.Patient has had a history of ductal carcinoma in situ of the left breast with estrogen and progesterone receptors being positive and margins were negative and she had a left lumpectomy in September 2014 and completed radiation treatment and is  No longer taking tamoxifen  Which had originally been prescribed and  Is being followed by Dr. Humphrey Rolls hematologist oncologist  and Dr. Donne Hazel and general surgeon. Patient has a history of total abdominal hysterectomy with bilateral salpingo-oophorectomy and she feels that they did a sling procedure in the past as well. Last time she was seen for annual exam was in August 2016  Exam: Physical Exam  Genitourinary:      Patient underwent a detail colposcopic examination of the external genitalia, perineum and perirectal region with the above areas noted. The speculum was introduced into the vagina atrophic changes were noted but no gross lesions on inspection were noted  Assessment/plan: Vulvar atrophy with blood blisters noted probably source of the spotting the patient noted that incidental finding and indurated pill area at the area the fourchette. The area was cleansed with Betadine solution and 1% lidocaine was infiltrated subdermally and a keypunch biopsy was obtained. For hemostasis overnighter was applied. Patient will be prescribed clobetasol 0.05% to apply externally daily at bedtime for one week then once weekly thereafter. She will return back to the office in 8 weeks for her annual exam and we'll reassess the area. Will notify with the results of the biopsy and manage 40. We did do a Pap smear today as well.

## 2016-11-09 NOTE — Addendum Note (Signed)
Addended by: Thurnell Garbe A on: 11/09/2016 09:50 AM   Modules accepted: Orders

## 2016-11-09 NOTE — Addendum Note (Signed)
Addended by: Thurnell Garbe A on: 11/09/2016 10:28 AM   Modules accepted: Orders

## 2016-11-10 ENCOUNTER — Telehealth: Payer: Self-pay

## 2016-11-10 LAB — PAP IG W/ RFLX HPV ASCU

## 2016-11-10 NOTE — Telephone Encounter (Signed)
Patient advised.

## 2016-11-10 NOTE — Telephone Encounter (Signed)
Tell her to hold off on the sitz baths for one week

## 2016-11-10 NOTE — Telephone Encounter (Signed)
Patient said she had biopsy yesterday and you had recommended she soak in the tub this morning for 15 minutes. However, she said it has never stopped bleeding. She said every time she goes to the bathroom it sets off the bleeding. She wondered if she should still soak in tub in light of bleeding persisting.

## 2017-01-08 ENCOUNTER — Encounter: Payer: BLUE CROSS/BLUE SHIELD | Admitting: Gynecology

## 2017-03-21 ENCOUNTER — Encounter: Payer: Self-pay | Admitting: Gynecology

## 2017-05-03 ENCOUNTER — Other Ambulatory Visit: Payer: Self-pay | Admitting: Gynecology

## 2017-05-03 DIAGNOSIS — Z86000 Personal history of in-situ neoplasm of breast: Secondary | ICD-10-CM

## 2017-06-06 ENCOUNTER — Other Ambulatory Visit: Payer: Self-pay | Admitting: Gynecology

## 2017-06-06 ENCOUNTER — Ambulatory Visit
Admission: RE | Admit: 2017-06-06 | Discharge: 2017-06-06 | Disposition: A | Discharge status: Home / Self Care | Payer: BLUE CROSS/BLUE SHIELD | Source: Ambulatory Visit | Attending: Gynecology | Admitting: Gynecology

## 2017-06-06 DIAGNOSIS — Z86000 Personal history of in-situ neoplasm of breast: Secondary | ICD-10-CM

## 2017-06-06 DIAGNOSIS — N6489 Other specified disorders of breast: Secondary | ICD-10-CM

## 2017-06-06 HISTORY — DX: Personal history of irradiation: Z92.3

## 2017-06-11 ENCOUNTER — Other Ambulatory Visit: Payer: Self-pay | Admitting: Women's Health

## 2017-06-11 ENCOUNTER — Ambulatory Visit
Admission: RE | Admit: 2017-06-11 | Discharge: 2017-06-11 | Disposition: A | Discharge status: Home / Self Care | Payer: BLUE CROSS/BLUE SHIELD | Source: Ambulatory Visit | Attending: Women's Health | Admitting: Women's Health

## 2017-06-11 ENCOUNTER — Ambulatory Visit
Admission: RE | Admit: 2017-06-11 | Discharge: 2017-06-11 | Disposition: A | Discharge status: Home / Self Care | Payer: BLUE CROSS/BLUE SHIELD | Source: Ambulatory Visit | Attending: Gynecology | Admitting: Gynecology

## 2017-06-11 ENCOUNTER — Other Ambulatory Visit: Payer: Self-pay | Admitting: Gynecology

## 2017-06-11 DIAGNOSIS — N6489 Other specified disorders of breast: Secondary | ICD-10-CM

## 2017-06-11 DIAGNOSIS — N6320 Unspecified lump in the left breast, unspecified quadrant: Secondary | ICD-10-CM

## 2017-06-11 DIAGNOSIS — R928 Other abnormal and inconclusive findings on diagnostic imaging of breast: Secondary | ICD-10-CM

## 2017-07-17 ENCOUNTER — Telehealth: Payer: Self-pay | Admitting: Obstetrics & Gynecology

## 2017-07-17 ENCOUNTER — Encounter: Payer: Self-pay | Admitting: Obstetrics & Gynecology

## 2017-07-17 ENCOUNTER — Ambulatory Visit (INDEPENDENT_AMBULATORY_CARE_PROVIDER_SITE_OTHER): Payer: BLUE CROSS/BLUE SHIELD | Admitting: Obstetrics & Gynecology

## 2017-07-17 VITALS — BP 126/72

## 2017-07-17 DIAGNOSIS — R8290 Unspecified abnormal findings in urine: Secondary | ICD-10-CM

## 2017-07-17 DIAGNOSIS — N61 Mastitis without abscess: Secondary | ICD-10-CM

## 2017-07-17 MED ORDER — DICLOXACILLIN SODIUM 500 MG PO CAPS: 500.0000 mg | ORAL_CAPSULE | Freq: Four times a day (QID) | ORAL | 0 refills | Status: DC

## 2017-07-17 NOTE — Progress Notes (Signed)
    Cynthia Ferguson Sep 28, 1956 [id number]        61 y.o.  G2P2002   RP:  Left breast swelling/redness/tenderness around the nipple area  HPI:  Had a left breast Bx 06/11/2017.  Patho:  Benign, Fibrocystic changes.  Bruise a lot after Lt Breast Bx and was tender.  As bruising improved, continued to feel tender and now has redness and worsening tenderness.  No lump felt.  No nipple discharge.  No fever.  Past medical history,surgical history, problem list, medications, allergies, family history and social history were all reviewed and documented in the EPIC chart.  Directed ROS with pertinent positives and negatives documented in the history of present illness/assessment and plan.  Exam:  Vitals:   07/17/17 1205  BP: 126/72   General appearance:  Normal  Breast exam:  Rt breast normal                         Lt breast:  Large area of erythema/cellulitis all around and including the nipple area.  Tender and warm to touch.  No nodule or mass felt, no evidence of abscess.    Assessment/Plan:  61 y.o. G2P2002   1. Acute mastitis of left breast Post Breast Bx.  Will treat with Dicloxacillin 500 mg PO QID x 10 days.  Prescription sent to pharmacy, usage discussed.  Ice today if painful, then warm soaking to stimulate healing.  F/u 1 week to reevaluate at time of Gyn/Annual exam.   2. Urine malodor Mild changes in U/A, will await U. Culture to see if UTI and need for treatment. - Urine Culture - Urinalysis, Routine w reflex microscopic  Counseling on above issues >50% x 25 minutes  Princess Bruins MD, 12:20 PM 07/17/2017

## 2017-07-17 NOTE — Telephone Encounter (Signed)
Apt 07/17/17 with Dr. Dellis Filbert. KW CMA

## 2017-07-17 NOTE — Telephone Encounter (Signed)
Pt called last night with complaint of redness on breast about 5 weeks after biopsy was done.  No heat in breast, no fever, non tender.  Described very extensive bruising after biopsy.  H/o left breast cancer.  Offered to see pt in [facility].  She did not want to go as she did not think there was infection present.  Just really worried.  Advised OV tomorrow if possible.

## 2017-07-17 NOTE — Patient Instructions (Signed)
1. Acute mastitis of left breast Post Breast Bx.  Will treat with Dicloxacillin 500 mg PO QID x 10 days.  Prescription sent to pharmacy, usage discussed.  Ice today if painful, then warm soaking to stimulate healing.  F/u 1 week to reevaluate at time of Gyn/Annual exam.   2. Urine malodor Mild changes in U/A, will await U. Culture to see if UTI and need for treatment. - Urine Culture - Urinalysis, Routine w reflex microscopic  Cynthia Ferguson, it was a pleasure to meet you today!  I will inform you of your results as soon as available.   Mastitis Mastitis is inflammation of the breast tissue. It occurs most often in women who are breastfeeding, but it can also affect other women, and even sometimes men. What are the causes? Mastitis is usually caused by a bacterial infection. Bacteria enter the breast tissue through cuts or openings in the skin. Typically, this occurs with breastfeeding because of cracked or irritated skin. Sometimes, it can occur even when there is no opening in the skin. It can be associated with plugged milk (lactiferous) ducts. Nipple piercing can also lead to mastitis. Also, some forms of breast cancer can cause mastitis. What are the signs or symptoms?  Swelling, redness, tenderness, and pain in an area of the breast.  Swelling of the glands under the arm on the same side.  Fever. If an infection is allowed to progress, a collection of pus (abscess) may develop. How is this diagnosed? Your health care provider can usually diagnose mastitis based on your symptoms and a physical exam. Tests may be done to help confirm the diagnosis. These may include:  Removal of pus from the breast by applying pressure to the area. This pus can be examined in the lab to determine which bacteria are present. If an abscess has developed, the fluid in the abscess can be removed with a needle. This can also be used to confirm the diagnosis and determine the bacteria present. In most cases, pus will  not be present.  Blood tests to determine if your body is fighting a bacterial infection.  Mammogram or ultrasound tests to rule out other problems or diseases.  How is this treated? Antibiotic medicine is used to treat a bacterial infection. Your health care provider will determine which bacteria are most likely causing the infection and will select an appropriate antibiotic. This is sometimes changed based on the results of tests performed to identify the bacteria, or if there is no response to the antibiotic selected. Antibiotics are usually given by mouth. You may also be given medicine for pain. Mastitis that occurs with breastfeeding will sometimes go away on its own, so your health care provider may choose to wait 24 hours after first seeing you to decide whether a prescription medicine is needed. Follow these instructions at home:  Only take over-the-counter or prescription medicines for pain, fever, or discomfort as directed by your health care provider.  If your health care provider prescribed an antibiotic, take the medicine as directed. Make sure you finish it even if you start to feel better.  Do not wear a tight or underwire bra. Wear a soft, supportive bra.  Increase your fluid intake, especially if you have a fever.  Women who are breastfeeding should follow these instructions: ? Continue to empty the breast. Your health care provider can tell you whether this milk is safe for your infant or needs to be thrown out. You may be told to stop nursing  until your health care provider thinks it is safe for your baby. Use a breast pump if you are advised to stop nursing. ? Keep your nipples clean and dry. ? Empty the first breast completely before going to the other breast. If your baby is not emptying your breasts completely for some reason, use a breast pump to empty your breasts. ? If you go back to work, pump your breasts while at work to stay in time with your nursing  schedule. ? Avoid allowing your breasts to become overly filled with milk (engorged). Contact a health care provider if:  You have pus-like discharge from the breast.  Your symptoms do not improve with the treatment prescribed by your health care provider within 2 days. Get help right away if:  Your pain and swelling are getting worse.  You have pain that is not controlled with medicine.  You have a red line extending from the breast toward your armpit.  You have a fever or persistent symptoms for more than 2-3 days.  You have a fever and your symptoms suddenly get worse. This information is not intended to replace advice given to you by your health care provider. Make sure you discuss any questions you have with your health care provider. Document Released: 10/23/2005 Document Revised: 03/30/2016 Document Reviewed: 05/23/2013 Elsevier Interactive Patient Education  2017 [employer].

## 2017-07-18 ENCOUNTER — Other Ambulatory Visit: Payer: Self-pay | Admitting: Neurosurgery

## 2017-07-18 DIAGNOSIS — M4310 Spondylolisthesis, site unspecified: Secondary | ICD-10-CM

## 2017-07-19 ENCOUNTER — Telehealth: Payer: Self-pay | Admitting: *Deleted

## 2017-07-19 MED ORDER — AMOXICILLIN-POT CLAVULANATE 875-125 MG PO TABS: 1.0000 | ORAL_TABLET | Freq: Two times a day (BID) | ORAL | 0 refills | Status: DC

## 2017-07-19 NOTE — Telephone Encounter (Signed)
Thanks

## 2017-07-19 NOTE — Telephone Encounter (Signed)
Yes, change to Augmentin 875/125 BID x 7 days.  Is the Mastitis improving?

## 2017-07-19 NOTE — Telephone Encounter (Signed)
Pt said area doesn't appear to be as red. Rx sent.

## 2017-07-19 NOTE — Telephone Encounter (Signed)
Pt was seen on 07/17/17 prescribed dicloxacillin 500 mg qid x 10 days, states she is having a reaction to medication has a rash on arm/back. Pt asked if another medication can be prescribed? Please advise

## 2017-07-23 ENCOUNTER — Telehealth (HOSPITAL_COMMUNITY): Payer: Self-pay | Admitting: Family Medicine

## 2017-07-23 NOTE — Telephone Encounter (Signed)
07/23/17  Pt returned our call ... I called her back and had to leave another message

## 2017-07-26 ENCOUNTER — Ambulatory Visit (INDEPENDENT_AMBULATORY_CARE_PROVIDER_SITE_OTHER): Payer: BLUE CROSS/BLUE SHIELD | Admitting: Obstetrics & Gynecology

## 2017-07-26 ENCOUNTER — Encounter: Payer: Self-pay | Admitting: Obstetrics & Gynecology

## 2017-07-26 ENCOUNTER — Other Ambulatory Visit: Payer: Self-pay | Admitting: Anesthesiology

## 2017-07-26 ENCOUNTER — Other Ambulatory Visit: Payer: Self-pay | Admitting: *Deleted

## 2017-07-26 VITALS — BP 138/86

## 2017-07-26 DIAGNOSIS — N61 Mastitis without abscess: Secondary | ICD-10-CM | POA: Diagnosis not present

## 2017-07-26 DIAGNOSIS — R350 Frequency of micturition: Secondary | ICD-10-CM

## 2017-07-26 MED ORDER — AMOXICILLIN-POT CLAVULANATE 875-125 MG PO TABS: 1.0000 | ORAL_TABLET | Freq: Two times a day (BID) | ORAL | 0 refills | Status: AC

## 2017-07-26 NOTE — Addendum Note (Signed)
Addended by: Princess Bruins on: 07/26/2017 04:10 PM   Modules accepted: Orders

## 2017-07-26 NOTE — Progress Notes (Addendum)
    MARISKA DAFFIN [birth year] [id number]        61 y.o.  G2P2002   RP:  F/u Left Mastitis  HPI:  Much improved, but still mildly tender.  No redness anymore.  No drainage.  No lump felt.  No fever.  Was supposed to do Annual/Gyn exam today as well, but patient has severe neck pain and doesn't want to lay down for exam.  Per patient, a CS injection is planned.  Past medical history,surgical history, problem list, medications, allergies, family history and social history were all reviewed and documented in the EPIC chart.  Directed ROS with pertinent positives and negatives documented in the history of present illness/assessment and plan.  Exam:  Vitals:   07/26/17 0933  BP: 138/86   General appearance:  Normal  Breasts (exam done in sitting position because of severe neck pain):  Right normal.  Left no nodule or mass.  No erythema, but still mildly tender per patient.  Assessment/Plan:  61 y.o. G2P2002   1. Mastitis, left, acute Resolving on Augmentin x 7 days.  Will add 3 days of Augmentin given some tenderness on exam today.  Recommendations discussed to call and be reevaluated if Sxs of Mastitis come back.  F/U Annual/Gyn exam in 3 months.  2. Urinary frequency U/A wnl. - Urinalysis with Culture Reflex   Counseling on above issue >50% x 15 minutes  Princess Bruins MD, 9:49 AM 07/26/2017

## 2017-07-26 NOTE — Patient Instructions (Signed)
1. Mastitis, left, acute Resolving on Augmentin x 7 days.  Will add 3 days of Augmentin given some tenderness on exam today.  Recommendations discussed to call and be reevaluated if Sxs of Mastitis come back.  F/U Annual/Gyn exam in 3 months.  Cynthia Ferguson, good to see you today!

## 2017-07-27 ENCOUNTER — Ambulatory Visit (HOSPITAL_COMMUNITY): Payer: BLUE CROSS/BLUE SHIELD | Attending: Neurosurgery | Admitting: Physical Therapy

## 2017-07-27 DIAGNOSIS — M5416 Radiculopathy, lumbar region: Secondary | ICD-10-CM | POA: Diagnosis not present

## 2017-07-27 DIAGNOSIS — M25651 Stiffness of right hip, not elsewhere classified: Secondary | ICD-10-CM

## 2017-07-27 DIAGNOSIS — M6281 Muscle weakness (generalized): Secondary | ICD-10-CM | POA: Diagnosis present

## 2017-07-27 DIAGNOSIS — M25652 Stiffness of left hip, not elsewhere classified: Secondary | ICD-10-CM | POA: Diagnosis present

## 2017-07-27 NOTE — Patient Instructions (Addendum)
Scapular Retraction (Standing)   Sitting  With arms at in your lap, pinch shoulder blades down and  Together (towards your bra clasp). Repeat _10__ times per set. Do _1___ sets per session. Do 2____ sessions per day.  http://orth.exer.us/944   Copyright  VHI. All rights reserved.  Sit as tall as you can; pushing down with the heels of your feet.  Hold for five seconds and relax repeat 10 times.

## 2017-07-27 NOTE — Therapy (Signed)
[facility] [place], [place], [address] Phone: [phone]   Fax:  [phone]  Physical Therapy Evaluation  Patient Details  Name: Cynthia Ferguson MRN: [id number] Date of Birth: 11-10-1955 Referring Provider: Earnie Larsson  Encounter Date: 07/27/2017      PT End of Session - 07/27/17 1059    Visit Number 1   Number of Visits 8   Date for PT Re-Evaluation 08/26/17   Authorization Type BCBS   Authorization - Visit Number 1   Authorization - Number of Visits 8   PT Start Time 0810   PT Stop Time 0905   PT Time Calculation (min) 55 min   Activity Tolerance Patient tolerated treatment well   Behavior During Therapy [facility] for tasks assessed/performed      Past Medical History:  Diagnosis Date  . Anxiety   . Arthritis   . Bladder incontinence   . Cancer ([place]) 2014   left breast   . GERD (gastroesophageal reflux disease)   . Headache(784.0)    migraines  . High cholesterol   . Hypertension   . Personal history of radiation therapy 2014    Past Surgical History:  Procedure Laterality Date  . ABDOMINAL HYSTERECTOMY     COMPLETE   . BREAST EXCISIONAL BIOPSY Left    age 53...benign  . BREAST LUMPECTOMY Left 2014  . BREAST LUMPECTOMY WITH NEEDLE LOCALIZATION Left 07/11/2013   Procedure: BREAST LUMPECTOMY WITH NEEDLE LOCALIZATION;  Surgeon: Rolm Bookbinder, MD;  Location: [facility];  Service: General;  Laterality: Left;  needle localization at [facility] 11:00   . BREAST SURGERY     LEFT BREAST LUMPECTOMY - BENIGN  . BUNIONECTOMY  1993   LEFT FOOT  . BUNIONECTOMY Right NOVEMBER 2013   HAMMER TOE  . NECK SURGERY  2002   DISC REPLACED IN NECK  . right knee athroscopy     2006  . TUBAL LIGATION      There were no vitals filed for this visit.       Subjective Assessment - 07/27/17 0820    Subjective Cynthia Ferguson is a 61 yo female who states that approximately six months ago she began having leg pain  bilaterally then she felt pain in her buttock and her low back.   She was given steroids which cleared her pain up but very soon after she was through taking her medication she began having pain again.  She states that right now she is having the most pain in her neck. She states that this started about five days ago and she had to leave work because of her neck.    Pertinent History HTN Breast Cancer, cervical surgery, Rt knee scope    How long can you sit comfortably? less than ten minutes    How long can you stand comfortably? no problem    How long can you walk comfortably? increased pain after 20 minutes    Diagnostic tests HNP, spondlolithesis    Patient Stated Goals decreased pain    Currently in Pain? Yes   Pain Score 6    Pain Location Neck   Pain Orientation Lower   Pain Descriptors / Indicators Aching   Pain Type Acute pain   Pain Onset In the past 7 days   Pain Frequency Constant   Aggravating Factors  work    Pain Relieving Factors not sure    Effect of Pain on Daily Activities increases    Multiple  Pain Sites --  Pt leg pain has been a 14/10 but no pain now.             [facility] PT Assessment - 07/27/17 0001      Assessment   Medical Diagnosis Lumbar spondylolisthesis   Referring Provider Earnie Larsson   Onset Date/Surgical Date 01/23/17   Next MD Visit 08/24/2017  Pt gets an injection in her back on Tuesday then sees MD    Prior Therapy none      Precautions   Precautions None     Restrictions   Weight Bearing Restrictions No     Balance Screen   Has the patient fallen in the past 6 months No   Has the patient had a decrease in activity level because of a fear of falling?  Yes   Is the patient reluctant to leave their home because of a fear of falling?  No     Home Ec[employer] residence     Prior Function   Level of Independence Independent   Vocation Full time employment   Vocation Requirements microscope, standing , sitting     Leisure none      Cognition   Overall Cognitive Status Within Functional Limits for tasks assessed     Observation/Other Assessments   Focus on Therapeutic Outcomes (FOTO)  37     Posture/Postural Control   Posture/Postural Control Postural limitations   Postural Limitations Rounded Shoulders;Forward head;Decreased lumbar lordosis;Increased thoracic kyphosis     ROM / Strength   AROM / PROM / Strength AROM;Strength     AROM   AROM Assessment Site Hip;Lumbar   Right/Left Hip Right;Left   Right Hip Flexion 110   Right Hip External Rotation  40   Right Hip Internal Rotation  15   Left Hip Flexion 110   Left Hip External Rotation  45   Left Hip Internal Rotation  25   Lumbar Extension 15  reps causes decreased pain   Lumbar - Right Side Bend 15  reps causes increased pain    Lumbar - Left Side Bend 15  reps cause increase pain      Strength   Strength Assessment Site Hip;Knee;Ankle   Right/Left Hip Right;Left   Right Hip Flexion 5/5   Right Hip Extension 3/5   Right Hip ABduction 4+/5   Left Hip Flexion 3+/5   Left Hip Extension 3/5   Left Hip ABduction 3+/5   Right/Left Knee Right;Left   Right Knee Flexion 3+/5   Right Knee Extension 5/5   Left Knee Flexion 3+/5   Left Knee Extension 5/5   Right/Left Ankle Right;Left   Right Ankle Dorsiflexion 5/5   Left Ankle Dorsiflexion 5/5     Flexibility   Soft Tissue Assessment /Muscle Length yes   Hamstrings Rt:   140:  LT 155     Ambulation/Gait   Ambulation/Gait Yes   Assistive device None   Gait Comments decreased stride Bilaterally, decreased knee extension, decreased ankle dorsiflexion.             Objective measurements completed on examination: See above findings.          [facility] Adult PT Treatment/Exercise - 07/27/17 0001      Exercises   Exercises Lumbar     Lumbar Exercises: Seated   Other Seated Lumbar Exercises cervical, scapular retraction x 10    Other Seated Lumbar Exercises push feet  into floor raise up into erect posture x  10      Lumbar Exercises: Supine   Other Supine Lumbar Exercises decompression exercises 1-5                 PT Education - 07/27/17 1058    Education provided Yes   Education Details The importance of good posture    Person(s) Educated Patient   Methods Explanation   Comprehension Verbalized understanding          PT Short Term Goals - 07/27/17 1106      PT SHORT TERM GOAL #1   Title Pt pain in her legs to be no greater than a 6/10 to allow pt to be able to complete an hour of light housework without difficulty    Time 2   Period Weeks   Status New   Target Date 08/10/17     PT SHORT TERM GOAL #2   Title PT back and hip ROM to be increased to allow pt to be able to don her shoes and socks with ease    Time 2   Period Weeks   Status New     PT SHORT TERM GOAL #3   Title Pt core and LE strength to be increased by 1/2 grade to allow pt to come sit to stand and complete bed mobility with ease.    Time 2   Period Weeks   Status New           PT Long Term Goals - 07/27/17 1109      PT LONG TERM GOAL #1   Title Pt leg pain to be no greater than a 3/10 to allow pt to complete an hour and a half of housework including moderate activity such as vacuuming without increased pain    Time 4   Period Weeks   Status New   Target Date 08/24/17     PT LONG TERM GOAL #2   Title Pt back and hip ROM to be improved to allow pt to be able to pick items off the ground without difficulty    Time 4   Period Weeks   Status New     PT LONG TERM GOAL #3   Title Pt core and LE strength to be increased by one grade to allow pt to be able to climb up and down steps in a reciprocal manner without difficulty                 Plan - 07/27/17 1100    Clinical Impression Statement Cynthia Ferguson is a 61 yo female who has been experiencing significant B LE pain.  After testing it has been found that she has both bulging discs as well as  Spondylolisthesis in her lumbar spine.  She has been referred to skilled physical therapy.  Examination demonstrates decreased ROM, decreased strength, poor posture, decreased flexibility and increased pain.  Cynthia Ferguson will benefit from skilled physical therapy services to address these issues and maximize her functional ability.     History and Personal Factors relevant to plan of care: cervical surgery, HTN, Rt knee scope    Clinical Presentation Evolving   Clinical Decision Making Moderate   Rehab Potential Good   PT Frequency 2x / week   PT Duration 4 weeks   PT Treatment/Interventions ADLs/Self Care Home Management;Therapeutic activities;Therapeutic exercise;Functional mobility training;Gait training;Patient/family education;Manual techniques   PT Next Visit Plan Begin decompression exercises with t-band, pelvic tilt, knee to chest    PT Home Exercise Plan 07/27/2017:  scapular retraction, press heels into floor and sit up tall, decompression exercises 1-5       Patient will benefit from skilled therapeutic intervention in order to improve the following deficits and impairments:  Abnormal gait, Decreased activity tolerance, Pain, Postural dysfunction, Decreased strength, Decreased range of motion, Impaired flexibility  Visit Diagnosis: Radiculopathy, lumbar region - Plan: PT plan of care cert/re-cert  Muscle weakness (generalized) - Plan: PT plan of care cert/re-cert  Stiffness of right hip, not elsewhere classified - Plan: PT plan of care cert/re-cert  Stiffness of left hip, not elsewhere classified - Plan: PT plan of care cert/re-cert     Problem List Patient Active Problem List   Diagnosis Date Noted  . Vulvar lesion 11/09/2016  . Vulvar atrophy 11/09/2016  . Cystocele 06/08/2014  . Cancer of central portion of left female breast ([place]) 07/21/2013  . Rectocele 06/05/2013  . Menopause 06/05/2013  . Vaginal atrophy 06/05/2013  . HTN (hypertension) 06/05/2013  . Other and  unspecified hyperlipidemia 06/05/2013  . SUI (stress urinary incontinence, female) 05/07/2012  . Female cystocele 05/07/2012    Rayetta Humphrey, PT CLT [phone] 07/27/2017, 11:16 AM  [facility] [address] [place], [place], [address] Phone: [phone]   Fax:  [phone]  Name: Cynthia Ferguson MRN: [id number] Date of Birth: Jul 31, 1956

## 2017-07-28 LAB — URINALYSIS W MICROSCOPIC + REFLEX CULTURE
Bilirubin Urine: NEGATIVE
GLUCOSE, UA: NEGATIVE
Hgb urine dipstick: NEGATIVE
Hyaline Cast: NONE SEEN /LPF
Ketones, ur: NEGATIVE
NITRITES URINE, INITIAL: NEGATIVE
PROTEIN: NEGATIVE
RBC / HPF: NONE SEEN /HPF (ref 0–2)
Specific Gravity, Urine: 1.004 (ref 1.001–1.03)
pH: 5.5 (ref 5.0–8.0)

## 2017-07-28 LAB — CULTURE INDICATED

## 2017-07-28 LAB — URINE CULTURE
MICRO NUMBER:: 81046917
Result:: NO GROWTH
SPECIMEN QUALITY:: ADEQUATE

## 2017-07-30 ENCOUNTER — Ambulatory Visit (HOSPITAL_COMMUNITY): Payer: BLUE CROSS/BLUE SHIELD | Admitting: Physical Therapy

## 2017-07-30 DIAGNOSIS — M25652 Stiffness of left hip, not elsewhere classified: Secondary | ICD-10-CM

## 2017-07-30 DIAGNOSIS — M6281 Muscle weakness (generalized): Secondary | ICD-10-CM

## 2017-07-30 DIAGNOSIS — M5416 Radiculopathy, lumbar region: Secondary | ICD-10-CM

## 2017-07-30 DIAGNOSIS — M25651 Stiffness of right hip, not elsewhere classified: Secondary | ICD-10-CM

## 2017-07-30 NOTE — Therapy (Signed)
[facility] [place], [place], [address] Phone: [phone]   Fax:  [phone]  Physical Therapy Treatment  Patient Details  Name: Cynthia Ferguson MRN: [id number] Date of Birth: 11-Aug-1956 Referring Provider: Earnie Larsson  Encounter Date: 07/30/2017      PT End of Session - 07/30/17 1553    Visit Number 2   Number of Visits 8   Date for PT Re-Evaluation 08/26/17   Authorization Type BCBS   Authorization - Visit Number 2   Authorization - Number of Visits 8   PT Start Time 0950   PT Stop Time 1030   PT Time Calculation (min) 40 min   Activity Tolerance Patient tolerated treatment well   Behavior During Therapy [facility] for tasks assessed/performed      Past Medical History:  Diagnosis Date  . Anxiety   . Arthritis   . Bladder incontinence   . Cancer ([place]) 2014   left breast   . GERD (gastroesophageal reflux disease)   . Headache(784.0)    migraines  . High cholesterol   . Hypertension   . Personal history of radiation therapy 2014    Past Surgical History:  Procedure Laterality Date  . ABDOMINAL HYSTERECTOMY     COMPLETE   . BREAST EXCISIONAL BIOPSY Left    age 78...benign  . BREAST LUMPECTOMY Left 2014  . BREAST LUMPECTOMY WITH NEEDLE LOCALIZATION Left 07/11/2013   Procedure: BREAST LUMPECTOMY WITH NEEDLE LOCALIZATION;  Surgeon: Rolm Bookbinder, MD;  Location: [facility];  Service: General;  Laterality: Left;  needle localization at [facility] 11:00   . BREAST SURGERY     LEFT BREAST LUMPECTOMY - BENIGN  . BUNIONECTOMY  1993   LEFT FOOT  . BUNIONECTOMY Right NOVEMBER 2013   HAMMER TOE  . NECK SURGERY  2002   DISC REPLACED IN NECK  . right knee athroscopy     2006  . TUBAL LIGATION      There were no vitals filed for this visit.      Subjective Assessment - 07/30/17 1001    Subjective Pt states her neck is hurting worse at this time.  Currently pain in her lumbar 2/10 and in her neck 3/10.   States the pain her legs are not as bad, however had increased pain iher her legs after starting therapy on Friday.  States she's getting an injection in her lumbar spine tomorrow.     Currently in Pain? Yes   Pain Score 2    Pain Location Back   Pain Orientation Lower   Pain Descriptors / Indicators Aching   Pain Type Acute pain   Multiple Pain Sites Yes   Pain Score 3   Pain Location Neck   Pain Orientation Right;Mid   Pain Descriptors / Indicators Aching   Pain Onset 1 to 4 weeks ago   Pain Frequency Intermittent                         [facility] Adult PT Treatment/Exercise - 07/30/17 0001      Lumbar Exercises: Stretches   Single Knee to Chest Stretch 3 reps;20 seconds   Pelvic Tilt Limitations   Pelvic Tilt Limitations 10 reps     Lumbar Exercises: Supine   Other Supine Lumbar Exercises decompression exercises 1-5    Other Supine Lumbar Exercises decompression theraband GTB 5 reps each  PT Education - 07/30/17 1606    Education provided Yes   Education Details review of initial evaluation including goals and moving forward with plan of care.   Person(s) Educated Patient   Methods Explanation   Comprehension Verbalized understanding          PT Short Term Goals - 07/27/17 1106      PT SHORT TERM GOAL #1   Title Pt pain in her legs to be no greater than a 6/10 to allow pt to be able to complete an hour of light housework without difficulty    Time 2   Period Weeks   Status New   Target Date 08/10/17     PT SHORT TERM GOAL #2   Title PT back and hip ROM to be increased to allow pt to be able to don her shoes and socks with ease    Time 2   Period Weeks   Status New     PT SHORT TERM GOAL #3   Title Pt core and LE strength to be increased by 1/2 grade to allow pt to come sit to stand and complete bed mobility with ease.    Time 2   Period Weeks   Status New           PT Long Term Goals - 07/27/17 1109      PT LONG  TERM GOAL #1   Title Pt leg pain to be no greater than a 3/10 to allow pt to complete an hour and a half of housework including moderate activity such as vacuuming without increased pain    Time 4   Period Weeks   Status New   Target Date 08/24/17     PT LONG TERM GOAL #2   Title Pt back and hip ROM to be improved to allow pt to be able to pick items off the ground without difficulty    Time 4   Period Weeks   Status New     PT LONG TERM GOAL #3   Title Pt core and LE strength to be increased by one grade to allow pt to be able to climb up and down steps in a reciprocal manner without difficulty                Plan - 07/30/17 1553    Clinical Impression Statement Reviewed goals and provided with copy of initial evaluation.  pt unable to recall all of the decompression exercises independently this session.  Progressed with theraband decompression and provided with theraband and written intstructions for HEP.   Also added pelvic tilts and SKTC stretch.  Pt required verbal and manual cues to complete correctlly.  Pt also required frequent redirection as easilly distracted by conversation and gets off task.     Rehab Potential Good   PT Frequency 2x / week   PT Duration 4 weeks   PT Treatment/Interventions ADLs/Self Care Home Management;Therapeutic activities;Therapeutic exercise;Functional mobility training;Gait training;Patient/family education;Manual techniques   PT Next Visit Plan continue to progress towards goals with focus on decreasing pain.     PT Home Exercise Plan 07/27/2017:  scapular retraction, press heels into floor and sit up tall, decompression exercises 1-5       Patient will benefit from skilled therapeutic intervention in order to improve the following deficits and impairments:  Abnormal gait, Decreased activity tolerance, Pain, Postural dysfunction, Decreased strength, Decreased range of motion, Impaired flexibility  Visit Diagnosis: Radiculopathy, lumbar  region  Muscle weakness (generalized)  Stiffness of right hip, not elsewhere classified  Stiffness of left hip, not elsewhere classified     Problem List Patient Active Problem List   Diagnosis Date Noted  . Vulvar lesion 11/09/2016  . Vulvar atrophy 11/09/2016  . Cystocele 06/08/2014  . Cancer of central portion of left female breast ([place]) 07/21/2013  . Rectocele 06/05/2013  . Menopause 06/05/2013  . Vaginal atrophy 06/05/2013  . HTN (hypertension) 06/05/2013  . Other and unspecified hyperlipidemia 06/05/2013  . SUI (stress urinary incontinence, female) 05/07/2012  . Female cystocele 05/07/2012   Teena Irani, PTA/CLT [phone]  Teena Irani 07/30/2017, 4:22 PM  [facility] [address] [place], [place], [address] Phone: [phone]   Fax:  [phone]  Name: Cynthia Ferguson MRN: [id number] Date of Birth: 1955-12-08

## 2017-07-31 ENCOUNTER — Ambulatory Visit
Admission: RE | Admit: 2017-07-31 | Discharge: 2017-07-31 | Disposition: A | Discharge status: Home / Self Care | Payer: BLUE CROSS/BLUE SHIELD | Source: Ambulatory Visit | Attending: Neurosurgery | Admitting: Neurosurgery

## 2017-07-31 DIAGNOSIS — M4310 Spondylolisthesis, site unspecified: Secondary | ICD-10-CM

## 2017-07-31 MED ORDER — IOPAMIDOL (ISOVUE-M 200) INJECTION 41%
1.0000 mL | Freq: Once | INTRAMUSCULAR | Status: AC
  Administered 2017-07-31: 1 mL via EPIDURAL

## 2017-07-31 MED ORDER — METHYLPREDNISOLONE ACETATE 40 MG/ML INJ SUSP (RADIOLOG
120.0000 mg | Freq: Once | INTRAMUSCULAR | Status: AC
  Administered 2017-07-31: 120 mg via EPIDURAL

## 2017-07-31 NOTE — Discharge Instructions (Signed)

## 2017-08-03 ENCOUNTER — Ambulatory Visit (HOSPITAL_COMMUNITY): Payer: BLUE CROSS/BLUE SHIELD | Admitting: Physical Therapy

## 2017-08-03 DIAGNOSIS — M6281 Muscle weakness (generalized): Secondary | ICD-10-CM

## 2017-08-03 DIAGNOSIS — M25652 Stiffness of left hip, not elsewhere classified: Secondary | ICD-10-CM

## 2017-08-03 DIAGNOSIS — M5416 Radiculopathy, lumbar region: Secondary | ICD-10-CM

## 2017-08-03 DIAGNOSIS — M25651 Stiffness of right hip, not elsewhere classified: Secondary | ICD-10-CM

## 2017-08-03 NOTE — Patient Instructions (Signed)
Hamstring Stretch: Active    Support behind right knee. Starting with knee bent, attempt to straighten knee until a comfortable stretch is felt in back of thigh. Hold 30____ seconds. Repeat ___3_ times per set. Do _1___ sets per session. Do ___2_ sessions per day.  http://orth.exer.us/158   Copyright  VHI. All rights reserved.  Knee-to-Chest Stretch: Unilateral    With hand behind right knee, pull knee in to chest until a comfortable stretch is felt in lower back and buttocks. Keep back relaxed. Hold _30___ seconds. Repeat ___10_ times per set. Do _1___ sets per session. Do _2___ sessions per day.  http://orth.exer.us/126   Copyright  VHI. All rights reserved.  Pelvic Tilt: Posterior - Legs Bent (Supine)    Tighten stomach and flatten back by rolling pelvis down. Hold _5___ seconds. Relax. Repeat 10____ times per set. Do ____ sets per session. Do __2__ sessions per day. 1 http://orth.exer.us/202   Copyright  VHI. All rights reserved.  Gluteal Sets    Tighten buttocks while pressing pelvis to floor. Hold _3___ seconds. Repeat _10___ times per set. Do _1___ sets per session. Do _2___ sessions per day.  http://orth.exer.us/104   Copyright  VHI. All rights reserved.  Heel Squeeze (Prone)    Abdomen supported, bend knees and gently squeeze heels together. Hold __5__ seconds. Repeat __10__ times per set. Do _1___ sets per session. Do _2___ sessions per day.  http://orth.exer.us/1080   Copyright  VHI. All rights reserved.

## 2017-08-03 NOTE — Therapy (Signed)
[place], [place], [address] Phone: [phone]   Fax:  [phone]  Physical Therapy Treatment  Patient Details  Name: Cynthia Ferguson MRN: [id number] Date of Birth: 1956-06-25 Referring Provider: Earnie Larsson  Encounter Date: 08/03/2017      PT End of Session - 08/03/17 0932    Visit Number 3   Number of Visits 8   Date for PT Re-Evaluation 08/26/17   Authorization Type BCBS   Authorization - Visit Number 3   Authorization - Number of Visits 8   PT Start Time 0910   PT Stop Time 0950   PT Time Calculation (min) 40 min   Activity Tolerance Patient tolerated treatment well   Behavior During Therapy [facility] for tasks assessed/performed      Past Medical History:  Diagnosis Date  . Anxiety   . Arthritis   . Bladder incontinence   . Cancer ([place]) 2014   left breast   . GERD (gastroesophageal reflux disease)   . Headache(784.0)    migraines  . High cholesterol   . Hypertension   . Personal history of radiation therapy 2014    Past Surgical History:  Procedure Laterality Date  . ABDOMINAL HYSTERECTOMY     COMPLETE   . BREAST EXCISIONAL BIOPSY Left    age 51...benign  . BREAST LUMPECTOMY Left 2014  . BREAST LUMPECTOMY WITH NEEDLE LOCALIZATION Left 07/11/2013   Procedure: BREAST LUMPECTOMY WITH NEEDLE LOCALIZATION;  Surgeon: Rolm Bookbinder, MD;  Location: [place];  Service: General;  Laterality: Left;  needle localization at [facility] 11:00   . BREAST SURGERY     LEFT BREAST LUMPECTOMY - BENIGN  . BUNIONECTOMY  1993   LEFT FOOT  . BUNIONECTOMY Right NOVEMBER 2013   HAMMER TOE  . NECK SURGERY  2002   DISC REPLACED IN NECK  . right knee athroscopy     2006  . TUBAL LIGATION      There were no vitals filed for this visit.      Subjective Assessment - 08/03/17 0912    Subjective Pt had a shot in her back and has been better.  It was 100% better for two days after the shot but now she  would say that she is 30% betteer    Pertinent History HTN Breast Cancer, cervical surgery, Rt knee scope    How long can you sit comfortably? less than ten minutes    How long can you stand comfortably? no problem    How long can you walk comfortably? increased pain after 20 minutes    Diagnostic tests HNP, spondlolithesis    Patient Stated Goals decreased pain    Pain Score 1   leg pain 4/10    Pain Location Back   Pain Orientation Lower   Pain Descriptors / Indicators Aching   Pain Onset In the past 7 days                         [facility] Adult PT Treatment/Exercise - 08/03/17 0001      Exercises   Exercises Lumbar     Lumbar Exercises: Stretches   Active Hamstring Stretch 3 reps;30 seconds   Single Knee to Chest Stretch 3 reps;30 seconds   Pelvic Tilt Limitations 10 reps   Prone on Elbows Stretch 3 reps;30 seconds   Piriformis Stretch Limitations slant board stretch  2      Lumbar Exercises: Standing  Other Standing Lumbar Exercises 3 D hip excursion x 3     Lumbar Exercises: Seated   Other Seated Lumbar Exercises cervical, scapular retraction x 10    Other Seated Lumbar Exercises cervical and thoracic excursion x 10      Lumbar Exercises: Supine   Ab Set 10 reps     Lumbar Exercises: Prone   Straight Leg Raise 10 reps   Other Prone Lumbar Exercises glut set x 10 ; heel squeeze x 10                   PT Short Term Goals - 08/03/17 0940      PT SHORT TERM GOAL #1   Title Pt pain in her legs to be no greater than a 6/10 to allow pt to be able to complete an hour of light housework without difficulty    Time 2   Period Weeks   Status On-going     PT SHORT TERM GOAL #2   Title PT back and hip ROM to be increased to allow pt to be able to don her shoes and socks with ease    Time 2   Period Weeks   Status On-going     PT SHORT TERM GOAL #3   Title Pt core and LE strength to be increased by 1/2 grade to allow pt to come sit to stand and  complete bed mobility with ease.    Time 2   Period Weeks   Status On-going           PT Long Term Goals - 08/03/17 0941      PT LONG TERM GOAL #1   Title Pt leg pain to be no greater than a 3/10 to allow pt to complete an hour and a half of housework including moderate activity such as vacuuming without increased pain    Time 4   Period Weeks   Status On-going     PT LONG TERM GOAL #2   Title Pt back and hip ROM to be improved to allow pt to be able to pick items off the ground without difficulty    Time 4   Period Weeks   Status On-going     PT LONG TERM GOAL #3   Title Pt core and LE strength to be increased by one grade to allow pt to be able to climb up and down steps in a reciprocal manner without difficulty    Status On-going               Plan - 08/03/17 0932    Clinical Impression Statement Added prone exercises and additional flexibility to pt program. Updated HEP.  Pt improving in her form when completing the exercises. 0   Rehab Potential Good   PT Frequency 2x / week   PT Duration 4 weeks   PT Treatment/Interventions ADLs/Self Care Home Management;Therapeutic activities;Therapeutic exercise;Functional mobility training;Gait training;Patient/family education;Manual techniques   PT Next Visit Plan begin bent knee raise and bridges to continue to progress towards goals with focus on decreasing pain.     PT Home Exercise Plan 07/27/2017:  scapular retraction, press heels into floor and sit up tall, decompression exercises 1-5       Patient will benefit from skilled therapeutic intervention in order to improve the following deficits and impairments:  Abnormal gait, Decreased activity tolerance, Pain, Postural dysfunction, Decreased strength, Decreased range of motion, Impaired flexibility  Visit Diagnosis: Radiculopathy, lumbar region  Muscle weakness (  generalized)  Stiffness of right hip, not elsewhere classified  Stiffness of left hip, not elsewhere  classified     Problem List Patient Active Problem List   Diagnosis Date Noted  . Vulvar lesion 11/09/2016  . Vulvar atrophy 11/09/2016  . Cystocele 06/08/2014  . Cancer of central portion of left female breast ([place]) 07/21/2013  . Rectocele 06/05/2013  . Menopause 06/05/2013  . Vaginal atrophy 06/05/2013  . HTN (hypertension) 06/05/2013  . Other and unspecified hyperlipidemia 06/05/2013  . SUI (stress urinary incontinence, female) 05/07/2012  . Female cystocele 05/07/2012    Rayetta Humphrey, PT CLT [phone] 08/03/2017, 11:38 AM  [place] [address] [place], [place], [address] Phone: [phone]   Fax:  [phone]  Name: MARZELLA MIRACLE MRN: [id number] Date of Birth: [birth year]

## 2017-08-07 ENCOUNTER — Ambulatory Visit (HOSPITAL_COMMUNITY): Payer: BLUE CROSS/BLUE SHIELD | Attending: Neurosurgery | Admitting: Physical Therapy

## 2017-08-07 DIAGNOSIS — M6281 Muscle weakness (generalized): Secondary | ICD-10-CM | POA: Diagnosis present

## 2017-08-07 DIAGNOSIS — M25651 Stiffness of right hip, not elsewhere classified: Secondary | ICD-10-CM | POA: Diagnosis present

## 2017-08-07 DIAGNOSIS — M25652 Stiffness of left hip, not elsewhere classified: Secondary | ICD-10-CM

## 2017-08-07 DIAGNOSIS — M5416 Radiculopathy, lumbar region: Secondary | ICD-10-CM | POA: Insufficient documentation

## 2017-08-07 NOTE — Therapy (Signed)
[place], [place], [address] Phone: [phone]   Fax:  [phone]  Physical Therapy Treatment  Patient Details  Name: Cynthia Ferguson MRN: [id number] Date of Birth: 12-26-1955 Referring Provider: Earnie Larsson  Encounter Date: 08/07/2017      PT End of Session - 08/07/17 0940    Visit Number 4   Number of Visits 8   Date for PT Re-Evaluation 08/26/17   Authorization Type BCBS   Authorization - Visit Number 4   Authorization - Number of Visits 8   PT Start Time 0905   PT Stop Time 0945   PT Time Calculation (min) 40 min   Activity Tolerance Patient tolerated treatment well   Behavior During Therapy Cynthia Ferguson for tasks assessed/performed      Past Medical History:  Diagnosis Date  . Anxiety   . Arthritis   . Bladder incontinence   . Cancer ([place]) 2014   left breast   . GERD (gastroesophageal reflux disease)   . Headache(784.0)    migraines  . High cholesterol   . Hypertension   . Personal history of radiation therapy 2014    Past Surgical History:  Procedure Laterality Date  . ABDOMINAL HYSTERECTOMY     COMPLETE   . BREAST EXCISIONAL BIOPSY Left    age 33...benign  . BREAST LUMPECTOMY Left 2014  . BREAST LUMPECTOMY WITH NEEDLE LOCALIZATION Left 07/11/2013   Procedure: BREAST LUMPECTOMY WITH NEEDLE LOCALIZATION;  Surgeon: Rolm Bookbinder, MD;  Location: [facility];  Service: General;  Laterality: Left;  needle localization at [facility] 11:00   . BREAST SURGERY     LEFT BREAST LUMPECTOMY - BENIGN  . BUNIONECTOMY  1993   LEFT FOOT  . BUNIONECTOMY Right NOVEMBER 2013   HAMMER TOE  . NECK SURGERY  2002   DISC REPLACED IN NECK  . right knee athroscopy     2006  . TUBAL LIGATION      There were no vitals filed for this visit.      Subjective Assessment - 08/07/17 0915    Subjective Pt states she hurt so bad when she got out of bed she had to grab to furniture to walk.   5/10 pain in her LB  and down both LE's.  states hot shower helps reduce pain.  States she got a new recliner that may have been the culprit.    Currently in Pain? Yes   Pain Score 5    Pain Location Back   Pain Orientation Lower   Pain Descriptors / Indicators Aching;Tightness                         [facility] Adult PT Treatment/Exercise - 08/07/17 0001      Lumbar Exercises: Stretches   Active Hamstring Stretch 3 reps;30 seconds   Single Knee to Chest Stretch 3 reps;30 seconds   Prone on Elbows Stretch 2 reps;60 seconds   Press Ups 5 reps   Piriformis Stretch Limitations slant board stretch  3X30" holds     Lumbar Exercises: Standing   Other Standing Lumbar Exercises 3 D hip excursion x  10reps     Lumbar Exercises: Supine   Bent Knee Raise 10 reps   Bridge 10 reps     Lumbar Exercises: Prone   Straight Leg Raise 10 reps   Other Prone Lumbar Exercises glut set x 10 ; heel squeeze x 10  PT Short Term Goals - 08/03/17 0940      PT SHORT TERM GOAL #1   Title Pt pain in her legs to be no greater than a 6/10 to allow pt to be able to complete an hour of light housework without difficulty    Time 2   Period Weeks   Status On-going     PT SHORT TERM GOAL #2   Title PT back and hip ROM to be increased to allow pt to be able to don her shoes and socks with ease    Time 2   Period Weeks   Status On-going     PT SHORT TERM GOAL #3   Title Pt core and LE strength to be increased by 1/2 grade to allow pt to come sit to stand and complete bed mobility with ease.    Time 2   Period Weeks   Status On-going           PT Long Term Goals - 08/03/17 0941      PT LONG TERM GOAL #1   Title Pt leg pain to be no greater than a 3/10 to allow pt to complete an hour and a half of housework including moderate activity such as vacuuming without increased pain    Time 4   Period Weeks   Status On-going     PT LONG TERM GOAL #2   Title Pt back and hip ROM to be  improved to allow pt to be able to pick items off the ground without difficulty    Time 4   Period Weeks   Status On-going     PT LONG TERM GOAL #3   Title Pt core and LE strength to be increased by one grade to allow pt to be able to climb up and down steps in a reciprocal manner without difficulty    Status On-going               Plan - 08/07/17 0940    Clinical Impression Statement Began with prone following standing excursions and gastroc stretch.  Added press ups to encourage extension.  Pt reported no discomfort with overall improvement in symptoms following press ups and prone lying.  ENcouraged pateint to spend more time in prone at home.  Added bridging and bent knee raises per plan and continued with established therex with minimal cues needed.    Rehab Potential Good   PT Frequency 2x / week   PT Duration 4 weeks   PT Treatment/Interventions ADLs/Self Care Home Management;Therapeutic activities;Therapeutic exercise;Functional mobility training;Gait training;Patient/family education;Manual techniques   PT Next Visit Plan Continue to progress towards goals with focus on decreasing pain.  Follow up on compliance with prone exercises.    PT Home Exercise Plan 07/27/2017:  scapular retraction, press heels into floor and sit up tall, decompression exercises 1-5       Patient will benefit from skilled therapeutic intervention in order to improve the following deficits and impairments:  Abnormal gait, Decreased activity tolerance, Pain, Postural dysfunction, Decreased strength, Decreased range of motion, Impaired flexibility  Visit Diagnosis: Radiculopathy, lumbar region  Muscle weakness (generalized)  Stiffness of right hip, not elsewhere classified  Stiffness of left hip, not elsewhere classified     Problem List Patient Active Problem List   Diagnosis Date Noted  . Vulvar lesion 11/09/2016  . Vulvar atrophy 11/09/2016  . Cystocele 06/08/2014  . Cancer of central  portion of left female breast ([place]) 07/21/2013  . Rectocele  06/05/2013  . Menopause 06/05/2013  . Vaginal atrophy 06/05/2013  . HTN (hypertension) 06/05/2013  . Other and unspecified hyperlipidemia 06/05/2013  . SUI (stress urinary incontinence, female) 05/07/2012  . Female cystocele 05/07/2012   Teena Irani, PTA/CLT [phone]  Teena Irani 08/07/2017, 9:46 AM  [place], [place], [address] Phone: [phone]   Fax:  [phone]  Name: Cynthia Ferguson MRN: [id number] Date of Birth: 09/22/1956

## 2017-08-09 ENCOUNTER — Ambulatory Visit (HOSPITAL_COMMUNITY): Payer: BLUE CROSS/BLUE SHIELD

## 2017-08-09 DIAGNOSIS — M25652 Stiffness of left hip, not elsewhere classified: Secondary | ICD-10-CM

## 2017-08-09 DIAGNOSIS — M5416 Radiculopathy, lumbar region: Secondary | ICD-10-CM | POA: Diagnosis not present

## 2017-08-09 DIAGNOSIS — M25651 Stiffness of right hip, not elsewhere classified: Secondary | ICD-10-CM

## 2017-08-09 DIAGNOSIS — M6281 Muscle weakness (generalized): Secondary | ICD-10-CM

## 2017-08-09 NOTE — Therapy (Signed)
[place], [place], [address] Phone: [phone]   Fax:  [phone]  Physical Therapy Treatment  Patient Details  Name: Cynthia BUDZIK MRN: [id number] Date of Birth: 02-Feb-1956 Referring Provider: Earnie Larsson  Encounter Date: 08/09/2017      PT End of Session - 08/09/17 0910    Visit Number 5   Number of Visits 8   Date for PT Re-Evaluation 08/26/17   Authorization Type BCBS   Authorization - Visit Number 5   Authorization - Number of Visits 8   PT Start Time 0902   PT Stop Time 0942   PT Time Calculation (min) 40 min   Activity Tolerance Patient tolerated treatment well   Behavior During Therapy [facility] for tasks assessed/performed      Past Medical History:  Diagnosis Date  . Anxiety   . Arthritis   . Bladder incontinence   . Cancer ([place]) 2014   left breast   . GERD (gastroesophageal reflux disease)   . Headache(784.0)    migraines  . High cholesterol   . Hypertension   . Personal history of radiation therapy 2014    Past Surgical History:  Procedure Laterality Date  . ABDOMINAL HYSTERECTOMY     COMPLETE   . BREAST EXCISIONAL BIOPSY Left    age 61...benign  . BREAST LUMPECTOMY Left 2014  . BREAST LUMPECTOMY WITH NEEDLE LOCALIZATION Left 07/11/2013   Procedure: BREAST LUMPECTOMY WITH NEEDLE LOCALIZATION;  Surgeon: Rolm Bookbinder, MD;  Location: [facility];  Service: General;  Laterality: Left;  needle localization at [facility] 11:00   . BREAST SURGERY     LEFT BREAST LUMPECTOMY - BENIGN  . BUNIONECTOMY  1993   LEFT FOOT  . BUNIONECTOMY Right NOVEMBER 2013   HAMMER TOE  . NECK SURGERY  2002   DISC REPLACED IN NECK  . right knee athroscopy     2006  . TUBAL LIGATION      There were no vitals filed for this visit.      Subjective Assessment - 08/09/17 0904    Subjective Pt reports things are going ok. HEP is good. Legs a little sore.    Pertinent History HTN Breast Cancer,  cervical surgery, Rt knee scope    Currently in Pain? Yes   Pain Score 1   low back    Pain Location Back                         [facility] Adult PT Treatment/Exercise - 08/09/17 0001      Exercises   Exercises Lumbar     Lumbar Exercises: Stretches   Active Hamstring Stretch 3 reps;30 seconds   Active Hamstring Stretch Limitations Calf Stretch:   slant board stretch  3X30" holds   Single Knee to Chest Stretch --   Lower Trunk Rotation --  seated extension + rotation A/ROM: x15 bilat   Prone on Elbows Stretch 2 reps;60 seconds   Press Ups --  15 reps at // bars in weight bearing.      Lumbar Exercises: Standing   Functional Squats --  2x10   Other Standing Lumbar Exercises hip extension, straight knee: 2x15 @ //bars  hip extension, straight knee: 2x15 @ //bars   Other Standing Lumbar Exercises SLS balance 10x3sH alternating sides, contrlateraql hip @ 90* flexion, VC fo rupright posture  retroAMB 2x30, lateral stepping (speed, semisquat) 2x30     Lumbar Exercises: Seated  Hip Flexion on Ball Strengthening  2x10, upright posture, dynadisc on chair                  PT Short Term Goals - 08/03/17 0940      PT SHORT TERM GOAL #1   Title Pt pain in her legs to be no greater than a 6/10 to allow pt to be able to complete an hour of light housework without difficulty    Time 2   Period Weeks   Status On-going     PT SHORT TERM GOAL #2   Title PT back and hip ROM to be increased to allow pt to be able to don her shoes and socks with ease    Time 2   Period Weeks   Status On-going     PT SHORT TERM GOAL #3   Title Pt core and LE strength to be increased by 1/2 grade to allow pt to come sit to stand and complete bed mobility with ease.    Time 2   Period Weeks   Status On-going           PT Long Term Goals - 08/03/17 0941      PT LONG TERM GOAL #1   Title Pt leg pain to be no greater than a 3/10 to allow pt to complete an hour and a half  of housework including moderate activity such as vacuuming without increased pain    Time 4   Period Weeks   Status On-going     PT LONG TERM GOAL #2   Title Pt back and hip ROM to be improved to allow pt to be able to pick items off the ground without difficulty    Time 4   Period Weeks   Status On-going     PT LONG TERM GOAL #3   Title Pt core and LE strength to be increased by one grade to allow pt to be able to climb up and down steps in a reciprocal manner without difficulty    Status On-going               Plan - 08/09/17 0911    Clinical Impression Statement Continued with generalized LE strengthening based on deficits in eval, core stabilization motor control, adn trnasition to more functional movements, incorporating postural education education. Pt responding well, excellent demonstration of strength and motor control. No increase in pain. Good progress toward goals overall   Rehab Potential Good   PT Frequency 2x / week   PT Duration 4 weeks   PT Treatment/Interventions ADLs/Self Care Home Management;Therapeutic activities;Therapeutic exercise;Functional mobility training;Gait training;Patient/family education;Manual techniques   PT Next Visit Plan If pain remains controlled, continue with transition to core stabilizaton and functional mobility strengthening.    PT Home Exercise Plan 07/27/2017:  scapular retraction, press heels into floor and sit up tall, decompression exercises 1-5    Consulted and Agree with Plan of Care Patient      Patient will benefit from skilled therapeutic intervention in order to improve the following deficits and impairments:  Abnormal gait, Decreased activity tolerance, Pain, Postural dysfunction, Decreased strength, Decreased range of motion, Impaired flexibility  Visit Diagnosis: Radiculopathy, lumbar region  Muscle weakness (generalized)  Stiffness of right hip, not elsewhere classified  Stiffness of left hip, not elsewhere  classified     Problem List Patient Active Problem List   Diagnosis Date Noted  . Vulvar lesion 11/09/2016  . Vulvar atrophy 11/09/2016  . Cystocele  06/08/2014  . Cancer of central portion of left female breast ([place]) 07/21/2013  . Rectocele 06/05/2013  . Menopause 06/05/2013  . Vaginal atrophy 06/05/2013  . HTN (hypertension) 06/05/2013  . Other and unspecified hyperlipidemia 06/05/2013  . SUI (stress urinary incontinence, female) 05/07/2012  . Female cystocele 05/07/2012    9:39 AM, 08/09/17 Etta Grandchild, PT, DPT Physical Therapist at [facility] [phone] (office)      Etta Grandchild 08/09/2017, 9:39 AM  [place] [address] [place], [place], [address] Phone: [phone]   Fax:  [phone]  Name: LERA Ferguson MRN: [id number] Date of Birth: 12/21/1955

## 2017-08-14 ENCOUNTER — Ambulatory Visit (HOSPITAL_COMMUNITY): Payer: BLUE CROSS/BLUE SHIELD | Admitting: Physical Therapy

## 2017-08-14 DIAGNOSIS — M5416 Radiculopathy, lumbar region: Secondary | ICD-10-CM

## 2017-08-14 DIAGNOSIS — M6281 Muscle weakness (generalized): Secondary | ICD-10-CM

## 2017-08-14 DIAGNOSIS — M25651 Stiffness of right hip, not elsewhere classified: Secondary | ICD-10-CM

## 2017-08-14 DIAGNOSIS — M25652 Stiffness of left hip, not elsewhere classified: Secondary | ICD-10-CM

## 2017-08-14 NOTE — Therapy (Signed)
[facility] [place], [place], [address] Phone: [phone]   Fax:  [phone]  Physical Therapy Treatment  Patient Details  Name: Cynthia Ferguson MRN: [id number] Date of Birth: 1956/05/30 Referring Provider: Earnie Larsson  Encounter Date: 08/14/2017      PT End of Session - 08/14/17 0939    Visit Number 6   Number of Visits 8   Date for PT Re-Evaluation 08/26/17   Authorization Type BCBS   Authorization - Visit Number 6   Authorization - Number of Visits 8   PT Start Time 0905   PT Stop Time 0945   PT Time Calculation (min) 40 min   Activity Tolerance Patient tolerated treatment well   Behavior During Therapy [facility] for tasks assessed/performed      Past Medical History:  Diagnosis Date  . Anxiety   . Arthritis   . Bladder incontinence   . Cancer ([place]) 2014   left breast   . GERD (gastroesophageal reflux disease)   . Headache(784.0)    migraines  . High cholesterol   . Hypertension   . Personal history of radiation therapy 2014    Past Surgical History:  Procedure Laterality Date  . ABDOMINAL HYSTERECTOMY     COMPLETE   . BREAST EXCISIONAL BIOPSY Left    age 38...benign  . BREAST LUMPECTOMY Left 2014  . BREAST LUMPECTOMY WITH NEEDLE LOCALIZATION Left 07/11/2013   Procedure: BREAST LUMPECTOMY WITH NEEDLE LOCALIZATION;  Surgeon: Rolm Bookbinder, MD;  Location: [place];  Service: General;  Laterality: Left;  needle localization at [facility] 11:00   . BREAST SURGERY     LEFT BREAST LUMPECTOMY - BENIGN  . BUNIONECTOMY  1993   LEFT FOOT  . BUNIONECTOMY Right NOVEMBER 2013   HAMMER TOE  . NECK SURGERY  2002   DISC REPLACED IN NECK  . right knee athroscopy     2006  . TUBAL LIGATION      There were no vitals filed for this visit.      Subjective Assessment - 08/14/17 0903    Subjective Pt states that she is doing her exercises about four times a week.  She is not having any leg pain today.     Pertinent History HTN Breast Cancer, cervical surgery, Rt knee scope    Currently in Pain? Yes   Pain Score 2    Pain Location Back   Pain Orientation Lower   Pain Descriptors / Indicators Aching   Pain Type Acute pain                         [facility] Adult PT Treatment/Exercise - 08/14/17 0001      Lumbar Exercises: Stretches   Active Hamstring Stretch 3 reps;30 seconds   Single Knee to Chest Stretch 10 seconds   Double Knee to Chest Stretch 5 reps   Lower Trunk Rotation 5 reps   Pelvic Tilt 5 reps  x2     Lumbar Exercises: Supine   Bridge 15 reps   Straight Leg Raise 10 reps   Other Supine Lumbar Exercises decompression 1-5   Other Supine Lumbar Exercises t-band decompression      Lumbar Exercises: Sidelying   Hip Abduction 10 reps     Lumbar Exercises: prone    Straight Leg Raise 10 reps                  PT Short Term Goals -  08/03/17 0940      PT SHORT TERM GOAL #1   Title Pt pain in her legs to be no greater than a 6/10 to allow pt to be able to complete an hour of light housework without difficulty    Time 2   Period Weeks   Status On-going     PT SHORT TERM GOAL #2   Title PT back and hip ROM to be increased to allow pt to be able to don her shoes and socks with ease    Time 2   Period Weeks   Status On-going     PT SHORT TERM GOAL #3   Title Pt core and LE strength to be increased by 1/2 grade to allow pt to come sit to stand and complete bed mobility with ease.    Time 2   Period Weeks   Status On-going           PT Long Term Goals - 08/03/17 0941      PT LONG TERM GOAL #1   Title Pt leg pain to be no greater than a 3/10 to allow pt to complete an hour and a half of housework including moderate activity such as vacuuming without increased pain    Time 4   Period Weeks   Status On-going     PT LONG TERM GOAL #2   Title Pt back and hip ROM to be improved to allow pt to be able to pick items off the ground without  difficulty    Time 4   Period Weeks   Status On-going     PT LONG TERM GOAL #3   Title Pt core and LE strength to be increased by one grade to allow pt to be able to climb up and down steps in a reciprocal manner without difficulty    Status On-going               Plan - 08/14/17 0940    Clinical Impression Statement Reviewed t-band decompression exercises as pt did not remember doing them.  Pt given a handout to complete at home.  Added stabiliization and stretches to program with verbal and manual cuing needed for proper technique.    Rehab Potential Good   PT Frequency 2x / week   PT Duration 4 weeks   PT Treatment/Interventions ADLs/Self Care Home Management;Therapeutic activities;Therapeutic exercise;Functional mobility training;Gait training;Patient/family education;Manual techniques   PT Next Visit Plan begin weight bearing stabilizaion exercises.    PT Home Exercise Plan 07/27/2017:  scapular retraction, press heels into floor and sit up tall, decompression exercises 1-5    Consulted and Agree with Plan of Care Patient      Patient will benefit from skilled therapeutic intervention in order to improve the following deficits and impairments:  Abnormal gait, Decreased activity tolerance, Pain, Postural dysfunction, Decreased strength, Decreased range of motion, Impaired flexibility  Visit Diagnosis: Radiculopathy, lumbar region  Muscle weakness (generalized)  Stiffness of right hip, not elsewhere classified  Stiffness of left hip, not elsewhere classified     Problem List Patient Active Problem List   Diagnosis Date Noted  . Vulvar lesion 11/09/2016  . Vulvar atrophy 11/09/2016  . Cystocele 06/08/2014  . Cancer of central portion of left female breast ([place]) 07/21/2013  . Rectocele 06/05/2013  . Menopause 06/05/2013  . Vaginal atrophy 06/05/2013  . HTN (hypertension) 06/05/2013  . Other and unspecified hyperlipidemia 06/05/2013  . SUI (stress urinary  incontinence, female) 05/07/2012  . Female  cystocele 05/07/2012  Rayetta Humphrey, PT CLT [phone] 08/14/2017, 12:18 PM  [facility] [address] [place], [place], [address] Phone: [phone]   Fax:  [phone]  Name: Cynthia Ferguson MRN: [id number] Date of Birth: 1956/01/05

## 2017-08-16 ENCOUNTER — Ambulatory Visit (HOSPITAL_COMMUNITY): Payer: BLUE CROSS/BLUE SHIELD | Admitting: Physical Therapy

## 2017-08-16 DIAGNOSIS — M25651 Stiffness of right hip, not elsewhere classified: Secondary | ICD-10-CM

## 2017-08-16 DIAGNOSIS — M6281 Muscle weakness (generalized): Secondary | ICD-10-CM

## 2017-08-16 DIAGNOSIS — M25652 Stiffness of left hip, not elsewhere classified: Secondary | ICD-10-CM

## 2017-08-16 DIAGNOSIS — M5416 Radiculopathy, lumbar region: Secondary | ICD-10-CM

## 2017-08-16 NOTE — Patient Instructions (Signed)
Piriformis Stretch, Sitting    Sit, one ankle on opposite knee, same-side hand on crossed knee. Push down on knee, keeping spine straight. Lean torso forward, with flat back, until tension is felt in hamstrings and gluteals of crossed-leg side. Hold _20_ seconds.  Repeat _3__ times per session. Do _2__ sessions per day.

## 2017-08-16 NOTE — Therapy (Signed)
[facility] [place], [place], [address] Phone: [phone]   Fax:  [phone]  Physical Therapy Treatment  Patient Details  Name: Cynthia Ferguson MRN: [id number] Date of Birth: Aug 08, 1956 Referring Provider: Earnie Larsson  Encounter Date: 08/16/2017      PT End of Session - 08/16/17 0946    Visit Number 7   Number of Visits 8   Date for PT Re-Evaluation 08/26/17   Authorization Type BCBS   Authorization - Visit Number 7   Authorization - Number of Visits 8   PT Start Time 0900   PT Stop Time 0945   PT Time Calculation (min) 45 min   Activity Tolerance Patient tolerated treatment well   Behavior During Therapy [facility] for tasks assessed/performed      Past Medical History:  Diagnosis Date  . Anxiety   . Arthritis   . Bladder incontinence   . Cancer ([place]) 2014   left breast   . GERD (gastroesophageal reflux disease)   . Headache(784.0)    migraines  . High cholesterol   . Hypertension   . Personal history of radiation therapy 2014    Past Surgical History:  Procedure Laterality Date  . ABDOMINAL HYSTERECTOMY     COMPLETE   . BREAST EXCISIONAL BIOPSY Left    age 72...benign  . BREAST LUMPECTOMY Left 2014  . BREAST LUMPECTOMY WITH NEEDLE LOCALIZATION Left 07/11/2013   Procedure: BREAST LUMPECTOMY WITH NEEDLE LOCALIZATION;  Surgeon: Rolm Bookbinder, MD;  Location: [place];  Service: General;  Laterality: Left;  needle localization at [facility] 11:00   . BREAST SURGERY     LEFT BREAST LUMPECTOMY - BENIGN  . BUNIONECTOMY  1993   LEFT FOOT  . BUNIONECTOMY Right NOVEMBER 2013   HAMMER TOE  . NECK SURGERY  2002   DISC REPLACED IN NECK  . right knee athroscopy     2006  . TUBAL LIGATION      There were no vitals filed for this visit.      Subjective Assessment - 08/16/17 0911    Subjective Pt states she's only having stiffness currently.  did have some pain down posterior Bilateral LE's this  morning and had to get up and walk about 20 minutes in the middle of the night.     Currently in Pain? No/denies                         [facility] Adult PT Treatment/Exercise - 08/16/17 0001      Lumbar Exercises: Stretches   Single Knee to Chest Stretch 5 reps;20 seconds   Double Knee to Chest Stretch 5 reps;20 seconds   Lower Trunk Rotation 5 reps;20 seconds   Piriformis Stretch 2 reps;30 seconds   Piriformis Stretch Limitations seated     Lumbar Exercises: Supine   Bridge 15 reps   Straight Leg Raise 10 reps   Other Supine Lumbar Exercises decompression 1-5   Other Supine Lumbar Exercises t-band decompression      Lumbar Exercises: Prone   Straight Leg Raise 10 reps                PT Education - 08/16/17 0945    Education provided Yes   Education Details instructed to continue HEP.  given written instructions for piriformis stretch.    Person(s) Educated Patient   Methods Explanation;Demonstration;Verbal cues;Tactile cues;Handout   Comprehension Verbalized understanding;Returned demonstration;Verbal cues required;Tactile cues required  PT Short Term Goals - 08/03/17 0940      PT SHORT TERM GOAL #1   Title Pt pain in her legs to be no greater than a 6/10 to allow pt to be able to complete an hour of light housework without difficulty    Time 2   Period Weeks   Status On-going     PT SHORT TERM GOAL #2   Title PT back and hip ROM to be increased to allow pt to be able to don her shoes and socks with ease    Time 2   Period Weeks   Status On-going     PT SHORT TERM GOAL #3   Title Pt core and LE strength to be increased by 1/2 grade to allow pt to come sit to stand and complete bed mobility with ease.    Time 2   Period Weeks   Status On-going           PT Long Term Goals - 08/03/17 0941      PT LONG TERM GOAL #1   Title Pt leg pain to be no greater than a 3/10 to allow pt to complete an hour and a half of housework including  moderate activity such as vacuuming without increased pain    Time 4   Period Weeks   Status On-going     PT LONG TERM GOAL #2   Title Pt back and hip ROM to be improved to allow pt to be able to pick items off the ground without difficulty    Time 4   Period Weeks   Status On-going     PT LONG TERM GOAL #3   Title Pt core and LE strength to be increased by one grade to allow pt to be able to climb up and down steps in a reciprocal manner without difficulty    Status On-going               Plan - 08/16/17 0946    Clinical Impression Statement Pt with increased recall of tband decompression exercises.  Added seated piriformis stretch with good results and pateint requested instructions to complete this at home. Able to complete all other stretches and exercises without difficulty this session.  REmained  painfree at end of session .   Rehab Potential Good   PT Frequency 2x / week   PT Duration 4 weeks   PT Treatment/Interventions ADLs/Self Care Home Management;Therapeutic activities;Therapeutic exercise;Functional mobility training;Gait training;Patient/family education;Manual techniques   PT Next Visit Plan Re-evaluate next session.   PT Home Exercise Plan 07/27/2017:  scapular retraction, press heels into floor and sit up tall, decompression exercises 1-5    Consulted and Agree with Plan of Care Patient      Patient will benefit from skilled therapeutic intervention in order to improve the following deficits and impairments:  Abnormal gait, Decreased activity tolerance, Pain, Postural dysfunction, Decreased strength, Decreased range of motion, Impaired flexibility  Visit Diagnosis: Radiculopathy, lumbar region  Muscle weakness (generalized)  Stiffness of right hip, not elsewhere classified  Stiffness of left hip, not elsewhere classified     Problem List Patient Active Problem List   Diagnosis Date Noted  . Vulvar lesion 11/09/2016  . Vulvar atrophy 11/09/2016   . Cystocele 06/08/2014  . Cancer of central portion of left female breast ([place]) 07/21/2013  . Rectocele 06/05/2013  . Menopause 06/05/2013  . Vaginal atrophy 06/05/2013  . HTN (hypertension) 06/05/2013  . Other and unspecified hyperlipidemia 06/05/2013  .  SUI (stress urinary incontinence, female) 05/07/2012  . Female cystocele 05/07/2012    Teena Irani 08/16/2017, 10:17 AM  [facility] [address] [place], [place], [address] Phone: [phone]   Fax:  [phone]  Name: Cynthia Ferguson MRN: [id number] Date of Birth: [birth year]

## 2017-08-21 ENCOUNTER — Ambulatory Visit (HOSPITAL_COMMUNITY): Payer: BLUE CROSS/BLUE SHIELD | Admitting: Physical Therapy

## 2017-08-21 DIAGNOSIS — M6281 Muscle weakness (generalized): Secondary | ICD-10-CM

## 2017-08-21 DIAGNOSIS — M5416 Radiculopathy, lumbar region: Secondary | ICD-10-CM

## 2017-08-21 DIAGNOSIS — M25652 Stiffness of left hip, not elsewhere classified: Secondary | ICD-10-CM

## 2017-08-21 DIAGNOSIS — M25651 Stiffness of right hip, not elsewhere classified: Secondary | ICD-10-CM

## 2017-08-21 NOTE — Therapy (Addendum)
[place], [place], [address] Phone: [phone]   Fax:  [phone]  Physical Therapy Treatment/Discharge   Patient Details  Name: Cynthia Ferguson MRN: [id number] Date of Birth: [birth year] Referring Provider: Deri Fuelling   Encounter Date: 08/21/2017      PT End of Session - 08/21/17 0936    Visit Number 8   Number of Visits 8   Date for PT Re-Evaluation 08/26/17   Authorization Type BCBS   Authorization - Visit Number 8   Authorization - Number of Visits 8   PT Start Time 0903   PT Stop Time 0937   PT Time Calculation (min) 34 min   Activity Tolerance Patient tolerated treatment well   Behavior During Therapy [facility] for tasks assessed/performed      Past Medical History:  Diagnosis Date  . Anxiety   . Arthritis   . Bladder incontinence   . Cancer ([place]) 2014   left breast   . GERD (gastroesophageal reflux disease)   . Headache(784.0)    migraines  . High cholesterol   . Hypertension   . Personal history of radiation therapy 2014    Past Surgical History:  Procedure Laterality Date  . ABDOMINAL HYSTERECTOMY     COMPLETE   . BREAST EXCISIONAL BIOPSY Left    age 67...benign  . BREAST LUMPECTOMY Left 2014  . BREAST LUMPECTOMY WITH NEEDLE LOCALIZATION Left 07/11/2013   Procedure: BREAST LUMPECTOMY WITH NEEDLE LOCALIZATION;  Surgeon: Rolm Bookbinder, MD;  Location: [facility];  Service: General;  Laterality: Left;  needle localization at [facility] 11:00   . BREAST SURGERY     LEFT BREAST LUMPECTOMY - BENIGN  . BUNIONECTOMY  1993   LEFT FOOT  . BUNIONECTOMY Right NOVEMBER 2013   HAMMER TOE  . NECK SURGERY  2002   DISC REPLACED IN NECK  . right knee athroscopy     2006  . TUBAL LIGATION      There were no vitals filed for this visit.      Subjective Assessment - 08/21/17 0904    Subjective Pt states that she is doing her exercises about four times a week but she is also working full  time.     Pertinent History HTN Breast Cancer, cervical surgery, Rt knee scope    How long can you sit comfortably? Pt is able to sit about an hour now was 10 mintues    How long can you stand comfortably? no problem with this; was no problem    How long can you walk comfortably? Pt is not walking for exercise due to feet issues.    Currently in Pain? No/denies   Pain Location Leg  worst pain 6/10 but not often now.  Not like it was.    Pain Orientation Right;Left   Pain Descriptors / Indicators Cramping   Pain Type Chronic pain   Pain Onset More than a month ago   Pain Frequency Intermittent            [facility] PT Assessment - 08/21/17 0001      Assessment   Medical Diagnosis Lumbar spondylolisthesis   Referring Provider Deri Fuelling    Onset Date/Surgical Date 01/23/17   Next MD Visit 08/24/2017  Pt gets an injection in her back on Tuesday then sees MD    Prior Therapy none      Precautions   Precautions None     Restrictions   Weight Bearing  Restrictions No     Balance Screen   Has the patient fallen in the past 6 months No   Has the patient had a decrease in activity level because of a fear of falling?  Yes   Is the patient reluctant to leave their home because of a fear of falling?  No     Home [employer] residence     Prior Function   Level of Independence Independent   Vocation Full time employment   Vocation Requirements microscope, standing , sitting    Leisure none      Cognition   Overall Cognitive Status Within Functional Limits for tasks assessed     Observation/Other Assessments   Focus on Therapeutic Outcomes (FOTO)  50 was 37     Posture/Postural Control   Posture/Postural Control Postural limitations   Postural Limitations Rounded Shoulders;Forward head;Decreased lumbar lordosis;Increased thoracic kyphosis     AROM   Right Hip Flexion 120  was 110   Right Hip External Rotation  50  was 40   Right Hip Internal  Rotation  30  was 15    Left Hip Flexion 125  was 110   Left Hip External Rotation  55  was 45   Left Hip Internal Rotation  30  was 25    Lumbar Extension 30  was 15    Lumbar - Right Side Bend 20  was 15    Lumbar - Left Side Bend 20  was 15     Strength   Right Hip Flexion 5/5   Right Hip Extension 5/5  was 3/5   Right Hip ABduction 5/5  was 4+/5    Left Hip Flexion 5/5  was 3+/5    Left Hip Extension 5/5  was 3/5   Left Hip ABduction 5/5  was 3+/5    Right Knee Flexion 5/5  was 3+/5    Right Knee Extension 5/5   Left Knee Flexion 5/5  was 3+/5    Left Knee Extension 5/5   Right Ankle Dorsiflexion 5/5   Left Ankle Dorsiflexion 5/5     Flexibility   Soft Tissue Assessment /Muscle Length yes   Hamstrings Lt: 145 was   140:  RT 155 was 155     Ambulation/Gait   Ambulation/Gait Yes   Assistive device None   Gait Comments decreased stride Bilaterally, decreased knee extension, decreased ankle dorsiflexion.                              PT Education - 08/21/17 0932    Education provided Yes   Education Details hip hinging when sewing to protect the back, reviewed proper bed mobility, the importance of keeping up her stretches    Person(s) Educated Patient   Methods Explanation   Comprehension Verbalized understanding          PT Short Term Goals - 08/21/17 0933      PT SHORT TERM GOAL #1   Title Pt pain in her legs to be no greater than a 6/10 to allow pt to be able to complete an hour of light housework without difficulty    Time 2   Period Weeks   Status Achieved     PT SHORT TERM GOAL #2   Title PT back and hip ROM to be increased to allow pt to be able to don her shoes and socks with ease  Time 2   Period Weeks   Status Achieved     PT SHORT TERM GOAL #3   Title Pt core and LE strength to be increased by 1/2 grade to allow pt to come sit to stand and complete bed mobility with ease.    Time 2   Period Weeks   Status  Achieved           PT Long Term Goals - 08/21/17 0933      PT LONG TERM GOAL #1   Title Pt leg pain to be no greater than a 3/10 to allow pt to complete an hour and a half of housework including moderate activity such as vacuuming without increased pain    Time 4   Period Weeks   Status Achieved     PT LONG TERM GOAL #2   Title Pt back and hip ROM to be improved to allow pt to be able to pick items off the ground without difficulty    Time 4   Period Weeks   Status Achieved     PT LONG TERM GOAL #3   Title Pt core and LE strength to be increased by one grade to allow pt to be able to climb up and down steps in a reciprocal manner without difficulty    Status Pt is going one at a time but it is due to the knee she had surgery on not due to her back .               Plan - 08/21/17 0937    Clinical Impression Statement Pt reassessed with all goals met.  Pt has improved in her pain, ROM and strength.  Therapist and patient agree with discharge at this time.    Rehab Potential Good   PT Frequency 2x / week   PT Duration 4 weeks   PT Treatment/Interventions ADLs/Self Care Home Management;Therapeutic activities;Therapeutic exercise;Functional mobility training;Gait training;Patient/family education;Manual techniques   PT Next Visit Plan Discharge   PT Home Exercise Plan 07/27/2017:  scapular retraction, press heels into floor and sit up tall, decompression exercises 1-5    Consulted and Agree with Plan of Care Patient      Patient will benefit from skilled therapeutic intervention in order to improve the following deficits and impairments:  Abnormal gait, Decreased activity tolerance, Pain, Postural dysfunction, Decreased strength, Decreased range of motion, Impaired flexibility  Visit Diagnosis: Radiculopathy, lumbar region  Muscle weakness (generalized)  Stiffness of right hip, not elsewhere classified  Stiffness of left hip, not elsewhere classified     Problem  List Patient Active Problem List   Diagnosis Date Noted  . Vulvar lesion 11/09/2016  . Vulvar atrophy 11/09/2016  . Cystocele 06/08/2014  . Cancer of central portion of left female breast ([place]) 07/21/2013  . Rectocele 06/05/2013  . Menopause 06/05/2013  . Vaginal atrophy 06/05/2013  . HTN (hypertension) 06/05/2013  . Other and unspecified hyperlipidemia 06/05/2013  . SUI (stress urinary incontinence, female) 05/07/2012  . Female cystocele 05/07/2012    Rayetta Humphrey, PT CLT [phone] 08/21/2017, 9:38 AM  [facility], [place], [address] Phone: [phone]   Fax:  [phone]  Name: Cynthia Ferguson MRN: [id number] Date of Birth: Feb 11, 1956  PHYSICAL THERAPY DISCHARGE SUMMARY  Visits from Start of Care: 8 Current functional level related to goals / functional outcomes: See above    Remaining deficits: See above   Education / Equipment: See above  Plan: Patient agrees to discharge.  Patient goals were met. Patient is being discharged due to meeting the stated rehab goals.  ?????       Rayetta Humphrey, [place] CLT [phone]

## 2017-08-23 ENCOUNTER — Encounter (HOSPITAL_COMMUNITY): Payer: BLUE CROSS/BLUE SHIELD | Admitting: Physical Therapy

## 2017-08-28 ENCOUNTER — Encounter (HOSPITAL_COMMUNITY): Payer: BLUE CROSS/BLUE SHIELD | Admitting: Physical Therapy

## 2017-08-30 ENCOUNTER — Encounter (HOSPITAL_COMMUNITY): Payer: BLUE CROSS/BLUE SHIELD | Admitting: Physical Therapy

## 2017-10-03 ENCOUNTER — Other Ambulatory Visit: Payer: Self-pay | Admitting: Neurosurgery

## 2017-10-03 DIAGNOSIS — M4310 Spondylolisthesis, site unspecified: Secondary | ICD-10-CM

## 2017-10-08 ENCOUNTER — Ambulatory Visit
Admission: RE | Admit: 2017-10-08 | Discharge: 2017-10-08 | Disposition: A | Discharge status: Home / Self Care | Payer: BLUE CROSS/BLUE SHIELD | Source: Ambulatory Visit | Attending: Neurosurgery | Admitting: Neurosurgery

## 2017-10-08 DIAGNOSIS — M4310 Spondylolisthesis, site unspecified: Secondary | ICD-10-CM

## 2017-10-08 MED ORDER — METHYLPREDNISOLONE ACETATE 40 MG/ML INJ SUSP (RADIOLOG
120.0000 mg | Freq: Once | INTRAMUSCULAR | Status: AC
  Administered 2017-10-08: 120 mg via EPIDURAL

## 2017-10-08 MED ORDER — IOPAMIDOL (ISOVUE-M 200) INJECTION 41%
1.0000 mL | Freq: Once | INTRAMUSCULAR | Status: AC
  Administered 2017-10-08: 1 mL via EPIDURAL

## 2017-10-08 NOTE — Discharge Instructions (Signed)

## 2017-10-11 ENCOUNTER — Encounter: Payer: BLUE CROSS/BLUE SHIELD | Admitting: Obstetrics & Gynecology

## 2017-11-06 HISTORY — PX: BACK SURGERY: SHX140

## 2017-11-15 ENCOUNTER — Other Ambulatory Visit: Payer: Self-pay | Admitting: Obstetrics & Gynecology

## 2017-11-15 DIAGNOSIS — N6320 Unspecified lump in the left breast, unspecified quadrant: Secondary | ICD-10-CM

## 2017-11-29 ENCOUNTER — Encounter: Payer: Self-pay | Admitting: Obstetrics and Gynecology

## 2017-11-29 ENCOUNTER — Ambulatory Visit: Payer: BLUE CROSS/BLUE SHIELD | Admitting: Obstetrics and Gynecology

## 2017-11-29 ENCOUNTER — Encounter (INDEPENDENT_AMBULATORY_CARE_PROVIDER_SITE_OTHER): Payer: Self-pay

## 2017-11-29 VITALS — BP 140/70 | HR 97 | Ht 60.0 in | Wt 187.8 lb

## 2017-11-29 DIAGNOSIS — N393 Stress incontinence (female) (male): Secondary | ICD-10-CM

## 2017-11-29 NOTE — Progress Notes (Signed)
[facility] Visit  11/29/2017            Patient name: Cynthia Ferguson MRN [id number]  Date of birth: [birth year]  CC & HPI:  Cynthia Ferguson is a 62 y.o. female presenting today for  cystocele. She states she has been having trouble voiding. Associated symptoms of incontinence noted. No alleviating factors noted.She has seen a urologist who told her she is having problems emptying her bladder due to urethral placement. She has had a bladder sling placed previously. Urodynamics done as well at urologist. She states that when she is constipated, she states she can "feel something" across her rectum. She does not use miralax or stool softeners regularly.   She has been having breast pain where she had a biopsy done 6 months ago. She has had a total abdominal hysterectomy and BSO. She had breast cancer 4 years ago.  ROS:  ROS  +incontinence continuously with lifting or activity. +uterine cystocele by one provider, none by another(urology) + difficulty with bowel movements All systems are negative except as noted in the HPI and PMH.    Pertinent History Reviewed:   Reviewed: Significant for tubal ligation, abdominal hysterectomy, left breast cancer,   Medical         Past Medical History:  Diagnosis Date  . Anxiety   . Arthritis   . Bladder incontinence   . Cancer ([place]) 2014   left breast   . GERD (gastroesophageal reflux disease)   . Headache(784.0)    migraines  . High cholesterol   . Hypertension   . Personal history of radiation therapy 2014                              Surgical Hx:    Past Surgical History:  Procedure Laterality Date  . ABDOMINAL HYSTERECTOMY     COMPLETE   . BREAST EXCISIONAL BIOPSY Left    age 50...benign  . BREAST LUMPECTOMY Left 2014  . BREAST LUMPECTOMY WITH NEEDLE LOCALIZATION Left 07/11/2013   Procedure: BREAST LUMPECTOMY WITH NEEDLE LOCALIZATION;  Surgeon: Rolm Bookbinder, MD;  Location: [facility];  Service: General;   Laterality: Left;  needle localization at [facility] 11:00   . BREAST SURGERY     LEFT BREAST LUMPECTOMY - BENIGN  . BUNIONECTOMY  1993   LEFT FOOT  . BUNIONECTOMY Right NOVEMBER 2013   HAMMER TOE  . NECK SURGERY  2002   DISC REPLACED IN NECK  . right knee athroscopy     2006  . TUBAL LIGATION     Medications: Reviewed & Updated - see associated section                       Current Outpatient Medications:  .  ALPRAZolam (XANAX) 0.5 MG tablet, Take 0.5 mg by mouth 2 (two) times daily. As needed for sleep and anxiety, Disp: , Rfl:  .  aspirin-acetaminophen-caffeine (EXCEDRIN MIGRAINE) 250-250-65 MG per tablet, Take 2 tablets by mouth as needed., Disp: , Rfl:  .  calcium-vitamin D (OSCAL) 250-125 MG-UNIT per tablet, Take 2 tablets by mouth 2 (two) times daily. , Disp: , Rfl:  .  cetirizine (ZYRTEC) 10 MG tablet, Take 10 mg by mouth daily., Disp: , Rfl:  .  hydrochlorothiazide (HYDRODIURIL) 25 MG tablet, Take 25 mg by mouth daily., Disp: , Rfl:  .  losartan (COZAAR) 100 MG tablet, Take 100 mg  by mouth daily., Disp: , Rfl:  .  multivitamin-lutein (OCUVITE-LUTEIN) CAPS capsule, Take 1 capsule by mouth daily., Disp: , Rfl:  .  omeprazole (PRILOSEC) 20 MG capsule, Take 20 mg by mouth daily., Disp: , Rfl:  .  acetaminophen (TYLENOL) 325 MG tablet, Take 650 mg by mouth every 6 (six) hours as needed for pain., Disp: , Rfl:  .  Calcium Carbonate-Vitamin D (CALCIUM 600-D) 600-400 MG-UNIT per tablet, Take 1 tablet by mouth., Disp: , Rfl:  .  polyethylene glycol (MIRALAX / GLYCOLAX) packet, Take 17 g by mouth as needed., Disp: , Rfl:    Social History: Reviewed -  reports that  has never smoked. she has never used smokeless tobacco.  Objective Findings:  Vitals: Blood pressure 140/70, pulse 97, height 5' (1.524 m), weight 187 lb 12.8 oz (85.2 kg).  Physical Examination: General appearance - alert, well appearing, and in no distress Mental status - alert, oriented to person, place, and time Pelvic -   VULVA: normal appearing vulva with no masses, tenderness or lesions,  VAGINA: normal appearing vagina with normal color and discharge, no lesions, total loss of urine control with Ruthann Cancer test, cough leads to minimal rotation and descent of anterior wall but pt with IMMEDIATE urine loss. CERVIX: surgically absent,  UTERUS: surgically absent, vaginal cuff well healed,  ADNEXA: surgically absent bilateral Rectal exam: negative without mass, lesions or tenderness.  Discussion: 1. Discussed with pt what occurred during her hysterectomy that might be causing her problems today. Diagrams explained to pt. Pt stated her previous doctor told her she was not a good candidate for surgery.  At end of discussion, pt had opportunity to ask questions and has no further questions at this time.   Specific discussion  as noted above. Greater than 50% was spent in counseling and coordination of care with the patient.   Total time greater than: 15 minutes.    Assessment & Plan:   A:  1. Stress incontinence, with immobile urethera 2. Discussion of surgical management limits, pt to see dr McDairmid prn.  P:  1. Advised pt to try Poise as trial 1a; pt to continue qhs macrodantin. 2. F/u in 2 months to discuss options   By signing my name below, I, Izna Ahmed, attest that this documentation has been prepared under the direction and in the presence of Jonnie Kind, MD. Electronically Signed: Jabier Gauss, Medical Scribe. 11/29/17. 9:29 AM.  I personally performed the services described in this documentation, which was SCRIBED in my presence. The recorded information has been reviewed and considered accurate. It has been edited as necessary during review. Jonnie Kind, MD

## 2017-12-11 ENCOUNTER — Other Ambulatory Visit: Payer: Self-pay | Admitting: Neurosurgery

## 2017-12-11 DIAGNOSIS — M4310 Spondylolisthesis, site unspecified: Secondary | ICD-10-CM

## 2017-12-13 ENCOUNTER — Ambulatory Visit
Admission: RE | Admit: 2017-12-13 | Discharge: 2017-12-13 | Disposition: A | Discharge status: Home / Self Care | Payer: BLUE CROSS/BLUE SHIELD | Source: Ambulatory Visit | Attending: Obstetrics & Gynecology | Admitting: Obstetrics & Gynecology

## 2017-12-13 DIAGNOSIS — N6320 Unspecified lump in the left breast, unspecified quadrant: Secondary | ICD-10-CM

## 2017-12-20 ENCOUNTER — Ambulatory Visit
Admission: RE | Admit: 2017-12-20 | Discharge: 2017-12-20 | Disposition: A | Discharge status: Home / Self Care | Payer: BLUE CROSS/BLUE SHIELD | Source: Ambulatory Visit | Attending: Neurosurgery | Admitting: Neurosurgery

## 2017-12-20 DIAGNOSIS — M4310 Spondylolisthesis, site unspecified: Secondary | ICD-10-CM

## 2017-12-20 MED ORDER — METHYLPREDNISOLONE ACETATE 40 MG/ML INJ SUSP (RADIOLOG
120.0000 mg | Freq: Once | INTRAMUSCULAR | Status: AC
  Administered 2017-12-20: 120 mg via EPIDURAL

## 2017-12-20 MED ORDER — IOPAMIDOL (ISOVUE-M 200) INJECTION 41%
1.0000 mL | Freq: Once | INTRAMUSCULAR | Status: AC
  Administered 2017-12-20: 1 mL via EPIDURAL

## 2017-12-20 NOTE — Discharge Instructions (Signed)

## 2018-01-09 ENCOUNTER — Other Ambulatory Visit: Payer: Self-pay | Admitting: Neurosurgery

## 2018-01-21 NOTE — Pre-Procedure Instructions (Signed)
Cynthia Ferguson  01/21/2018       [facility] [place] [place] - [address] [place] [address] Phone: [phone] Fax: [phone]  [facility] [place] [place] [facility] [place] [address] [place] [address] Phone: [phone] Fax: [phone]    Your procedure is scheduled on Mon. March 25  Report to [facility] Admitting at 8:20 A.M.  Call this number if you have problems the morning of surgery:  [phone]   Remember:  Do not eat food or drink liquids after midnight on Sum. March 24   Take these medicines the morning of surgery with A SIP OF WATER : tylenol if needed,alprazolam (xanax) if needed,diphenhydramine (benadryl), omeprazole (prilosec), eye drops if needed   7 days prior to surgery STOP taking any Aspirin(unless otherwise instructed by your surgeon), Aleve, Naproxen, Ibuprofen, Motrin, Advil, Goody's, BC's, all herbal medications, fish oil, and all vitamins, diclofenac (voltaren)   Do not wear jewelry, make-up or nail polish.  Do not wear lotions, powders, or perfumes, or deodorant.  Do not shave 48 hours prior to surgery.  Men may shave face and neck.  Do not bring valuables to the hospital.  [facility] is not responsible for any belongings or valuables.  Contacts, dentures or bridgework may not be worn into surgery.  Leave your suitcase in the car.  After surgery it may be brought to your room.  For patients admitted to the hospital, discharge time will be determined by your treatment team.  Patients discharged the day of surgery will not be allowed to drive home.    Special instructions:  [facility]- Preparing For Surgery  Before surgery, you can play an important role. Because skin is not sterile, your skin needs to be as free of germs as possible. You can reduce the number of germs on your skin by washing with CHG (chlorahexidine gluconate) Soap before surgery.  CHG is an  antiseptic cleaner which kills germs and bonds with the skin to continue killing germs even after washing.  Please do not use if you have an allergy to CHG or antibacterial soaps. If your skin becomes reddened/irritated stop using the CHG.  Do not shave (including legs and underarms) for at least 48 hours prior to first CHG shower. It is OK to shave your face.  Please follow these instructions carefully.   1. Shower the NIGHT BEFORE SURGERY and the MORNING OF SURGERY with CHG.   2. If you chose to wash your hair, wash your hair first as usual with your normal shampoo.  3. After you shampoo, rinse your hair and body thoroughly to remove the shampoo.  4. Use CHG as you would any other liquid soap. You can apply CHG directly to the skin and wash gently with a scrungie or a clean washcloth.   5. Apply the CHG Soap to your body ONLY FROM THE NECK DOWN.  Do not use on open wounds or open sores. Avoid contact with your eyes, ears, mouth and genitals (private parts). Wash Face and genitals (private parts)  with your normal soap.  6. Wash thoroughly, paying special attention to the area where your surgery will be performed.  7. Thoroughly rinse your body with warm water from the neck down.  8. DO NOT shower/wash with your normal soap after using and rinsing off the CHG Soap.  9. Pat yourself dry with a CLEAN TOWEL.  10. Wear CLEAN PAJAMAS to bed the night before surgery, wear comfortable clothes the morning of surgery  11. Place CLEAN SHEETS on your bed the night of your first shower and DO NOT SLEEP WITH PETS.    Day of Surgery: Do not apply any deodorants/lotions. Please wear clean clothes to the hospital/surgery center.      Please read over the following fact sheets that you were given. Coughing and Deep Breathing and Surgical Site Infection Prevention

## 2018-01-22 ENCOUNTER — Other Ambulatory Visit: Payer: Self-pay

## 2018-01-22 ENCOUNTER — Encounter (HOSPITAL_COMMUNITY)
Admission: RE | Admit: 2018-01-22 | Discharge: 2018-01-22 | Disposition: A | Discharge status: Home / Self Care | Payer: BLUE CROSS/BLUE SHIELD | Source: Ambulatory Visit | Attending: Neurosurgery | Admitting: Neurosurgery

## 2018-01-22 ENCOUNTER — Encounter (HOSPITAL_COMMUNITY): Payer: Self-pay

## 2018-01-22 DIAGNOSIS — Z01810 Encounter for preprocedural cardiovascular examination: Secondary | ICD-10-CM | POA: Diagnosis present

## 2018-01-22 DIAGNOSIS — Z01812 Encounter for preprocedural laboratory examination: Secondary | ICD-10-CM | POA: Diagnosis not present

## 2018-01-22 DIAGNOSIS — I10 Essential (primary) hypertension: Secondary | ICD-10-CM | POA: Insufficient documentation

## 2018-01-22 HISTORY — DX: Other complications of anesthesia, initial encounter: T88.59XA

## 2018-01-22 HISTORY — DX: Adverse effect of unspecified anesthetic, initial encounter: T41.45XA

## 2018-01-22 LAB — CBC WITH DIFFERENTIAL/PLATELET
Basophils Absolute: 0 10*3/uL (ref 0.0–0.1)
Basophils Relative: 0 %
EOS ABS: 0 10*3/uL (ref 0.0–0.7)
EOS PCT: 0 %
HCT: 35.3 % — ABNORMAL LOW (ref 36.0–46.0)
Hemoglobin: 12.5 g/dL (ref 12.0–15.0)
LYMPHS ABS: 1.9 10*3/uL (ref 0.7–4.0)
Lymphocytes Relative: 32 %
MCH: 32.5 pg (ref 26.0–34.0)
MCHC: 35.4 g/dL (ref 30.0–36.0)
MCV: 91.7 fL (ref 78.0–100.0)
MONO ABS: 0.4 10*3/uL (ref 0.1–1.0)
Monocytes Relative: 7 %
Neutro Abs: 3.6 10*3/uL (ref 1.7–7.7)
Neutrophils Relative %: 61 %
PLATELETS: 297 10*3/uL (ref 150–400)
RBC: 3.85 MIL/uL — AB (ref 3.87–5.11)
RDW: 12.4 % (ref 11.5–15.5)
WBC: 5.8 10*3/uL (ref 4.0–10.5)

## 2018-01-22 LAB — TYPE AND SCREEN
ABO/RH(D): A POS
Antibody Screen: NEGATIVE

## 2018-01-22 LAB — BASIC METABOLIC PANEL
Anion gap: 10 (ref 5–15)
BUN: 9 mg/dL (ref 6–20)
CHLORIDE: 97 mmol/L — AB (ref 101–111)
CO2: 27 mmol/L (ref 22–32)
Calcium: 9.6 mg/dL (ref 8.9–10.3)
Creatinine, Ser: 0.93 mg/dL (ref 0.44–1.00)
GFR calc Af Amer: >60 mL/min (ref >60)
GFR calc non Af Amer: >60 mL/min (ref >60)
GLUCOSE: 126 mg/dL — AB (ref 65–99)
POTASSIUM: 3.2 mmol/L — AB (ref 3.5–5.1)
Sodium: 134 mmol/L — ABNORMAL LOW (ref 135–145)

## 2018-01-22 LAB — ABO/RH: ABO/RH(D): A POS

## 2018-01-22 LAB — SURGICAL PCR SCREEN
MRSA, PCR: NEGATIVE
STAPHYLOCOCCUS AUREUS: NEGATIVE

## 2018-01-22 NOTE — Progress Notes (Signed)
PCP: Dr. Talbert Forest Corrington Cardiologist: Denies  EKG: Today ECHO: denies Stress Test: denies Cardiac Cath: denies  Pt reports she is finishing up antibiotics for a sinus infection. Instructed pt to call surgeon's office if sinus infection not resolved, verbalized understanding.   Patient denies shortness of breath, fever, cough, and chest pain at PAT appointment.  Patient verbalized understanding of instructions provided today at the PAT appointment.  Patient asked to review instructions at home and day of surgery.

## 2018-01-27 MED ORDER — VANCOMYCIN HCL 10 G IV SOLR
1250.0000 mg | INTRAVENOUS | Status: AC
  Administered 2018-01-28: 1250 mg via INTRAVENOUS
  Filled 2018-01-27: qty 1250

## 2018-01-28 ENCOUNTER — Inpatient Hospital Stay (HOSPITAL_COMMUNITY): Payer: BLUE CROSS/BLUE SHIELD | Admitting: Anesthesiology

## 2018-01-28 ENCOUNTER — Encounter (HOSPITAL_COMMUNITY): Payer: Self-pay | Admitting: *Deleted

## 2018-01-28 ENCOUNTER — Inpatient Hospital Stay (HOSPITAL_COMMUNITY): Payer: BLUE CROSS/BLUE SHIELD

## 2018-01-28 ENCOUNTER — Other Ambulatory Visit: Payer: BLUE CROSS/BLUE SHIELD | Admitting: Obstetrics and Gynecology

## 2018-01-28 ENCOUNTER — Inpatient Hospital Stay (HOSPITAL_COMMUNITY)
Admission: RE | Admit: 2018-01-28 | Discharge: 2018-01-29 | DRG: 460 | Disposition: A | Discharge status: Home Health | Payer: BLUE CROSS/BLUE SHIELD | Source: Ambulatory Visit | Attending: Neurosurgery | Admitting: Neurosurgery

## 2018-01-28 ENCOUNTER — Other Ambulatory Visit: Payer: Self-pay

## 2018-01-28 ENCOUNTER — Inpatient Hospital Stay (HOSPITAL_COMMUNITY)
Admission: RE | Disposition: A | Discharge status: Home Health | Payer: Self-pay | Source: Ambulatory Visit | Attending: Neurosurgery

## 2018-01-28 DIAGNOSIS — Z800 Family history of malignant neoplasm of digestive organs: Secondary | ICD-10-CM | POA: Diagnosis not present

## 2018-01-28 DIAGNOSIS — Z90710 Acquired absence of both cervix and uterus: Secondary | ICD-10-CM | POA: Diagnosis not present

## 2018-01-28 DIAGNOSIS — Z881 Allergy status to other antibiotic agents status: Secondary | ICD-10-CM | POA: Diagnosis not present

## 2018-01-28 DIAGNOSIS — I10 Essential (primary) hypertension: Secondary | ICD-10-CM | POA: Diagnosis present

## 2018-01-28 DIAGNOSIS — Z880 Allergy status to penicillin: Secondary | ICD-10-CM

## 2018-01-28 DIAGNOSIS — Z853 Personal history of malignant neoplasm of breast: Secondary | ICD-10-CM

## 2018-01-28 DIAGNOSIS — M48062 Spinal stenosis, lumbar region with neurogenic claudication: Secondary | ICD-10-CM | POA: Diagnosis present

## 2018-01-28 DIAGNOSIS — Z87892 Personal history of anaphylaxis: Secondary | ICD-10-CM

## 2018-01-28 DIAGNOSIS — Z8249 Family history of ischemic heart disease and other diseases of the circulatory system: Secondary | ICD-10-CM

## 2018-01-28 DIAGNOSIS — M5127 Other intervertebral disc displacement, lumbosacral region: Secondary | ICD-10-CM | POA: Diagnosis present

## 2018-01-28 DIAGNOSIS — Z801 Family history of malignant neoplasm of trachea, bronchus and lung: Secondary | ICD-10-CM | POA: Diagnosis not present

## 2018-01-28 DIAGNOSIS — F419 Anxiety disorder, unspecified: Secondary | ICD-10-CM | POA: Diagnosis present

## 2018-01-28 DIAGNOSIS — Z803 Family history of malignant neoplasm of breast: Secondary | ICD-10-CM

## 2018-01-28 DIAGNOSIS — Z7982 Long term (current) use of aspirin: Secondary | ICD-10-CM

## 2018-01-28 DIAGNOSIS — K219 Gastro-esophageal reflux disease without esophagitis: Secondary | ICD-10-CM | POA: Diagnosis present

## 2018-01-28 DIAGNOSIS — M4310 Spondylolisthesis, site unspecified: Secondary | ICD-10-CM | POA: Diagnosis present

## 2018-01-28 DIAGNOSIS — Z888 Allergy status to other drugs, medicaments and biological substances status: Secondary | ICD-10-CM

## 2018-01-28 DIAGNOSIS — Z923 Personal history of irradiation: Secondary | ICD-10-CM | POA: Diagnosis not present

## 2018-01-28 DIAGNOSIS — M4316 Spondylolisthesis, lumbar region: Secondary | ICD-10-CM | POA: Diagnosis present

## 2018-01-28 DIAGNOSIS — M48061 Spinal stenosis, lumbar region without neurogenic claudication: Secondary | ICD-10-CM | POA: Diagnosis present

## 2018-01-28 DIAGNOSIS — M5137 Other intervertebral disc degeneration, lumbosacral region: Secondary | ICD-10-CM | POA: Diagnosis present

## 2018-01-28 DIAGNOSIS — Z419 Encounter for procedure for purposes other than remedying health state, unspecified: Secondary | ICD-10-CM

## 2018-01-28 SURGERY — POSTERIOR LUMBAR FUSION 2 LEVEL
Anesthesia: General | Site: Back

## 2018-01-28 MED ORDER — CALCIUM CARBONATE-VITAMIN D 500-200 MG-UNIT PO TABS
2.0000 | ORAL_TABLET | Freq: Two times a day (BID) | ORAL | Status: DC
  Administered 2018-01-28 – 2018-01-29 (×2): 2 via ORAL
  Filled 2018-01-28 (×2): qty 2

## 2018-01-28 MED ORDER — DEXAMETHASONE SODIUM PHOSPHATE 10 MG/ML IJ SOLN
10.0000 mg | INTRAMUSCULAR | Status: AC
  Administered 2018-01-28: 10 mg via INTRAVENOUS

## 2018-01-28 MED ORDER — ADULT MULTIVITAMIN W/MINERALS CH: 1.0000 | ORAL_TABLET | Freq: Every day | ORAL | Status: DC

## 2018-01-28 MED ORDER — ROCURONIUM BROMIDE 10 MG/ML (PF) SYRINGE
PREFILLED_SYRINGE | INTRAVENOUS | Status: AC
  Filled 2018-01-28: qty 5

## 2018-01-28 MED ORDER — ONDANSETRON HCL 4 MG/2ML IJ SOLN
4.0000 mg | Freq: Four times a day (QID) | INTRAMUSCULAR | Status: DC | PRN
  Administered 2018-01-28: 4 mg via INTRAVENOUS
  Filled 2018-01-28: qty 2

## 2018-01-28 MED ORDER — LIDOCAINE HCL (CARDIAC) 20 MG/ML IV SOLN
INTRAVENOUS | Status: AC
  Filled 2018-01-28: qty 5

## 2018-01-28 MED ORDER — ONE-A-DAY WOMENS FORMULA PO TABS: ORAL_TABLET | Freq: Every day | ORAL | Status: DC

## 2018-01-28 MED ORDER — POLYETHYLENE GLYCOL 3350 17 G PO PACK: 17.0000 g | PACK | Freq: Every day | ORAL | Status: DC | PRN

## 2018-01-28 MED ORDER — ALPRAZOLAM 0.25 MG PO TABS
0.2500 mg | ORAL_TABLET | Freq: Two times a day (BID) | ORAL | Status: DC | PRN
  Administered 2018-01-29: 0.25 mg via ORAL
  Filled 2018-01-28: qty 1

## 2018-01-28 MED ORDER — SUFENTANIL CITRATE 50 MCG/ML IV SOLN
INTRAVENOUS | Status: AC
  Filled 2018-01-28: qty 1

## 2018-01-28 MED ORDER — PROPYLENE GLYCOL 0.6 % OP SOLN: 1.0000 [drp] | Freq: Three times a day (TID) | OPHTHALMIC | Status: DC | PRN

## 2018-01-28 MED ORDER — DICLOFENAC SODIUM 50 MG PO TBEC
50.0000 mg | DELAYED_RELEASE_TABLET | Freq: Two times a day (BID) | ORAL | Status: DC
  Administered 2018-01-28 – 2018-01-29 (×2): 50 mg via ORAL
  Filled 2018-01-28 (×2): qty 1

## 2018-01-28 MED ORDER — PROPOFOL 10 MG/ML IV BOLUS
INTRAVENOUS | Status: AC
  Filled 2018-01-28: qty 20

## 2018-01-28 MED ORDER — HYDROMORPHONE HCL 1 MG/ML IJ SOLN
INTRAMUSCULAR | Status: AC
  Administered 2018-01-28: 0.5 mg via INTRAVENOUS
  Filled 2018-01-28: qty 1

## 2018-01-28 MED ORDER — FLEET ENEMA 7-19 GM/118ML RE ENEM: 1.0000 | ENEMA | Freq: Once | RECTAL | Status: DC | PRN

## 2018-01-28 MED ORDER — MENTHOL 3 MG MT LOZG: 1.0000 | LOZENGE | OROMUCOSAL | Status: DC | PRN

## 2018-01-28 MED ORDER — PANTOPRAZOLE SODIUM 40 MG PO TBEC
40.0000 mg | DELAYED_RELEASE_TABLET | Freq: Every day | ORAL | Status: DC
  Administered 2018-01-29: 40 mg via ORAL
  Filled 2018-01-28: qty 1

## 2018-01-28 MED ORDER — THROMBIN 5000 UNITS EX SOLR
CUTANEOUS | Status: AC
Start: 2018-01-28 — End: 2018-01-28
  Filled 2018-01-28: qty 5000

## 2018-01-28 MED ORDER — FENTANYL CITRATE (PF) 100 MCG/2ML IJ SOLN
INTRAMUSCULAR | Status: DC | PRN
  Administered 2018-01-28: 50 ug via INTRAVENOUS

## 2018-01-28 MED ORDER — BACITRACIN 50000 UNITS IM SOLR
INTRAMUSCULAR | Status: DC | PRN
  Administered 2018-01-28: 500 mL

## 2018-01-28 MED ORDER — PHENYLEPHRINE 40 MCG/ML (10ML) SYRINGE FOR IV PUSH (FOR BLOOD PRESSURE SUPPORT)
PREFILLED_SYRINGE | INTRAVENOUS | Status: AC
  Filled 2018-01-28: qty 10

## 2018-01-28 MED ORDER — POLYVINYL ALCOHOL 1.4 % OP SOLN
1.0000 [drp] | Freq: Three times a day (TID) | OPHTHALMIC | Status: DC | PRN
  Filled 2018-01-28: qty 15

## 2018-01-28 MED ORDER — HYDROCODONE-ACETAMINOPHEN 10-325 MG PO TABS
1.0000 | ORAL_TABLET | ORAL | Status: DC | PRN
  Administered 2018-01-29: 1 via ORAL
  Filled 2018-01-28: qty 1

## 2018-01-28 MED ORDER — SODIUM CHLORIDE 0.9 % IV SOLN: 250.0000 mL | INTRAVENOUS | Status: DC

## 2018-01-28 MED ORDER — THROMBIN (RECOMBINANT) 20000 UNITS EX SOLR
CUTANEOUS | Status: DC | PRN
  Administered 2018-01-28: 20 mL via TOPICAL

## 2018-01-28 MED ORDER — SUFENTANIL CITRATE 50 MCG/ML IV SOLN
INTRAVENOUS | Status: DC | PRN
  Administered 2018-01-28: 15 ug via INTRAVENOUS
  Administered 2018-01-28: 10 ug via INTRAVENOUS
  Administered 2018-01-28: 5 ug via INTRAVENOUS
  Administered 2018-01-28: 10 ug via INTRAVENOUS

## 2018-01-28 MED ORDER — MIDAZOLAM HCL 2 MG/2ML IJ SOLN
INTRAMUSCULAR | Status: AC
  Filled 2018-01-28: qty 2

## 2018-01-28 MED ORDER — HYDROMORPHONE HCL 1 MG/ML IJ SOLN: 1.0000 mg | INTRAMUSCULAR | Status: DC | PRN

## 2018-01-28 MED ORDER — CHLORHEXIDINE GLUCONATE CLOTH 2 % EX PADS: 6.0000 | MEDICATED_PAD | Freq: Once | CUTANEOUS | Status: DC

## 2018-01-28 MED ORDER — DIAZEPAM 5 MG PO TABS
5.0000 mg | ORAL_TABLET | Freq: Four times a day (QID) | ORAL | Status: DC | PRN
  Administered 2018-01-28 – 2018-01-29 (×2): 5 mg via ORAL
  Filled 2018-01-28 (×2): qty 1

## 2018-01-28 MED ORDER — ONDANSETRON HCL 4 MG PO TABS: 4.0000 mg | ORAL_TABLET | Freq: Four times a day (QID) | ORAL | Status: DC | PRN

## 2018-01-28 MED ORDER — SODIUM CHLORIDE 0.9% FLUSH: 3.0000 mL | Freq: Two times a day (BID) | INTRAVENOUS | Status: DC

## 2018-01-28 MED ORDER — FENTANYL CITRATE (PF) 250 MCG/5ML IJ SOLN
INTRAMUSCULAR | Status: AC
  Filled 2018-01-28: qty 5

## 2018-01-28 MED ORDER — KETOROLAC TROMETHAMINE 30 MG/ML IJ SOLN
30.0000 mg | Freq: Once | INTRAMUSCULAR | Status: DC | PRN
  Administered 2018-01-28: 30 mg via INTRAVENOUS

## 2018-01-28 MED ORDER — PHENOL 1.4 % MT LIQD
1.0000 | OROMUCOSAL | Status: DC | PRN
  Administered 2018-01-28: 1 via OROMUCOSAL
  Filled 2018-01-28: qty 177

## 2018-01-28 MED ORDER — ALPRAZOLAM 0.5 MG PO TABS: 0.5000 mg | ORAL_TABLET | Freq: Every evening | ORAL | Status: DC | PRN

## 2018-01-28 MED ORDER — LIDOCAINE HCL (CARDIAC) 20 MG/ML IV SOLN
INTRAVENOUS | Status: DC | PRN
  Administered 2018-01-28: 100 mg via INTRAVENOUS

## 2018-01-28 MED ORDER — KETAMINE HCL 10 MG/ML IJ SOLN
INTRAMUSCULAR | Status: DC | PRN
  Administered 2018-01-28 (×3): 10 mg via INTRAVENOUS

## 2018-01-28 MED ORDER — PROPOFOL 10 MG/ML IV BOLUS
INTRAVENOUS | Status: DC | PRN
  Administered 2018-01-28: 150 mg via INTRAVENOUS

## 2018-01-28 MED ORDER — 0.9 % SODIUM CHLORIDE (POUR BTL) OPTIME
TOPICAL | Status: DC | PRN
  Administered 2018-01-28: 1000 mL

## 2018-01-28 MED ORDER — SODIUM CHLORIDE 0.9% FLUSH: 3.0000 mL | INTRAVENOUS | Status: DC | PRN

## 2018-01-28 MED ORDER — SODIUM CHLORIDE 0.9 % IJ SOLN
INTRAMUSCULAR | Status: AC
  Filled 2018-01-28: qty 10

## 2018-01-28 MED ORDER — OXYCODONE HCL 5 MG PO TABS
10.0000 mg | ORAL_TABLET | ORAL | Status: DC | PRN
  Administered 2018-01-28 – 2018-01-29 (×4): 10 mg via ORAL
  Filled 2018-01-28 (×4): qty 2

## 2018-01-28 MED ORDER — VANCOMYCIN HCL 10 G IV SOLR
1250.0000 mg | Freq: Once | INTRAVENOUS | Status: AC
  Administered 2018-01-28: 1250 mg via INTRAVENOUS
  Filled 2018-01-28 (×2): qty 1250

## 2018-01-28 MED ORDER — VANCOMYCIN HCL 1 G IV SOLR
INTRAVENOUS | Status: DC | PRN
  Administered 2018-01-28: 1000 mg

## 2018-01-28 MED ORDER — ACETAMINOPHEN 325 MG PO TABS: 650.0000 mg | ORAL_TABLET | ORAL | Status: DC | PRN

## 2018-01-28 MED ORDER — NITROFURANTOIN MACROCRYSTAL 100 MG PO CAPS
100.0000 mg | ORAL_CAPSULE | Freq: Every day | ORAL | Status: DC
  Administered 2018-01-28 – 2018-01-29 (×2): 100 mg via ORAL
  Filled 2018-01-28 (×3): qty 1

## 2018-01-28 MED ORDER — BUPIVACAINE HCL (PF) 0.25 % IJ SOLN
INTRAMUSCULAR | Status: DC | PRN
  Administered 2018-01-28: 30 mL

## 2018-01-28 MED ORDER — BUPIVACAINE HCL (PF) 0.25 % IJ SOLN
INTRAMUSCULAR | Status: AC
  Filled 2018-01-28: qty 30

## 2018-01-28 MED ORDER — MIDAZOLAM HCL 5 MG/5ML IJ SOLN
INTRAMUSCULAR | Status: DC | PRN
  Administered 2018-01-28: 1 mg via INTRAVENOUS

## 2018-01-28 MED ORDER — SUGAMMADEX SODIUM 200 MG/2ML IV SOLN
INTRAVENOUS | Status: AC
  Filled 2018-01-28: qty 2

## 2018-01-28 MED ORDER — RISAQUAD PO CAPS
1.0000 | ORAL_CAPSULE | Freq: Every day | ORAL | Status: DC
  Administered 2018-01-29: 1 via ORAL
  Filled 2018-01-28 (×2): qty 1

## 2018-01-28 MED ORDER — KETOROLAC TROMETHAMINE 30 MG/ML IJ SOLN
INTRAMUSCULAR | Status: AC
  Filled 2018-01-28: qty 1

## 2018-01-28 MED ORDER — LACTATED RINGERS IV SOLN
INTRAVENOUS | Status: DC
  Administered 2018-01-28 (×3): via INTRAVENOUS

## 2018-01-28 MED ORDER — PROBIOTIC DAILY PO CAPS: ORAL_CAPSULE | Freq: Every day | ORAL | Status: DC

## 2018-01-28 MED ORDER — DIPHENHYDRAMINE HCL 25 MG PO CAPS: 25.0000 mg | ORAL_CAPSULE | Freq: Every day | ORAL | Status: DC

## 2018-01-28 MED ORDER — SUGAMMADEX SODIUM 500 MG/5ML IV SOLN
INTRAVENOUS | Status: DC | PRN
  Administered 2018-01-28: 150 mg via INTRAVENOUS

## 2018-01-28 MED ORDER — HYDROMORPHONE HCL 1 MG/ML IJ SOLN
0.2500 mg | INTRAMUSCULAR | Status: DC | PRN
  Administered 2018-01-28 (×2): 0.5 mg via INTRAVENOUS

## 2018-01-28 MED ORDER — ONDANSETRON HCL 4 MG/2ML IJ SOLN
INTRAMUSCULAR | Status: DC | PRN
  Administered 2018-01-28: 4 mg via INTRAVENOUS

## 2018-01-28 MED ORDER — PHENYLEPHRINE HCL 10 MG/ML IJ SOLN
INTRAVENOUS | Status: DC | PRN
  Administered 2018-01-28: 20 ug/min via INTRAVENOUS

## 2018-01-28 MED ORDER — HYDROCHLOROTHIAZIDE 25 MG PO TABS: 25.0000 mg | ORAL_TABLET | Freq: Every day | ORAL | Status: DC

## 2018-01-28 MED ORDER — ROCURONIUM BROMIDE 100 MG/10ML IV SOLN
INTRAVENOUS | Status: DC | PRN
  Administered 2018-01-28: 40 mg via INTRAVENOUS
  Administered 2018-01-28: 20 mg via INTRAVENOUS

## 2018-01-28 MED ORDER — PROMETHAZINE HCL 25 MG/ML IJ SOLN: 6.2500 mg | INTRAMUSCULAR | Status: DC | PRN

## 2018-01-28 MED ORDER — OCUVITE-LUTEIN PO CAPS: 1.0000 | ORAL_CAPSULE | Freq: Every day | ORAL | Status: DC

## 2018-01-28 MED ORDER — ACETAMINOPHEN 650 MG RE SUPP: 650.0000 mg | RECTAL | Status: DC | PRN

## 2018-01-28 MED ORDER — MEPERIDINE HCL 50 MG/ML IJ SOLN: 6.2500 mg | INTRAMUSCULAR | Status: DC | PRN

## 2018-01-28 MED ORDER — LOSARTAN POTASSIUM 50 MG PO TABS: 100.0000 mg | ORAL_TABLET | Freq: Every day | ORAL | Status: DC

## 2018-01-28 MED ORDER — KETAMINE HCL 10 MG/ML IJ SOLN
INTRAMUSCULAR | Status: AC
  Filled 2018-01-28: qty 1

## 2018-01-28 MED ORDER — VANCOMYCIN HCL 1000 MG IV SOLR
INTRAVENOUS | Status: AC
  Filled 2018-01-28: qty 1000

## 2018-01-28 MED ORDER — ALPRAZOLAM 0.25 MG PO TABS: 0.2500 mg | ORAL_TABLET | Freq: Two times a day (BID) | ORAL | Status: DC

## 2018-01-28 MED ORDER — BISACODYL 10 MG RE SUPP: 10.0000 mg | Freq: Every day | RECTAL | Status: DC | PRN

## 2018-01-28 MED ORDER — ONDANSETRON HCL 4 MG/2ML IJ SOLN
INTRAMUSCULAR | Status: AC
Start: 2018-01-28 — End: 2018-01-28
  Filled 2018-01-28: qty 2

## 2018-01-28 MED ORDER — LORATADINE 10 MG PO TABS
10.0000 mg | ORAL_TABLET | Freq: Every day | ORAL | Status: DC
  Administered 2018-01-28 – 2018-01-29 (×2): 10 mg via ORAL
  Filled 2018-01-28 (×2): qty 1

## 2018-01-28 MED ORDER — THROMBIN 20000 UNITS EX SOLR
CUTANEOUS | Status: AC
  Filled 2018-01-28: qty 20000

## 2018-01-28 SURGICAL SUPPLY — 71 items
ADH SKN CLS APL DERMABOND .7 (GAUZE/BANDAGES/DRESSINGS) ×1
APL SKNCLS STERI-STRIP NONHPOA (GAUZE/BANDAGES/DRESSINGS) ×1
BAG DECANTER FOR FLEXI CONT (MISCELLANEOUS) ×2 IMPLANT
BENZOIN TINCTURE PRP APPL 2/3 (GAUZE/BANDAGES/DRESSINGS) ×2 IMPLANT
BLADE CLIPPER SURG (BLADE) IMPLANT
BUR CUTTER 7.0 ROUND (BURR) ×1 IMPLANT
BUR MATCHSTICK NEURO 3.0 LAGG (BURR) ×2 IMPLANT
CANISTER SUCT 3000ML PPV (MISCELLANEOUS) ×2 IMPLANT
CAP LCK SPNE (Orthopedic Implant) ×6 IMPLANT
CAP LOCK SPINE RADIUS (Orthopedic Implant) IMPLANT
CAP LOCKING (Orthopedic Implant) ×12 IMPLANT
CARTRIDGE OIL MAESTRO DRILL (MISCELLANEOUS) ×1 IMPLANT
CONT SPEC 4OZ CLIKSEAL STRL BL (MISCELLANEOUS) ×2 IMPLANT
COVER BACK TABLE 60X90IN (DRAPES) ×2 IMPLANT
DECANTER SPIKE VIAL GLASS SM (MISCELLANEOUS) ×2 IMPLANT
DERMABOND ADVANCED (GAUZE/BANDAGES/DRESSINGS) ×1
DERMABOND ADVANCED .7 DNX12 (GAUZE/BANDAGES/DRESSINGS) ×1 IMPLANT
DEVICE INTERBODY ELEVATE 23X8 (Cage) ×4 IMPLANT
DEVICE INTERBODY ELEVATE 23X9 (Cage) ×2 IMPLANT
DIFFUSER DRILL AIR PNEUMATIC (MISCELLANEOUS) ×2 IMPLANT
DRAPE C-ARM 42X72 X-RAY (DRAPES) ×4 IMPLANT
DRAPE HALF SHEET 40X57 (DRAPES) IMPLANT
DRAPE LAPAROTOMY 100X72X124 (DRAPES) ×2 IMPLANT
DRAPE SURG 17X23 STRL (DRAPES) ×8 IMPLANT
DRSG OPSITE POSTOP 4X6 (GAUZE/BANDAGES/DRESSINGS) ×2 IMPLANT
DURAPREP 26ML APPLICATOR (WOUND CARE) ×2 IMPLANT
ELECT REM PT RETURN 9FT ADLT (ELECTROSURGICAL) ×2
ELECTRODE REM PT RTRN 9FT ADLT (ELECTROSURGICAL) ×1 IMPLANT
EVACUATOR 1/8 PVC DRAIN (DRAIN) IMPLANT
GAUZE SPONGE 4X4 12PLY STRL (GAUZE/BANDAGES/DRESSINGS) IMPLANT
GAUZE SPONGE 4X4 16PLY XRAY LF (GAUZE/BANDAGES/DRESSINGS) IMPLANT
GLOVE BIO SURGEON STRL SZ 6.5 (GLOVE) ×4 IMPLANT
GLOVE BIOGEL PI IND STRL 6.5 (GLOVE) IMPLANT
GLOVE BIOGEL PI IND STRL 7.0 (GLOVE) IMPLANT
GLOVE BIOGEL PI IND STRL 7.5 (GLOVE) IMPLANT
GLOVE BIOGEL PI INDICATOR 6.5 (GLOVE) ×1
GLOVE BIOGEL PI INDICATOR 7.0 (GLOVE) ×1
GLOVE BIOGEL PI INDICATOR 7.5 (GLOVE) ×1
GLOVE ECLIPSE 7.0 STRL STRAW (GLOVE) ×1 IMPLANT
GLOVE ECLIPSE 9.0 STRL (GLOVE) ×4 IMPLANT
GLOVE EXAM NITRILE LRG STRL (GLOVE) IMPLANT
GLOVE EXAM NITRILE XL STR (GLOVE) IMPLANT
GLOVE EXAM NITRILE XS STR PU (GLOVE) IMPLANT
GLOVE SURG SS PI 6.0 STRL IVOR (GLOVE) ×3 IMPLANT
GOWN STRL REUS W/ TWL LRG LVL3 (GOWN DISPOSABLE) IMPLANT
GOWN STRL REUS W/ TWL XL LVL3 (GOWN DISPOSABLE) ×2 IMPLANT
GOWN STRL REUS W/TWL 2XL LVL3 (GOWN DISPOSABLE) IMPLANT
GOWN STRL REUS W/TWL LRG LVL3 (GOWN DISPOSABLE) ×6
GOWN STRL REUS W/TWL XL LVL3 (GOWN DISPOSABLE) ×4
KIT BASIN OR (CUSTOM PROCEDURE TRAY) ×2 IMPLANT
KIT ROOM TURNOVER OR (KITS) ×2 IMPLANT
MILL MEDIUM DISP (BLADE) ×2 IMPLANT
NEEDLE HYPO 22GX1.5 SAFETY (NEEDLE) ×2 IMPLANT
NS IRRIG 1000ML POUR BTL (IV SOLUTION) ×2 IMPLANT
OIL CARTRIDGE MAESTRO DRILL (MISCELLANEOUS) ×2
PACK LAMINECTOMY NEURO (CUSTOM PROCEDURE TRAY) ×2 IMPLANT
ROD 5.5X60MM PURPLE (Rod) ×2 IMPLANT
SCREW 5.75 X 635 (Screw) ×4 IMPLANT
SCREW 5.75X40M (Screw) ×2 IMPLANT
SPACER SPNL STD 23X8XSTRL (Cage) IMPLANT
SPCR SPNL STD 23X8XSTRL (Cage) ×2 IMPLANT
SPONGE SURGIFOAM ABS GEL 100 (HEMOSTASIS) ×2 IMPLANT
STRIP CLOSURE SKIN 1/2X4 (GAUZE/BANDAGES/DRESSINGS) ×3 IMPLANT
SUT VIC AB 0 CT1 18XCR BRD8 (SUTURE) ×2 IMPLANT
SUT VIC AB 0 CT1 8-18 (SUTURE) ×4
SUT VIC AB 2-0 CT1 18 (SUTURE) ×3 IMPLANT
SUT VIC AB 3-0 SH 8-18 (SUTURE) ×4 IMPLANT
TOWEL GREEN STERILE (TOWEL DISPOSABLE) ×2 IMPLANT
TOWEL GREEN STERILE FF (TOWEL DISPOSABLE) ×2 IMPLANT
TRAY FOLEY W/METER SILVER 16FR (SET/KITS/TRAYS/PACK) ×2 IMPLANT
WATER STERILE IRR 1000ML POUR (IV SOLUTION) ×2 IMPLANT

## 2018-01-28 NOTE — Op Note (Signed)
Date of procedure: 01/28/2018  Date of dictation: Same  Service: Neurosurgery  Preoperative diagnosis: Grade 1 L4-5 degenerative spondylolisthesis with severe stenosis and neurogenic claudication, L5-S1 degenerative disc disease with right L5-S1 herniated nucleus pulposus  Postoperative diagnosis: Same  Procedure Name: Bilateral L4-5 and L5-S1 decompressive laminotomies and foraminotomies, more than would be required for simple interbody fusion alone.    L4-5, L5-S1 posterior lumbar interbody fusion utilizing interbody cages, local harvested autograft  L4-5 and S1 posterior lateral arthrodesis utilizing segmental pedicle screw fixation and locally harvested autograft  Surgeon:Jet Traynham A.Norvell Caswell, M.D.  Asst. Surgeon: Kathyrn Sheriff  Anesthesia: General  Indication: 62 year old female with back and bilateral lower extremity pain paresthesias and weakness as well as symptoms consistent with neurogenic claudication failing conservative management.  Workup demonstrates evidence of a grade 1 L4-5 degenerative spondylolisthesis with severe stenosis and a chronically L5-S1 rightward disc herniation with stenosis.  Patient has failed conservative management presents now for 2 level lumbar decompression and fusion.  Operative note: After induction of anesthesia, patient positioned prone onto the Wilson frame and appropriately padded.  Lumbar region prepped and draped sterilely.  Incision made overlying L4 S1.  Dissection performed bilaterally.  Retractor placed.  Fluoroscopy used.  Level was confirmed.  Decompressive laminotomies and facetectomy was performed bilaterally and L4-5 and L5-S1 removing the inferior aspect of the lamina of L4 the entire inferior facet of L4 the majority of the superior facet of L5 bilaterally the entire inferior facet of L5, the inferior aspect of the L5 lamina and the majority of the superior facet of S1.  Ligament flavum elevated resected bilaterally.  Foraminotomies completed on  course of the exiting L4, L5 and S1 nerve roots.  Bilateral discectomies and performed at L4-5 and L5-S1.  Displacement prepared for interbody fusion.  The disc space distractor placed in the patient's right side.  Disc space prepared for interbody fusion.  Soft tissue removed from the interspace.  A 9 mm Med[employer]onic expandable cage packed with locally harvested autograft and impacted in place at L4-5 and expanded to its full extent.  Distractor removed patient's right side.  This appeared in the right side.  Morselized autograft packed the interspace.  A second cage packed with autograft was then impacted into place and expanded to its full extent.  Procedure was then repeated at L5-S1 again without complication at this time using 8 mm implants.  Pedicles of L4-L5 and S1 were identified using surface landmarks and intraoperative fluoroscopy.  Superficial bone overlying the pedicle was then removed using a high-speed drill.  Each pedicle was then probed using a pedicle each pedicle all tract was probed and found to be solid within the bone.  Each pedicle tract was then tapped with a scooter.  Screw trouble was probed and found to be solid within the bone.  5.75 mm radius plate and screws from [employer] replace bilaterally at L4-L5 and S1.  Final images reveal good position of the cages and hardware at the proper level with normal alignment spine.  Wound is irrigated one final time.  Hemostasis was assured.  Transverse processes and residual facets were decorticated using high-speed drill.  Morselized autograft was packed posterolaterally.  Short segment titanium rods and posterior screw heads from L4-S1.  Locking Plate of the screw through the locking caps and engaged with the construct under compression.  Vancomycin powder was placed in the deep wound space.  Wound is not closed in layers with Vicryl sutures.  Steri-Strips and sterile dressing were applied.  No  apparent complications.  Patient tolerated the  procedure well and she returns to the recovery room postop

## 2018-01-28 NOTE — Transfer of Care (Signed)
Immediate Anesthesia Transfer of Care Note  Patient: Cynthia Ferguson  Procedure(s) Performed: Posterior Lumbar Interbody Fusion Lumbar four-five, Lumbar five-Sacral one (N/A Back)  Patient Location: PACU  Anesthesia Type:General  Level of Consciousness: awake, sedated and patient cooperative  Airway & Oxygen Therapy: Patient Spontanous Breathing and Patient connected to nasal cannula oxygen  Post-op Assessment: Report given to RN, Post -op Vital signs reviewed and stable, Patient moving all extremities and Patient moving all extremities X 4  Post vital signs: Reviewed and stable  Last Vitals:  Vitals Value Taken Time  BP 146/71 01/28/2018  2:55 PM  Temp    Pulse 84 01/28/2018  3:00 PM  Resp 14 01/28/2018  3:00 PM  SpO2 94 % 01/28/2018  3:00 PM  Vitals shown include unvalidated device data.  Last Pain:  Vitals:   01/28/18 0908  TempSrc:   PainSc: 0-No pain      Patients Stated Pain Goal: 3 (56/86/16 [id number])  Complications: No apparent anesthesia complications

## 2018-01-28 NOTE — Progress Notes (Signed)
Orthopedic Tech Progress Note Patient Details:  Cynthia Ferguson 1956-10-01 [id number] Patient has brace. Patient ID: Cynthia Ferguson, female   DOB: [birth year], 62 y.o.   MRN: [id number]   Braulio Bosch 01/28/2018, 4:11 PM

## 2018-01-28 NOTE — H&P (Signed)
Cynthia Ferguson is an 62 y.o. female.   Chief Complaint: Back pain HPI: 62 year old female with chronic and progressive Lee worsening lower back pain with radiation into both lower extremities failing conservative management.  Workup demonstrates evidence of degenerative spondylolisthesis with severe stenosis at L4-5 as well as disc degeneration with a disc herniation off to the right side at L5-S1.  Patient has failed conservative management including injections and therapy.  She presents now for decompression and fusion surgery in hopes of improving her symptoms.  Past Medical History:  Diagnosis Date  . Anxiety   . Arthritis   . Bladder incontinence   . Cancer ([place]) 2014   left breast   . Complication of anesthesia    "had trouble waking up"  . GERD (gastroesophageal reflux disease)   . Headache(784.0)    migraines  . High cholesterol   . Hypertension   . Personal history of radiation therapy 2014    Past Surgical History:  Procedure Laterality Date  . ABDOMINAL HYSTERECTOMY     COMPLETE   . BREAST EXCISIONAL BIOPSY Left    age 63...benign  . BREAST LUMPECTOMY Left 2014  . BREAST LUMPECTOMY WITH NEEDLE LOCALIZATION Left 07/11/2013   Procedure: BREAST LUMPECTOMY WITH NEEDLE LOCALIZATION;  Surgeon: Rolm Bookbinder, MD;  Location: [facility];  Service: General;  Laterality: Left;  needle localization at [facility] 11:00   . BREAST SURGERY     LEFT BREAST LUMPECTOMY - BENIGN  . BUNIONECTOMY  1993   LEFT FOOT  . BUNIONECTOMY Right NOVEMBER 2013   HAMMER TOE  . NECK SURGERY  2002   DISC REPLACED IN NECK  . right knee athroscopy     2006  . TUBAL LIGATION      Family History  Problem Relation Age of Onset  . Diabetes Mother   . Hypertension Mother   . Heart disease Mother   . Hypertension Father   . Heart disease Father   . Lung cancer Father   . Liver cancer Father   . Cancer Father   . Hypertension Sister   . Hypertension Brother   . Hypertension Sister   .  Breast cancer Maternal Aunt   . Breast cancer Paternal Aunt    Social History:  reports that she has never smoked. She has never used smokeless tobacco. She reports that she does not drink alcohol or use drugs.  Allergies:  Allergies  Allergen Reactions  . Benzonatate Anaphylaxis  . Amlodipine Swelling  . Dicloxacillin Rash and Other (See Comments)    Rash and numbness on face  . Trimethoprim Rash    Medications Prior to Admission  Medication Sig Dispense Refill  . acetaminophen (TYLENOL) 325 MG tablet Take 650 mg by mouth every 6 (six) hours as needed for mild pain or moderate pain.     Marland Kitchen ALPRAZolam (XANAX) 0.5 MG tablet Take 0.25-0.5 mg by mouth 2 (two) times daily. for sleep and anxiety. 0.25 mg Twice daily (as needed), 0.5 mg at bedtime    . aspirin-acetaminophen-caffeine (EXCEDRIN MIGRAINE) 250-250-65 MG per tablet Take 2 tablets by mouth as needed.    . calcium-vitamin D (OSCAL) 250-125 MG-UNIT per tablet Take 2 tablets by mouth 2 (two) times daily.     . cetirizine (ZYRTEC) 10 MG tablet Take 10 mg by mouth at bedtime.    . diclofenac (VOLTAREN) 50 MG EC tablet Take 50 mg by mouth 2 (two) times daily.    . diphenhydrAMINE (BENADRYL) 25 MG tablet Take  25 mg by mouth daily.    . hydrochlorothiazide (HYDRODIURIL) 25 MG tablet Take 25 mg by mouth daily.    Marland Kitchen losartan (COZAAR) 100 MG tablet Take 100 mg by mouth daily.    . Multiple Vitamins-Calcium (ONE-A-DAY WOMENS FORMULA PO) Take 1 tablet by mouth daily.    . multivitamin-lutein (OCUVITE-LUTEIN) CAPS capsule Take 1 capsule by mouth daily.    . nitrofurantoin (MACRODANTIN) 100 MG capsule Take 100 mg by mouth daily. UTI Prevention    . omeprazole (PRILOSEC) 20 MG capsule Take 20 mg by mouth daily.    . polyethylene glycol (MIRALAX / GLYCOLAX) packet Take 17 g by mouth daily as needed for mild constipation.     . Probiotic Product (PROBIOTIC DAILY PO) Take 1 capsule by mouth daily.    Marland Kitchen Propylene Glycol (SYSTANE BALANCE) 0.6 % SOLN  Place 1 drop into both eyes 3 (three) times daily as needed (dry eyes).    Marland Kitchen aspirin EC 81 MG tablet Take 81 mg by mouth daily.      No results found for this or any previous visit (from the past 48 hour(s)). No results found.  Pertinent items noted in HPI and remainder of comprehensive ROS otherwise negative.  Blood pressure (!) 151/70, pulse 85, temperature 97.9 F (36.6 C), temperature source Oral, resp. rate 20, height 5' 2.5" (1.588 m), weight 80.7 kg (178 lb), SpO2 98 %. Patient is awake and alert.  She is oriented and appropriate.  Speech is fluent.  Judgment and insight are intact.  Cranial nerve function normal bilaterally.  Motor examination reveals some mild weakness of dorsiflexion on the right side otherwise motor strength intact.  Sensory examination with decreased sensation to pinprick and light touch in her right L5 and S1 dermatomes.  Deep tender mixes normal active supper Achilles reflexes are absent bilaterally.  No evidence of long track signs.  Gait is antalgic.  Posture is flexed.  Examination head ears eyes nose throat is unremarkable her chest and abdomen are benign.  Extremities are free from injury deformity.  Assessment/Plan L4-L5 degenerative spondylolisthesis with stenosis and neurogenic claudication.  Right L5-S1 herniated mucous pulposis with associated disc herniation.  Plan bilateral L4-5 and L5-S1 decompressive laminotomies and foraminotomies followed by posterior lumbar interbody fusion utilizing interbody cages, locally harvested autograft, and anterior plate instrumentation.  Risks and benefits of been explained.  Patient wishes to proceed.  Mallie Mussel A Raiza Kiesel 01/28/2018, 10:49 AM

## 2018-01-28 NOTE — Brief Op Note (Signed)
01/28/2018  2:24 PM  PATIENT:  Cynthia Ferguson  62 y.o. female  PRE-OPERATIVE DIAGNOSIS:  Spondylolisthesis  POST-OPERATIVE DIAGNOSIS:  Spondylolisthesis  PROCEDURE:  Procedure(s): Posterior Lumbar Interbody Fusion Lumbar four-five, Lumbar five-Sacral one (N/A)  SURGEON:  Surgeon(s) and Role:    * Earnie Larsson, MD - Primary    * Consuella Lose, MD - Assisting  PHYSICIAN ASSISTANT:   ASSISTANTS:    ANESTHESIA:   general  EBL:  250 mL   BLOOD ADMINISTERED:none  DRAINS: none   LOCAL MEDICATIONS USED:  MARCAINE     SPECIMEN:  No Specimen  DISPOSITION OF SPECIMEN:  N/A  COUNTS:  YES  TOURNIQUET:  * No tourniquets in log *  DICTATION: .Dragon Dictation  PLAN OF CARE: Admit to inpatient   PATIENT DISPOSITION:  PACU - hemodynamically stable.   Delay start of Pharmacological VTE agent (>24hrs) due to surgical blood loss or risk of bleeding: yes

## 2018-01-28 NOTE — Anesthesia Procedure Notes (Signed)
Procedure Name: Intubation Date/Time: 01/28/2018 11:25 AM Performed by: Izora Gala, CRNA Pre-anesthesia Checklist: Patient identified, Emergency Drugs available, Suction available and Patient being monitored Patient Re-evaluated:Patient Re-evaluated prior to induction Oxygen Delivery Method: Circle system utilized Preoxygenation: Pre-oxygenation with 100% oxygen Induction Type: IV induction Ventilation: Mask ventilation without difficulty Laryngoscope Size: Miller and 3 Grade View: Grade II Tube type: Oral Tube size: 7.0 mm Number of attempts: 1 Placement Confirmation: ETT inserted through vocal cords under direct vision,  CO2 detector and breath sounds checked- equal and bilateral Secured at: 22 cm Tube secured with: Tape Dental Injury: Teeth and Oropharynx as per pre-operative assessment

## 2018-01-28 NOTE — Anesthesia Preprocedure Evaluation (Addendum)
Anesthesia Evaluation  Patient identified by MRN, date of birth, ID band Patient awake    Reviewed: Allergy & Precautions, NPO status , Patient's Chart, lab work & pertinent test results  History of Anesthesia Complications (+) PROLONGED EMERGENCE and history of anesthetic complications  Airway Mallampati: I       Dental no notable dental hx. (+) Teeth Intact   Pulmonary neg pulmonary ROS,    Pulmonary exam normal breath sounds clear to auscultation       Cardiovascular hypertension, Pt. on medications Normal cardiovascular exam Rhythm:Regular Rate:Normal     Neuro/Psych PSYCHIATRIC DISORDERS Anxiety    GI/Hepatic Neg liver ROS, GERD  Medicated and Controlled,  Endo/Other  negative endocrine ROS  Renal/GU negative Renal ROS  negative genitourinary   Musculoskeletal   Abdominal (+) + obese,   Peds  Hematology negative hematology ROS (+)   Anesthesia Other Findings   Reproductive/Obstetrics                            Anesthesia Physical Anesthesia Plan  ASA: II  Anesthesia Plan: General   Post-op Pain Management:    Induction: Intravenous  PONV Risk Score and Plan: 4 or greater and Ondansetron, Dexamethasone and Midazolam  Airway Management Planned: Oral ETT  Additional Equipment:   Intra-op Plan:   Post-operative Plan: Extubation in OR  Informed Consent: I have reviewed the patients History and Physical, chart, labs and discussed the procedure including the risks, benefits and alternatives for the proposed anesthesia with the patient or authorized representative who has indicated his/her understanding and acceptance.   Dental advisory given  Plan Discussed with: CRNA and Surgeon  Anesthesia Plan Comments:         Anesthesia Quick Evaluation

## 2018-01-28 NOTE — Anesthesia Postprocedure Evaluation (Signed)
Anesthesia Post Note  Patient: Cynthia Ferguson  Procedure(s) Performed: Posterior Lumbar Interbody Fusion Lumbar four-five, Lumbar five-Sacral one (N/A Back)     Patient location during evaluation: PACU Anesthesia Type: General Level of consciousness: awake Pain management: pain level controlled Vital Signs Assessment: post-procedure vital signs reviewed and stable Respiratory status: spontaneous breathing Cardiovascular status: stable Postop Assessment: no apparent nausea or vomiting Anesthetic complications: no    Last Vitals:  Vitals:   01/28/18 1545 01/28/18 1550  BP:    Pulse: 71 69  Resp: 10 (!) 8  Temp:    SpO2: 100% 99%    Last Pain:  Vitals:   01/28/18 1540  TempSrc:   PainSc: Asleep   Pain Goal: Patients Stated Pain Goal: 3 (01/28/18 0908)               [name],JOHN Mateo Flow

## 2018-01-29 MED ORDER — DIAZEPAM 5 MG PO TABS: 5.0000 mg | ORAL_TABLET | Freq: Four times a day (QID) | ORAL | 0 refills | Status: DC | PRN

## 2018-01-29 MED ORDER — OXYCODONE HCL 10 MG PO TABS: 5.0000 mg | ORAL_TABLET | ORAL | 0 refills | Status: DC | PRN

## 2018-01-29 MED FILL — Thrombin For Soln 20000 Unit: CUTANEOUS | Qty: 1 | Status: AC

## 2018-01-29 NOTE — Evaluation (Signed)
Occupational Therapy Evaluation Patient Details Name: Cynthia Ferguson MRN: [id number] DOB: [birth year] Today's Date: 01/29/2018    History of Present Illness Pt is a 62 y.o. female s/p L 4-5, L5-S1 PLIF. She has a PMH significant for anxiety, arthritis, bladder incontenence, cancer, GERD, headache, high cholesterol, and hypertension.    Clinical Impression   PTA, pt was independent with ADL and functional mobility and living alone. She currently presents with post-operative back pain, decreased balance, and anxiety impacting her independence and safety with ADL and functional mobility. She requires mod assist for LB ADL, min assist for toilet transfers without RW, min guard assist for toilet transfers with RW, and min assist for tub transfers with 3-in-1. Pt reports that RW may not fit in bathroom for use during tub and toilet transfers. Educated pt concerning back precautions and compensatory strategies to adhere to these during daily routine. She would benefit from continued reinforcement of these precautions as anxiety was limiting ability to attend to education. Pt reports that she plans to return home alone post-acute D/C. Encouraged pt to reach out to family members for initial 24 hour support as she is unsteady on her feet and unsafe with LB ADL and tub transfers. She reports she will likely be able to obtain assistance. Pt would benefit from continued OT services while admitted to improve independence and safety with ADL and functional mobility. Recommend home health OT follow-up post-acute D/C to address ADL participation in pt's home environment to maximize safety and ability to adhere to precautions. Additionally recommend 3-in-1 BSC as well as AE for LB ADL. Pt reports she will be borrowing AE from her niece. OT will continue to follow while admitted.     Follow Up Recommendations  Home health OT;Supervision/Assistance - 24 hour(initial 24 hour support)    Equipment Recommendations  3 in 1  bedside commode    Recommendations for Other Services       Precautions / Restrictions Precautions Precautions: Back Precaution Booklet Issued: Yes (comment) Precaution Comments: Reviewed back precautions in detail.  Required Braces or Orthoses: Spinal Brace Spinal Brace: Lumbar corset;Applied in sitting position Restrictions Weight Bearing Restrictions: No      Mobility Bed Mobility               General bed mobility comments: Seated at EOB on my arrival.  Transfers Overall transfer level: Needs assistance Equipment used: Rolling walker (2 wheeled);None Transfers: Sit to/from Stand Sit to Stand: Min guard         General transfer comment: Min guard assist for safety. Completed both with and without RW and requiring cues with RW for safe hand placement.     Balance Overall balance assessment: Needs assistance Sitting-balance support: No upper extremity supported;Feet supported Sitting balance-Leahy Scale: Good     Standing balance support: Bilateral upper extremity supported;No upper extremity supported Standing balance-Leahy Scale: Fair Standing balance comment: Able to statically stand for static tasks without UE support but requires UE support or external assistance for dynamic tasks.                            ADL either performed or assessed with clinical judgement   ADL Overall ADL's : Needs assistance/impaired Eating/Feeding: Set up;Sitting   Grooming: Supervision/safety;Standing   Upper Body Bathing: Set up;Sitting   Lower Body Bathing: Sit to/from stand;Moderate assistance   Upper Body Dressing : Set up;Sitting   Lower Body Dressing: Moderate assistance;Sit to/from stand  Toilet Transfer: Ambulation;Minimal Print production planner Details (indicate cue type and reason): Without RW, requires min assist and RW may not fit in bathroom.  Toileting- Clothing Manipulation and Hygiene: Moderate assistance;Sit to/from stand   Tub/  Shower Transfer: Minimal assistance;Ambulation;Rolling walker;3 in 1   Functional mobility during ADLs: Min guard;Minimal assistance General ADL Comments: Min assist without RW and min guard with RW for toilet transfers. Pt educated concerning compensatory dressing, bathing, and toileting hygiene strategies. Attempted multiple tub transfer options with pt with great difficulty completing. She will require assistance for getting in the shower. Unsure if RW will fit into bathroom to get close enough to shower. Recommended to pt that she complete wash-ups until able to practice shower transfers with [facility] therapist.      Vision Baseline Vision/History: Wears glasses Patient Visual Report: No change from baseline Vision Assessment?: No apparent visual deficits     Perception     Praxis      Pertinent Vitals/Pain Pain Assessment: Faces Faces Pain Scale: Hurts little more Pain Location: back Pain Descriptors / Indicators: Aching;Operative site guarding Pain Intervention(s): Limited activity within patient's tolerance;Monitored during session;Repositioned     Hand Dominance Right   Extremity/Trunk Assessment Upper Extremity Assessment Upper Extremity Assessment: Generalized weakness   Lower Extremity Assessment Lower Extremity Assessment: Defer to PT evaluation       Communication Communication Communication: No difficulties   Cognition Arousal/Alertness: Awake/alert Behavior During Therapy: Anxious Overall Cognitive Status: Within Functional Limits for tasks assessed                                 General Comments: Pt demonstrating anxiety concerning abilities to manage at home. Anxiety impacting pt's ability to attend to education topics.    General Comments       Exercises     Shoulder Instructions      Home Living Family/patient expects to be discharged to:: Private residence Living Arrangements: Alone Available Help at Discharge: Family Type of Home:  House Home Access: Stairs to enter Technical brewer of Steps: 3+2   Home Layout: One level     Bathroom Shower/Tub: Teacher, early years/pre: Standard     Home Equipment: Hand held shower head          Prior Functioning/Environment Level of Independence: Independent                 OT Problem List: Decreased strength;Decreased range of motion;Decreased activity tolerance;Impaired balance (sitting and/or standing);Decreased knowledge of use of DME or AE;Decreased safety awareness;Decreased knowledge of precautions;Pain      OT Treatment/Interventions: Self-care/ADL training;Therapeutic exercise;Energy conservation;DME and/or AE instruction;Therapeutic activities;Patient/family education;Balance training    OT Goals(Current goals can be found in the care plan section) Acute Rehab OT Goals Patient Stated Goal: to be safe OT Goal Formulation: With patient Time For Goal Achievement: 02/12/18 Potential to Achieve Goals: Good ADL Goals Pt Will Perform Grooming: with modified independence;standing Pt Will Perform Lower Body Dressing: with modified independence;with adaptive equipment;sit to/from stand Pt Will Transfer to Toilet: with modified independence;ambulating;bedside commode(BSC over toilet) Pt Will Perform Toileting - Clothing Manipulation and hygiene: with modified independence;sit to/from stand Pt Will Perform Tub/Shower Transfer: with modified independence;Tub transfer;3 in 1;rolling walker  OT Frequency: Min 2X/week   Barriers to D/C:            Co-evaluation  AM-PAC PT "6 Clicks" Daily Activity     Outcome Measure Help from another person eating meals?: A Little Help from another person taking care of personal grooming?: A Little Help from another person toileting, which includes using toliet, bedpan, or urinal?: A Little Help from another person bathing (including washing, rinsing, drying)?: A Lot Help from another person  to put on and taking off regular upper body clothing?: A Little Help from another person to put on and taking off regular lower body clothing?: A Lot 6 Click Score: 16   End of Session Equipment Utilized During Treatment: Rolling walker;Back brace Nurse Communication: Mobility status  Activity Tolerance: Patient tolerated treatment well Patient left: in bed;with call bell/phone within reach(seated at EOB)  OT Visit Diagnosis: Other abnormalities of gait and mobility (R26.89);Pain Pain - part of body: (back)                Time: 6962-9528 OT Time Calculation (min): 55 min Charges:  OT General Charges $OT Visit: 1 Visit OT Evaluation $OT Eval Moderate Complexity: 1 Mod OT Treatments $Self Care/Home Management : 38-52 mins G-Codes:     Norman Herrlich, MS OTR/L  Pager: [place] A Sydni Elizarraraz 01/29/2018, 9:25 AM

## 2018-01-29 NOTE — Evaluation (Signed)
Physical Therapy Evaluation Patient Details Name: Cynthia Ferguson MRN: [id number] DOB: 14-Oct-1956 Today's Date: 01/29/2018   History of Present Illness  Pt is a 62 y.o. female s/p L 4-5, L5-S1 PLIF. She has a PMH significant for anxiety, arthritis, bladder incontenence, cancer, GERD, headache, high cholesterol, and hypertension.   Clinical Impression  Pt admitted with above diagnosis. Pt currently with functional limitations due to the deficits listed below (see PT Problem List). At the time of PT eval pt was able to perform transfers and ambulation with gross min guard assist for balance support and safety with RW. Pt anxious and fearful of returning home alone. She voices concern with managing alone however has not asked any family to assist her yet. After our session, pt was to call family members to try and get some support at home. Pt is mobilizing well enough with the RW that I feel she could progress away from 24 hour assist pretty quickly, however here at first feel it is in the patient's best interest to have someone with her at home. She is at a high risk of falls and requires cues for safety. Acutely, pt will benefit from skilled PT to increase their independence and safety with mobility to allow discharge to the venue listed below.       Follow Up Recommendations Home health PT;Supervision for mobility/OOB (24 hour initially)    Equipment Recommendations  Rolling walker with 5" wheels    Recommendations for Other Services       Precautions / Restrictions Precautions Precautions: Back Precaution Booklet Issued: Yes (comment) Precaution Comments: Reviewed back precautions in detail.  Required Braces or Orthoses: Spinal Brace Spinal Brace: Lumbar corset;Applied in sitting position Restrictions Weight Bearing Restrictions: No      Mobility  Bed Mobility               General bed mobility comments: Seated at EOB upon PT arrival  Transfers Overall transfer level: Needs  assistance Equipment used: Rolling walker (2 wheeled);None Transfers: Sit to/from Stand Sit to Stand: Min guard         General transfer comment: VC's for hand placement on seated surface for safety.   Ambulation/Gait Ambulation/Gait assistance: Min guard Ambulation Distance (Feet): 400 Feet Assistive device: Rolling walker (2 wheeled) Gait Pattern/deviations: Step-through pattern;Decreased stride length;Trunk flexed Gait velocity: Decreased Gait velocity interpretation: Below normal speed for age/gender General Gait Details: VC's for improved posture and general safety with mobility and the RW. Pt reports "it feels good to walk."  Stairs Stairs: Yes Stairs assistance: Min guard Stair Management: One rail Left;Step to pattern;Forwards Number of Stairs: 3 General stair comments: VC's for sequencing and general safety.   Wheelchair Mobility    Modified Rankin (Stroke Patients Only)       Balance Overall balance assessment: Needs assistance Sitting-balance support: No upper extremity supported;Feet supported Sitting balance-Leahy Scale: Good     Standing balance support: Bilateral upper extremity supported;No upper extremity supported Standing balance-Leahy Scale: Fair Standing balance comment: Able to statically stand for static tasks without UE support but requires UE support or external assistance for dynamic tasks.                              Pertinent Vitals/Pain Pain Assessment: Faces Faces Pain Scale: Hurts a little bit Pain Location: back Pain Descriptors / Indicators: Aching;Operative site guarding Pain Intervention(s): Monitored during session    Home Living Family/patient expects to be  discharged to:: Private residence Living Arrangements: Alone Available Help at Discharge: Family Type of Home: House Home Access: Stairs to enter   Technical brewer of Steps: 3+2 Home Layout: One level Home Equipment: Hand held shower head       Prior Function Level of Independence: Independent               Hand Dominance   Dominant Hand: Right    Extremity/Trunk Assessment   Upper Extremity Assessment Upper Extremity Assessment: Defer to OT evaluation    Lower Extremity Assessment Lower Extremity Assessment: Generalized weakness(Consistent with pre-op diagnosis)    Cervical / Trunk Assessment Cervical / Trunk Assessment: Other exceptions Cervical / Trunk Exceptions: s/p surgery  Communication   Communication: No difficulties  Cognition Arousal/Alertness: Awake/alert Behavior During Therapy: Anxious Overall Cognitive Status: Within Functional Limits for tasks assessed                                 General Comments: Pt demonstrating anxiety concerning abilities to manage at home. Anxiety impacting pt's ability to attend to education topics.       General Comments      Exercises     Assessment/Plan    PT Assessment Patient needs continued PT services  PT Problem List Decreased strength;Decreased range of motion;Decreased activity tolerance;Decreased balance;Decreased mobility;Decreased knowledge of use of DME;Decreased safety awareness;Decreased knowledge of precautions;Pain       PT Treatment Interventions DME instruction;Gait training;Stair training;Functional mobility training;Therapeutic activities;Therapeutic exercise;Neuromuscular re-education;Patient/family education    PT Goals (Current goals can be found in the Care Plan section)  Acute Rehab PT Goals Patient Stated Goal: to be safe PT Goal Formulation: With patient Time For Goal Achievement: 02/05/18 Potential to Achieve Goals: Good    Frequency Min 5X/week   Barriers to discharge        Co-evaluation               AM-PAC PT "6 Clicks" Daily Activity  Outcome Measure Difficulty turning over in bed (including adjusting bedclothes, sheets and blankets)?: A Little Difficulty moving from lying on back to  sitting on the side of the bed? : A Little Difficulty sitting down on and standing up from a chair with arms (e.g., wheelchair, bedside commode, etc,.)?: A Little Help needed moving to and from a bed to chair (including a wheelchair)?: A Little Help needed walking in hospital room?: A Little Help needed climbing 3-5 steps with a railing? : A Little 6 Click Score: 18    End of Session Equipment Utilized During Treatment: Gait belt;Back brace Activity Tolerance: Patient tolerated treatment well Patient left: with call bell/phone within reach(Sitting EOB) Nurse Communication: Mobility status PT Visit Diagnosis: Unsteadiness on feet (R26.81);Pain;Other abnormalities of gait and mobility (R26.89) Pain - part of body: (back)    Time: 5102-5852 PT Time Calculation (min) (ACUTE ONLY): 17 min   Charges:   PT Evaluation $PT Eval Moderate Complexity: 1 Mod     PT G Codes:        Rolinda Roan, PT, DPT Acute Rehabilitation Services Pager: [phone]   Thelma Comp 01/29/2018, 10:09 AM

## 2018-01-29 NOTE — Discharge Summary (Signed)
Physician Discharge Summary  Patient ID: Cynthia Ferguson MRN: [id number] DOB/AGE: 1956-03-10 62 y.o.  Admit date: 01/28/2018 Discharge date: 01/29/2018  Admission Diagnoses:  Discharge Diagnoses:  Active Problems:   Degenerative spondylolisthesis   Discharged Condition: good  Hospital Course: Patient admitted to the hospital where she underwent uncomplicated 2 level lumbar decompression and fusion.  Postoperatively patient is doing well.  Preoperative lower extremity pain much improved.  Ambulating without difficulty.  Ready for discharge home.  Consults:   Significant Diagnostic Studies:   Treatments:   Discharge Exam: Blood pressure 120/60, pulse 74, temperature 98.8 F (37.1 C), temperature source Oral, resp. rate 18, height 5' 2.5" (1.588 m), weight 80.7 kg (178 lb), SpO2 100 %. Awake and alert.  Oriented and appropriate.  Cranial nerve function intact.  Motor and sensory function extremities normal.  Wound clean and dry.  Chest and abdomen benign.  Disposition: Discharge disposition: 01-Home or Self Care       Discharge Instructions    Face-to-face encounter (required for Medicare/Medicaid patients)   Complete by:  As directed    I Charlie Pitter certify that this patient is under my care and that I, or a nurse practitioner or physician's assistant working with me, had a face-to-face encounter that meets the physician face-to-face encounter requirements with this patient on 01/29/2018. The encounter with the patient was in whole, or in part for the following medical condition(s) which is the primary reason for home health care (List medical condition): debenerative spondylolithesis   The encounter with the patient was in whole, or in part, for the following medical condition, which is the primary reason for home health care:  degenerative spondylolithesis   I certify that, based on my findings, the following services are medically necessary home health services:  Physical  therapy   Reason for Medically Necessary Home Health Services:  Therapy- Home Adaptation to Facilitate Safety   My clinical findings support the need for the above services:  Pain interferes with ambulation/mobility   Further, I certify that my clinical findings support that this patient is homebound due to:  Ambulates short distances less than 300 feet   Home Health   Complete by:  As directed    To provide the following care/treatments:   PT OT       Allergies as of 01/29/2018      Reactions   Benzonatate Anaphylaxis   Amlodipine Swelling   Dicloxacillin Rash, Other (See Comments)   Rash and numbness on face   Trimethoprim Rash      Medication List    TAKE these medications   acetaminophen 325 MG tablet Commonly known as:  TYLENOL Take 650 mg by mouth every 6 (six) hours as needed for mild pain or moderate pain.   ALPRAZolam 0.5 MG tablet Commonly known as:  XANAX Take 0.25-0.5 mg by mouth 2 (two) times daily. for sleep and anxiety. 0.25 mg Twice daily (as needed), 0.5 mg at bedtime   aspirin EC 81 MG tablet Take 81 mg by mouth daily.   aspirin-acetaminophen-caffeine 250-250-65 MG tablet Commonly known as:  EXCEDRIN MIGRAINE Take 2 tablets by mouth as needed.   calcium-vitamin D 250-125 MG-UNIT tablet Commonly known as:  OSCAL Take 2 tablets by mouth 2 (two) times daily.   cetirizine 10 MG tablet Commonly known as:  ZYRTEC Take 10 mg by mouth at bedtime.   diazepam 5 MG tablet Commonly known as:  VALIUM Take 1-2 tablets (5-10 mg total) by mouth every 6 (  six) hours as needed for muscle spasms.   diclofenac 50 MG EC tablet Commonly known as:  VOLTAREN Take 50 mg by mouth 2 (two) times daily.   diphenhydrAMINE 25 MG tablet Commonly known as:  BENADRYL Take 25 mg by mouth daily.   hydrochlorothiazide 25 MG tablet Commonly known as:  HYDRODIURIL Take 25 mg by mouth daily.   losartan 100 MG tablet Commonly known as:  COZAAR Take 100 mg by mouth daily.    multivitamin-lutein Caps capsule Take 1 capsule by mouth daily.   nitrofurantoin 100 MG capsule Commonly known as:  MACRODANTIN Take 100 mg by mouth daily. UTI Prevention   omeprazole 20 MG capsule Commonly known as:  PRILOSEC Take 20 mg by mouth daily.   ONE-A-DAY WOMENS FORMULA PO Take 1 tablet by mouth daily.   Oxycodone HCl 10 MG Tabs Take 0.5-1 tablets (5-10 mg total) by mouth every 3 (three) hours as needed for severe pain ((score 7 to 10)).   polyethylene glycol packet Commonly known as:  MIRALAX / GLYCOLAX Take 17 g by mouth daily as needed for mild constipation.   PROBIOTIC DAILY PO Take 1 capsule by mouth daily.   SYSTANE BALANCE 0.6 % Soln Generic drug:  Propylene Glycol Place 1 drop into both eyes 3 (three) times daily as needed (dry eyes).            Durable Medical Equipment  (From admission, onward)        Start     Ordered   01/28/18 1610  DME Walker rolling  Once    Question:  Patient needs a walker to treat with the following condition  Answer:  Degenerative spondylolisthesis   01/28/18 1609   01/28/18 1610  DME 3 n 1  Once     01/28/18 1609       Signed: Cooper Render Damian Hofstra 01/29/2018, 10:28 AM

## 2018-01-29 NOTE — Discharge Instructions (Signed)

## 2018-01-29 NOTE — Care Management Note (Signed)
Case Management Note  Patient Details  Name: Cynthia Ferguson MRN: [id number] Date of Birth: [birth year]  Subjective/Objective:   62 yr old female s/p L4-5, L5-S1 decompressive laminectomies.                  Action/Plan: Case manager spoke with patient concerning discharge plan . Choice for Home Health Agency was offered, referral was called to [place]. Patient says she will have support of different family members during her recovery.    Expected Discharge Date:  01/29/18               Expected Discharge Plan:  [place]  In-House Referral:  NA  Discharge planning Services  CM Consult  Post Acute Care Choice:  Home Health Choice offered to:  Patient  DME Arranged:  N/A DME Agency:  NA  HH Arranged:  PT, OT HH Agency:  [facility]  Status of Service:  Completed, signed off  If discussed at [place] of Stay Meetings, dates discussed:    Additional Comments:  Ninfa Meeker, RN 01/29/2018, 1:04 PM

## 2018-01-30 MED FILL — Sodium Chloride IV Soln 0.9%: INTRAVENOUS | Qty: 1000 | Status: AC

## 2018-01-30 MED FILL — Heparin Sodium (Porcine) Inj 1000 Unit/ML: INTRAMUSCULAR | Qty: 30 | Status: AC

## 2018-04-26 ENCOUNTER — Ambulatory Visit: Payer: BLUE CROSS/BLUE SHIELD | Admitting: Obstetrics & Gynecology

## 2018-04-26 ENCOUNTER — Encounter: Payer: Self-pay | Admitting: Obstetrics & Gynecology

## 2018-04-26 VITALS — BP 126/84

## 2018-04-26 DIAGNOSIS — D0512 Intraductal carcinoma in situ of left breast: Secondary | ICD-10-CM | POA: Diagnosis not present

## 2018-04-26 DIAGNOSIS — N644 Mastodynia: Secondary | ICD-10-CM

## 2018-04-26 NOTE — Progress Notes (Signed)
    Cynthia Ferguson 1955/12/23 [id number]        62 y.o.  G2P2002   RP: Left breast redness x 2 days  HPI: Left breast redness upper middle x 2 days, now improved since this morning.  Was associated with mild tenderness, but no breast pain currently.  No breast lump felt.  No nipple d/c.  No fever.  H/O Left DCIS in 2014.  Left breast Bx 06/2017 benign.  Left Dx Mammo benign 12/2017, scheduled for F/U 06/2018.     OB History  Gravida Para Term Preterm AB Living  2 2 2     2   SAB TAB Ectopic Multiple Live Births          2    # Outcome Date GA Lbr Len/2nd Weight Sex Delivery Anes PTL Lv  2 Term     M Vag-Spont  N LIV  1 Term     M Vag-Spont  N LIV    Past medical history,surgical history, problem list, medications, allergies, family history and social history were all reviewed and documented in the EPIC chart.   Directed ROS with pertinent positives and negatives documented in the history of present illness/assessment and plan.  Exam:  Vitals:   04/26/18 1345  BP: 126/84   General appearance:  Normal  Breast exam:  Rt breast normal, Rt axilla normal.                         Lt breast normal, no erythema, no nodule or mass felt, non-tender.  No nipple d/c.  Lt axilla normal.   Assessment/Plan:  62 y.o. G2P2002   1. Breast tenderness in female Normal breast exam.  Resolved tenderness and redness.  No nodule or mass felt.  No evidence of mastitis.  Keep next Mammogram scheduled 06/2018.  2. Ductal carcinoma in situ (DCIS) of left breast History of left breast lumpectomy in 2014, followed by radiation therapy and tamoxifen.  F/U Annual/Gyn visit.  Counseling on above issues and coordination of care more than 50% for 15 minutes.  Princess Bruins MD, 2:05 PM 04/26/2018

## 2018-04-28 ENCOUNTER — Encounter: Payer: Self-pay | Admitting: Obstetrics & Gynecology

## 2018-04-28 NOTE — Patient Instructions (Signed)
1. Breast tenderness in female Normal breast exam.  Resolved tenderness and redness.  No nodule or mass felt.  No evidence of mastitis.  Keep next Mammogram scheduled 06/2018.  2. Ductal carcinoma in situ (DCIS) of left breast History of left breast lumpectomy in 2014, followed by radiation therapy and tamoxifen.  F/U Annual/Gyn visit.  Cynthia Ferguson, it was a pleasure seeing you today!

## 2018-05-21 ENCOUNTER — Other Ambulatory Visit: Payer: Self-pay | Admitting: Obstetrics & Gynecology

## 2018-05-21 DIAGNOSIS — Z9889 Other specified postprocedural states: Secondary | ICD-10-CM

## 2018-06-13 ENCOUNTER — Ambulatory Visit
Admission: RE | Admit: 2018-06-13 | Discharge: 2018-06-13 | Disposition: A | Discharge status: Home / Self Care | Payer: BLUE CROSS/BLUE SHIELD | Source: Ambulatory Visit | Attending: Obstetrics & Gynecology | Admitting: Obstetrics & Gynecology

## 2018-06-13 DIAGNOSIS — Z9889 Other specified postprocedural states: Secondary | ICD-10-CM

## 2018-08-05 ENCOUNTER — Encounter: Payer: Self-pay | Admitting: Gastroenterology

## 2018-08-28 ENCOUNTER — Ambulatory Visit (INDEPENDENT_AMBULATORY_CARE_PROVIDER_SITE_OTHER): Payer: BLUE CROSS/BLUE SHIELD | Admitting: Obstetrics & Gynecology

## 2018-08-28 ENCOUNTER — Encounter: Payer: Self-pay | Admitting: Obstetrics & Gynecology

## 2018-08-28 VITALS — BP 136/78 | Ht 60.25 in | Wt 171.0 lb

## 2018-08-28 DIAGNOSIS — Z90710 Acquired absence of both cervix and uterus: Secondary | ICD-10-CM | POA: Diagnosis not present

## 2018-08-28 DIAGNOSIS — C50112 Malignant neoplasm of central portion of left female breast: Secondary | ICD-10-CM | POA: Diagnosis not present

## 2018-08-28 DIAGNOSIS — Z780 Asymptomatic menopausal state: Secondary | ICD-10-CM

## 2018-08-28 DIAGNOSIS — Z01419 Encounter for gynecological examination (general) (routine) without abnormal findings: Secondary | ICD-10-CM | POA: Diagnosis not present

## 2018-08-28 DIAGNOSIS — M8589 Other specified disorders of bone density and structure, multiple sites: Secondary | ICD-10-CM

## 2018-08-28 DIAGNOSIS — N644 Mastodynia: Secondary | ICD-10-CM | POA: Diagnosis not present

## 2018-08-28 DIAGNOSIS — N393 Stress incontinence (female) (male): Secondary | ICD-10-CM | POA: Diagnosis not present

## 2018-08-28 NOTE — Progress Notes (Signed)
PHILA SHOAF 03-31-1956 [id number]   History:    62 y.o. G2P2L2  Divorced  RP:  Established patient presenting for annual gyn exam   HPI: Postmenopausal, well on no hormone replacement therapy.  Status post total hysterectomy.  Complains of mild stress urinary incontinence.  No pelvic pain.  Continued intermittent left breast discomfort and pain.  History of left DCIS in 2014 for which she had a left lumpectomy followed by radiation therapy.  Screening mammogram was benign in August 2019.  Body mass index 33.12.  Health labs with family physician.  Past medical history,surgical history, family history and social history were all reviewed and documented in the EPIC chart.  Gynecologic History No LMP recorded. Patient has had a hysterectomy. Contraception: status post hysterectomy Last Pap: 11/2016. Results were: Negative Last mammogram: 06/2018. Results were: Benign Bone Density: 06/2015 Osteopenia Colonoscopy: 2007  Obstetric History OB History  Gravida Para Term Preterm AB Living  2 2 2     2   SAB TAB Ectopic Multiple Live Births          2    # Outcome Date GA Lbr Len/2nd Weight Sex Delivery Anes PTL Lv  2 Term     M Vag-Spont  N LIV  1 Term     M Vag-Spont  N LIV     ROS: A ROS was performed and pertinent positives and negatives are included in the history.  GENERAL: No fevers or chills. HEENT: No change in vision, no earache, sore throat or sinus congestion. NECK: No pain or stiffness. CARDIOVASCULAR: No chest pain or pressure. No palpitations. PULMONARY: No shortness of breath, cough or wheeze. GASTROINTESTINAL: No abdominal pain, nausea, vomiting or diarrhea, melena or bright red blood per rectum. GENITOURINARY: No urinary frequency, urgency, hesitancy or dysuria. MUSCULOSKELETAL: No joint or muscle pain, no back pain, no recent trauma. DERMATOLOGIC: No rash, no itching, no lesions. ENDOCRINE: No polyuria, polydipsia, no heat or cold intolerance. No recent change in weight.  HEMATOLOGICAL: No anemia or easy bruising or bleeding. NEUROLOGIC: No headache, seizures, numbness, tingling or weakness. PSYCHIATRIC: No depression, no loss of interest in normal activity or change in sleep pattern.     Exam:   BP 136/78   Ht 5' 0.25" (1.53 m)   Wt 171 lb (77.6 kg)   BMI 33.12 kg/m   Body mass index is 33.12 kg/m.  General appearance : Well developed well nourished female. No acute distress HEENT: Eyes: no retinal hemorrhage or exudates,  Neck supple, trachea midline, no carotid bruits, no thyroidmegaly Lungs: Clear to auscultation, no rhonchi or wheezes, or rib retractions  Heart: Regular rate and rhythm, no murmurs or gallops Breast:Examined in sitting and supine position were symmetrical in appearance, no palpable masses or tenderness,  no skin retraction, no nipple inversion, no nipple discharge, no skin discoloration, no axillary or supraclavicular lymphadenopathy Abdomen: no palpable masses or tenderness, no rebound or guarding Extremities: no edema or skin discoloration or tenderness  Pelvic: Vulva: Normal             Vagina: No gross lesions or discharge.  Cystocele grade 2/4 in standing position with valsalva.  Rectocele grade 1/3 in standing position with valsalva.  Cervix/Uterus absent  Adnexa  Without masses or tenderness  Anus: Normal   Assessment/Plan:  62 y.o. female for annual exam  1. Well female exam with routine gynecological exam Gynecologic exam status post total hysterectomy with cystoscopy rectocele and menopause.  Last Pap test January 2018  was negative.  No need to repeat this year.  Breast exam normal.  Screening mammogram was benign in August 2019.  Needs to schedule colonoscopy.  Health labs with family physician.  2. S/P total hysterectomy  3. Post-menopausal Menopause, well on no hormone replacement therapy.  4. Malignant neoplasm of central portion of left female breast, unspecified estrogen receptor status ([place]) History of  left DCIS in 2014.  Left breast biopsy was negative in August 2018.  Recent screening mammogram negative in August 2019.  5. Breast pain, left History of left DCIS in 2014 with lumpectomy and radiation therapy.  Left breast biopsy was negative in August 2018.  Recent screening mammogram negative in August 2019.  Will complete investigation with a left breast MRI.  6. SUI (stress urinary incontinence, female) Stress urinary incontinence.  Patient has a grade 2/4 cystocele and a grade 1/3 rectocele.  Avoid pelvic floor pressure by emptying the bladder before it is too full, treating cough, treating constipation and avoiding heavy lifting.  Kegel exercises recommended and instructed.    7. Osteopenia of multiple sites Vitamin D supplements, calcium intake of 1.5 g/day and regular weightbearing physical activity recommended.  Schedule bone density here now. - DG Bone Density; Future    Counseling on above issues and coordination of care more than 50% for 10 minutes.  Princess Bruins MD, 9:03 AM 08/28/2018

## 2018-09-01 ENCOUNTER — Encounter: Payer: Self-pay | Admitting: Obstetrics & Gynecology

## 2018-09-01 NOTE — Patient Instructions (Signed)
1. Well female exam with routine gynecological exam Gynecologic exam status post total hysterectomy with cystoscopy rectocele and menopause.  Last Pap test January 2018 was negative.  No need to repeat this year.  Breast exam normal.  Screening mammogram was benign in August 2019.  Needs to schedule colonoscopy.  Health labs with family physician.  2. S/P total hysterectomy  3. Post-menopausal Menopause, well on no hormone replacement therapy.  4. Malignant neoplasm of central portion of left female breast, unspecified estrogen receptor status ([place]) History of left DCIS in 2014.  Left breast biopsy was negative in August 2018.  Recent screening mammogram negative in August 2019.  5. Breast pain, left History of left DCIS in 2014 with lumpectomy and radiation therapy.  Left breast biopsy was negative in August 2018.  Recent screening mammogram negative in August 2019.  Will complete investigation with a left breast MRI.  6. SUI (stress urinary incontinence, female) Stress urinary incontinence.  Patient has a grade 2/4 cystocele and a grade 1/3 rectocele.  Avoid pelvic floor pressure by emptying the bladder before it is too full, treating cough, treating constipation and avoiding heavy lifting.  Kegel exercises recommended and instructed.    7. Osteopenia of multiple sites Vitamin D supplements, calcium intake of 1.5 g/day and regular weightbearing physical activity recommended.  Schedule bone density here now. - DG Bone Density; Future   Cynthia Ferguson, it was a pleasure seeing you today!

## 2018-09-02 ENCOUNTER — Telehealth: Payer: Self-pay | Admitting: *Deleted

## 2018-09-02 DIAGNOSIS — Z853 Personal history of malignant neoplasm of breast: Secondary | ICD-10-CM

## 2018-09-02 DIAGNOSIS — D0512 Intraductal carcinoma in situ of left breast: Secondary | ICD-10-CM

## 2018-09-02 DIAGNOSIS — N644 Mastodynia: Secondary | ICD-10-CM

## 2018-09-02 NOTE — Telephone Encounter (Signed)
Left detailed message on home # to call [facility] to call and schedule MRI, order placed.

## 2018-09-02 NOTE — Telephone Encounter (Signed)
-----   Message from Princess Bruins, MD sent at 08/28/2018  9:23 AM EDT ----- Regarding: Left breast pain for MRI of left breast Left breast pain post Lt breast Ca/radiation therapy

## 2018-09-10 ENCOUNTER — Encounter: Payer: Self-pay | Admitting: Family Medicine

## 2018-09-11 ENCOUNTER — Telehealth: Payer: Self-pay

## 2018-09-11 NOTE — Telephone Encounter (Signed)
I contact [employer] for PA on Breast MRI [id number] and answered a series of questions and was told this did not meet medical criteria. They offered have MD call to speak with reviewer or withdraw case as options. I forwarded a staff message to Dr. Marguerita Merles providing info and requesting she call them.

## 2018-09-11 NOTE — Telephone Encounter (Signed)
Patient scheduled on 09/17/18 @ 4:00pm at [facility] for MRI breast w/wo contrast

## 2018-09-12 ENCOUNTER — Encounter: Payer: BLUE CROSS/BLUE SHIELD | Admitting: Gastroenterology

## 2018-09-12 NOTE — Telephone Encounter (Signed)
Dr. Marguerita Merles called and spoke with Dr. Amie Critchley and received authorization for the MRI. [id number]. Note added to appointment notes for MRI.

## 2018-09-13 ENCOUNTER — Encounter: Payer: Self-pay | Admitting: Anesthesiology

## 2018-09-17 ENCOUNTER — Ambulatory Visit
Admission: RE | Admit: 2018-09-17 | Discharge: 2018-09-17 | Disposition: A | Discharge status: Home / Self Care | Payer: BLUE CROSS/BLUE SHIELD | Source: Ambulatory Visit | Attending: Obstetrics & Gynecology | Admitting: Obstetrics & Gynecology

## 2018-09-17 DIAGNOSIS — N644 Mastodynia: Secondary | ICD-10-CM

## 2018-09-17 DIAGNOSIS — D0512 Intraductal carcinoma in situ of left breast: Secondary | ICD-10-CM

## 2018-09-17 DIAGNOSIS — Z853 Personal history of malignant neoplasm of breast: Secondary | ICD-10-CM

## 2018-09-17 MED ORDER — GADOBUTROL 1 MMOL/ML IV SOLN
8.0000 mL | Freq: Once | INTRAVENOUS | Status: AC | PRN
  Administered 2018-09-17: 8 mL via INTRAVENOUS

## 2018-09-19 ENCOUNTER — Telehealth: Payer: Self-pay

## 2018-09-19 DIAGNOSIS — D0512 Intraductal carcinoma in situ of left breast: Secondary | ICD-10-CM

## 2018-09-19 NOTE — Telephone Encounter (Signed)
Patient had breast MRI 09/17/18 and radiologist has recommended 2 MRI guided biopsies of the Left Breast.   The radiologist does not inform patient regarding MRI result/recommendation. The provider who ordered (Dr. Marguerita Merles) is responsible for letting patient know.  Once she is informed we will place order and [facility] will schedule.

## 2018-09-19 NOTE — Telephone Encounter (Signed)
Opened in error

## 2018-09-19 NOTE — Telephone Encounter (Signed)
Left message for patient to call.

## 2018-09-19 NOTE — Telephone Encounter (Signed)
Please inform patient of MRI of the Breast results 09/17/2018:   1. Regional non-mass enhancement within the upper-outer quadrant of the LEFT breast, extending along the lateral margin of the lumpectomy site, anterior to middle depth, corresponding to the area of clinical concern. Recommend MRI-guided biopsies of the 2 most confluent/intense sites of non-mass enhancement, with slice position numbers detailed above. 2. No evidence of malignancy within the RIGHT breast.  RECOMMENDATION: MRI-guided biopsies of the 2 most confluent/intense sites of non-mass enhancement along the lateral margin of the lumpectomy site within the LEFT breast, with slice position numbers detailed above.  Needs 2 MRI guided Left Breast Biopsies.

## 2018-09-20 ENCOUNTER — Other Ambulatory Visit: Payer: Self-pay | Admitting: Obstetrics & Gynecology

## 2018-09-20 DIAGNOSIS — D0512 Intraductal carcinoma in situ of left breast: Secondary | ICD-10-CM

## 2018-09-20 NOTE — Telephone Encounter (Signed)
Order placed at [facility] they will call patient to schedule. 

## 2018-09-20 NOTE — Telephone Encounter (Signed)
Patient informed.  Need to place order and let scheduler Johnston Ebbs or Donette Larry know to call her at work  [phone] ext [phone].

## 2018-09-20 NOTE — Telephone Encounter (Signed)
Work # placed in order so they will call patient at work.

## 2018-09-20 NOTE — Telephone Encounter (Signed)
I called and spoke with Cynthia Ferguson and let her know that we have spoken with patient about result and order is in. She said she would go ahead and call her now.

## 2018-09-23 NOTE — Telephone Encounter (Signed)
Patient schedule on 09/26/18 @ 7:10am

## 2018-09-26 ENCOUNTER — Other Ambulatory Visit: Payer: Self-pay | Admitting: Obstetrics & Gynecology

## 2018-09-26 ENCOUNTER — Ambulatory Visit
Admission: RE | Admit: 2018-09-26 | Discharge: 2018-09-26 | Disposition: A | Discharge status: Home / Self Care | Payer: BLUE CROSS/BLUE SHIELD | Source: Ambulatory Visit | Attending: Obstetrics & Gynecology | Admitting: Obstetrics & Gynecology

## 2018-09-26 DIAGNOSIS — D0512 Intraductal carcinoma in situ of left breast: Secondary | ICD-10-CM

## 2018-09-26 MED ORDER — GADOBUTROL 1 MMOL/ML IV SOLN
8.0000 mL | Freq: Once | INTRAVENOUS | Status: AC | PRN
  Administered 2018-09-26: 8 mL via INTRAVENOUS

## 2018-09-30 ENCOUNTER — Other Ambulatory Visit: Payer: Self-pay | Admitting: Student

## 2018-09-30 DIAGNOSIS — M5412 Radiculopathy, cervical region: Secondary | ICD-10-CM

## 2018-10-13 ENCOUNTER — Ambulatory Visit
Admission: RE | Admit: 2018-10-13 | Discharge: 2018-10-13 | Disposition: A | Discharge status: Home / Self Care | Payer: BLUE CROSS/BLUE SHIELD | Source: Ambulatory Visit | Attending: Student | Admitting: Student

## 2018-10-13 DIAGNOSIS — M5412 Radiculopathy, cervical region: Secondary | ICD-10-CM

## 2018-10-16 ENCOUNTER — Other Ambulatory Visit: Payer: Self-pay | Admitting: Neurosurgery

## 2018-11-08 NOTE — Pre-Procedure Instructions (Signed)
ZIERRA LAROQUE  11/08/2018      [facility] - [place], [place] - [address] [place] [address] Phone: [phone] Fax: [phone]  [facility] [place], [place] [address] [place] [address] Phone: [phone] Fax: [phone]    Your procedure is scheduled on 11/15/2018.  Report to [facility] Admitting at 10:40 A.M.  Call this number if you have problems the morning of surgery:  [phone]   Remember:  Do not eat or drink after midnight.    Take these medicines the morning of surgery with A SIP OF WATER: Acetaminophen (Tylenol) - if needed Alprazolam (xanax) Gabapentin (neurontin) - if needed Omeprazole (prilosec) Propylene Glycol (systane balance) eye drop - if needed  7 days prior to surgery STOP taking any diclofenac (voltaren), excedrin migraine, Aspirin (unless otherwise instructed by your surgeon), Aleve, Naproxen, Ibuprofen, Motrin, Advil, Goody's, BC's, all herbal medications, fish oil, and all vitamins.     Do not wear jewelry, make-up or nail polish.  Do not wear lotions, powders, or perfumes, or deodorant.  Do not shave 48 hours prior to surgery.    Do not bring valuables to the hospital.  [facility] is not responsible for any belongings or valuables.  Contacts, eyeglasses, hearing aids, dentures or bridgework may not be worn into surgery.  Leave your suitcase in the car.  After surgery it may be brought to your room.  For patients admitted to the hospital, discharge time will be determined by your treatment team.  Patients discharged the day of surgery will not be allowed to drive home.   Name and phone number of your driver:    Special instructions:   [place]- Preparing For Surgery  Before surgery, you can play an important role. Because skin is not sterile, your skin needs to be as free of germs as possible. You can reduce the number of germs on  your skin by washing with CHG (chlorahexidine gluconate) Soap before surgery.  CHG is an antiseptic cleaner which kills germs and bonds with the skin to continue killing germs even after washing.    Oral Hygiene is also important to reduce your risk of infection.  Remember - BRUSH YOUR TEETH THE MORNING OF SURGERY WITH YOUR REGULAR TOOTHPASTE  Please do not use if you have an allergy to CHG or antibacterial soaps. If your skin becomes reddened/irritated stop using the CHG.  Do not shave (including legs and underarms) for at least 48 hours prior to first CHG shower. It is OK to shave your face.  Please follow these instructions carefully.   1. Shower the NIGHT BEFORE SURGERY and the MORNING OF SURGERY with CHG.   2. If you chose to wash your hair, wash your hair first as usual with your normal shampoo.  3. After you shampoo, rinse your hair and body thoroughly to remove the shampoo.  4. Use CHG as you would any other liquid soap. You can apply CHG directly to the skin and wash gently with a scrungie or a clean washcloth.   5. Apply the CHG Soap to your body ONLY FROM THE NECK DOWN.  Do not use on open wounds or open sores. Avoid contact with your eyes, ears, mouth and genitals (private parts). Wash Face and genitals (private parts)  with your normal soap.  6. Wash thoroughly, paying special attention to the area where your surgery will  be performed.  7. Thoroughly rinse your body with warm water from the neck down.  8. DO NOT shower/wash with your normal soap after using and rinsing off the CHG Soap.  9. Pat yourself dry with a CLEAN TOWEL.  10. Wear CLEAN PAJAMAS to bed the night before surgery, wear comfortable clothes the morning of surgery  11. Place CLEAN SHEETS on your bed the night of your first shower and DO NOT SLEEP WITH PETS.    Day of Surgery: Shower as stated above Do not apply any deodorants/lotions.  Please wear clean clothes to the hospital/surgery center.    Remember to brush your teeth WITH YOUR REGULAR TOOTHPASTE.    Please read over the following fact sheets that you were given.

## 2018-11-11 ENCOUNTER — Other Ambulatory Visit: Payer: Self-pay

## 2018-11-11 ENCOUNTER — Encounter (HOSPITAL_COMMUNITY)
Admission: RE | Admit: 2018-11-11 | Discharge: 2018-11-11 | Disposition: A | Discharge status: Home / Self Care | Payer: BLUE CROSS/BLUE SHIELD | Source: Ambulatory Visit | Attending: Neurosurgery | Admitting: Neurosurgery

## 2018-11-11 ENCOUNTER — Encounter (HOSPITAL_COMMUNITY): Payer: Self-pay

## 2018-11-11 DIAGNOSIS — Z01812 Encounter for preprocedural laboratory examination: Secondary | ICD-10-CM | POA: Diagnosis not present

## 2018-11-11 LAB — BASIC METABOLIC PANEL
Anion gap: 9 (ref 5–15)
BUN: 13 mg/dL (ref 8–23)
CHLORIDE: 103 mmol/L (ref 98–111)
CO2: 26 mmol/L (ref 22–32)
CREATININE: 0.93 mg/dL (ref 0.44–1.00)
Calcium: 9.6 mg/dL (ref 8.9–10.3)
GFR calc Af Amer: >60 mL/min (ref >60)
GFR calc non Af Amer: >60 mL/min (ref >60)
Glucose, Bld: 120 mg/dL — ABNORMAL HIGH (ref 70–99)
Potassium: 3.7 mmol/L (ref 3.5–5.1)
SODIUM: 138 mmol/L (ref 135–145)

## 2018-11-11 LAB — CBC WITH DIFFERENTIAL/PLATELET
ABS IMMATURE GRANULOCYTES: 0.02 10*3/uL (ref 0.00–0.07)
BASOS ABS: 0 10*3/uL (ref 0.0–0.1)
BASOS PCT: 1 %
Eosinophils Absolute: 0 10*3/uL (ref 0.0–0.5)
Eosinophils Relative: 0 %
HCT: 37.9 % (ref 36.0–46.0)
HEMOGLOBIN: 12.7 g/dL (ref 12.0–15.0)
Immature Granulocytes: 0 %
LYMPHS ABS: 2.2 10*3/uL (ref 0.7–4.0)
LYMPHS PCT: 25 %
MCH: 31.8 pg (ref 26.0–34.0)
MCHC: 33.5 g/dL (ref 30.0–36.0)
MCV: 95 fL (ref 80.0–100.0)
MONO ABS: 0.6 10*3/uL (ref 0.1–1.0)
Monocytes Relative: 7 %
Neutro Abs: 6 10*3/uL (ref 1.7–7.7)
Neutrophils Relative %: 67 %
PLATELETS: 307 10*3/uL (ref 150–400)
RBC: 3.99 MIL/uL (ref 3.87–5.11)
RDW: 12.3 % (ref 11.5–15.5)
WBC: 8.8 10*3/uL (ref 4.0–10.5)
nRBC: 0 % (ref 0.0–0.2)

## 2018-11-11 LAB — TYPE AND SCREEN
ABO/RH(D): A POS
ANTIBODY SCREEN: NEGATIVE

## 2018-11-11 LAB — SURGICAL PCR SCREEN
MRSA, PCR: NEGATIVE
Staphylococcus aureus: NEGATIVE

## 2018-11-11 NOTE — Pre-Procedure Instructions (Signed)
Cynthia Ferguson  11/11/2018       [facility] [place], [place] - [address] [place] [address] Phone: [phone] Fax: [phone]  [facility] - [place], [place] [address] [place] [address] Phone: [phone] Fax: [phone]    Your procedure is scheduled on, Friday, January 10th   Report to [facility] Admitting at 10:40 A.M.             (posted surgery time 12:40p - 3:20p)   Call this number if you have problems the morning of surgery:  [phone]   Remember:  Do not eat any foods or drink any liquids after midnight, Thursday.    Take these medicines the morning of surgery with A SIP OF WATER: Acetaminophen (Tylenol) - if needed Alprazolam (xanax) Gabapentin (neurontin) - if needed Omeprazole (prilosec) Propylene Glycol (systane balance) eye drop - if needed  7 days prior to surgery STOP taking any diclofenac (voltaren), excedrin migraine, Aspirin (unless otherwise instructed by your surgeon), Aleve, Naproxen, Ibuprofen, Motrin, Advil, Goody's, BC's, all herbal medications, fish oil, and all vitamins.     Do not wear jewelry, make-up or nail polish.  Do not wear lotions, powders,  perfumes, or deodorant.  Do not shave 48 hours prior to surgery.    Do not bring valuables to the hospital.  [facility] is not responsible for any belongings or valuables.  Contacts, eyeglasses, hearing aids, dentures or bridgework may not be worn into surgery.  Leave your suitcase in the car.  After surgery it may be brought to your room.  For patients admitted to the hospital, discharge time will be determined by your treatment team.       Special instructions:   [place]- Preparing For Surgery  Before surgery, you can play an important role. Because skin is not sterile, your skin needs to be as free of germs as possible. You can reduce the number of germs on your skin by washing  with CHG (chlorahexidine gluconate) Soap before surgery.  CHG is an antiseptic cleaner which kills germs and bonds with the skin to continue killing germs even after washing.    Oral Hygiene is also important to reduce your risk of infection.    Remember - BRUSH YOUR TEETH THE MORNING OF SURGERY WITH YOUR REGULAR TOOTHPASTE  Please do not use if you have an allergy to CHG or antibacterial soaps. If your skin becomes reddened/irritated stop using the CHG.  Do not shave (including legs and underarms) for at least 48 hours prior to first CHG shower. It is OK to shave your face.  Please follow these instructions carefully.   1. Shower the NIGHT BEFORE SURGERY and the MORNING OF SURGERY with CHG.   2. If you chose to wash your hair, wash your hair first as usual with your normal shampoo.  3. After you shampoo, rinse your hair and body thoroughly to remove the shampoo.  4. Use CHG as you would any other liquid soap. You can apply CHG directly to the skin and wash gently with a scrungie or a clean washcloth.   5. Apply the CHG Soap to your body ONLY FROM THE NECK DOWN.  Do not use on open wounds or open sores. Avoid contact with your eyes, ears, mouth and genitals (private parts). Wash Face and genitals (private parts)  with your normal soap.  6. Wash thoroughly, paying  special attention to the area where your surgery will be performed.  7. Thoroughly rinse your body with warm water from the neck down.  8. DO NOT shower/wash with your normal soap after using and rinsing off the CHG Soap.  9. Pat yourself dry with a CLEAN TOWEL.  10. Wear CLEAN PAJAMAS to bed the night before surgery, wear comfortable clothes the morning of surgery  11. Place CLEAN SHEETS on your bed the night of your first shower and DO NOT SLEEP WITH PETS.  Day of Surgery: Shower as stated above Do not apply any deodorants/lotions.  Please wear clean clothes to the hospital/surgery center.   Remember to brush your  teeth WITH YOUR REGULAR TOOTHPASTE.    Please read over the following fact sheets that you were given.

## 2018-11-11 NOTE — Progress Notes (Signed)
PCP is Dr. Talbert Forest Corrington  LOV 08/2018 Denies murmur, cp, sob. Denies any cardiac testing

## 2018-11-15 ENCOUNTER — Inpatient Hospital Stay (HOSPITAL_COMMUNITY): Payer: BLUE CROSS/BLUE SHIELD | Admitting: Certified Registered Nurse Anesthetist

## 2018-11-15 ENCOUNTER — Inpatient Hospital Stay (HOSPITAL_COMMUNITY)
Admission: RE | Admit: 2018-11-15 | Discharge: 2018-11-16 | DRG: 472 | Disposition: A | Discharge status: Home / Self Care | Payer: BLUE CROSS/BLUE SHIELD | Attending: Neurosurgery | Admitting: Neurosurgery

## 2018-11-15 ENCOUNTER — Encounter (HOSPITAL_COMMUNITY)
Admission: RE | Disposition: A | Discharge status: Home / Self Care | Payer: Self-pay | Source: Home / Self Care | Attending: Neurosurgery

## 2018-11-15 ENCOUNTER — Inpatient Hospital Stay (HOSPITAL_COMMUNITY): Payer: BLUE CROSS/BLUE SHIELD

## 2018-11-15 ENCOUNTER — Other Ambulatory Visit: Payer: Self-pay

## 2018-11-15 ENCOUNTER — Encounter (HOSPITAL_COMMUNITY): Payer: Self-pay | Admitting: *Deleted

## 2018-11-15 DIAGNOSIS — Z853 Personal history of malignant neoplasm of breast: Secondary | ICD-10-CM | POA: Diagnosis not present

## 2018-11-15 DIAGNOSIS — M4802 Spinal stenosis, cervical region: Principal | ICD-10-CM | POA: Diagnosis present

## 2018-11-15 DIAGNOSIS — E7800 Pure hypercholesterolemia, unspecified: Secondary | ICD-10-CM | POA: Diagnosis present

## 2018-11-15 DIAGNOSIS — Z803 Family history of malignant neoplasm of breast: Secondary | ICD-10-CM

## 2018-11-15 DIAGNOSIS — Z79899 Other long term (current) drug therapy: Secondary | ICD-10-CM | POA: Diagnosis not present

## 2018-11-15 DIAGNOSIS — M542 Cervicalgia: Secondary | ICD-10-CM | POA: Diagnosis present

## 2018-11-15 DIAGNOSIS — M1990 Unspecified osteoarthritis, unspecified site: Secondary | ICD-10-CM | POA: Diagnosis present

## 2018-11-15 DIAGNOSIS — F419 Anxiety disorder, unspecified: Secondary | ICD-10-CM | POA: Diagnosis present

## 2018-11-15 DIAGNOSIS — Z8249 Family history of ischemic heart disease and other diseases of the circulatory system: Secondary | ICD-10-CM | POA: Diagnosis not present

## 2018-11-15 DIAGNOSIS — I10 Essential (primary) hypertension: Secondary | ICD-10-CM | POA: Diagnosis present

## 2018-11-15 DIAGNOSIS — Z419 Encounter for procedure for purposes other than remedying health state, unspecified: Secondary | ICD-10-CM

## 2018-11-15 DIAGNOSIS — K219 Gastro-esophageal reflux disease without esophagitis: Secondary | ICD-10-CM | POA: Diagnosis present

## 2018-11-15 DIAGNOSIS — Z888 Allergy status to other drugs, medicaments and biological substances status: Secondary | ICD-10-CM

## 2018-11-15 DIAGNOSIS — Z981 Arthrodesis status: Secondary | ICD-10-CM | POA: Diagnosis not present

## 2018-11-15 DIAGNOSIS — G959 Disease of spinal cord, unspecified: Secondary | ICD-10-CM | POA: Diagnosis present

## 2018-11-15 DIAGNOSIS — Z923 Personal history of irradiation: Secondary | ICD-10-CM

## 2018-11-15 DIAGNOSIS — M4712 Other spondylosis with myelopathy, cervical region: Secondary | ICD-10-CM | POA: Diagnosis present

## 2018-11-15 DIAGNOSIS — Z7982 Long term (current) use of aspirin: Secondary | ICD-10-CM

## 2018-11-15 HISTORY — PX: ANTERIOR CERVICAL DECOMP/DISCECTOMY FUSION: SHX1161

## 2018-11-15 SURGERY — ANTERIOR CERVICAL DECOMPRESSION/DISCECTOMY FUSION 3 LEVELS
Anesthesia: General

## 2018-11-15 MED ORDER — NITROFURANTOIN MACROCRYSTAL 100 MG PO CAPS
100.0000 mg | ORAL_CAPSULE | Freq: Every day | ORAL | Status: DC
  Administered 2018-11-15: 100 mg via ORAL
  Filled 2018-11-15: qty 1

## 2018-11-15 MED ORDER — ACETAMINOPHEN 650 MG RE SUPP: 650.0000 mg | RECTAL | Status: DC | PRN

## 2018-11-15 MED ORDER — 0.9 % SODIUM CHLORIDE (POUR BTL) OPTIME
TOPICAL | Status: DC | PRN
  Administered 2018-11-15: 1000 mL

## 2018-11-15 MED ORDER — THROMBIN 5000 UNITS EX SOLR
OROMUCOSAL | Status: DC | PRN
  Administered 2018-11-15: 13:00:00 via TOPICAL

## 2018-11-15 MED ORDER — MENTHOL 3 MG MT LOZG
1.0000 | LOZENGE | OROMUCOSAL | Status: DC | PRN
  Filled 2018-11-15: qty 9

## 2018-11-15 MED ORDER — MIDAZOLAM HCL 2 MG/2ML IJ SOLN
INTRAMUSCULAR | Status: AC
  Filled 2018-11-15: qty 2

## 2018-11-15 MED ORDER — ONDANSETRON HCL 4 MG PO TABS: 4.0000 mg | ORAL_TABLET | Freq: Four times a day (QID) | ORAL | Status: DC | PRN

## 2018-11-15 MED ORDER — PANTOPRAZOLE SODIUM 40 MG PO TBEC: 80.0000 mg | DELAYED_RELEASE_TABLET | Freq: Every day | ORAL | Status: DC

## 2018-11-15 MED ORDER — LACTATED RINGERS IV SOLN
INTRAVENOUS | Status: DC
  Administered 2018-11-15 (×2): via INTRAVENOUS

## 2018-11-15 MED ORDER — VANCOMYCIN HCL IN DEXTROSE 1-5 GM/200ML-% IV SOLN
INTRAVENOUS | Status: AC
  Administered 2018-11-15: 1000 mg via INTRAVENOUS
  Filled 2018-11-15: qty 200

## 2018-11-15 MED ORDER — THROMBIN 20000 UNITS EX SOLR
CUTANEOUS | Status: AC
  Filled 2018-11-15: qty 20000

## 2018-11-15 MED ORDER — POLYVINYL ALCOHOL 1.4 % OP SOLN
1.0000 [drp] | Freq: Three times a day (TID) | OPHTHALMIC | Status: DC | PRN
  Filled 2018-11-15: qty 15

## 2018-11-15 MED ORDER — LOSARTAN POTASSIUM 50 MG PO TABS
100.0000 mg | ORAL_TABLET | Freq: Every day | ORAL | Status: DC
  Administered 2018-11-15: 100 mg via ORAL
  Filled 2018-11-15: qty 2

## 2018-11-15 MED ORDER — HYDROMORPHONE HCL 1 MG/ML IJ SOLN
INTRAMUSCULAR | Status: AC
  Filled 2018-11-15: qty 1

## 2018-11-15 MED ORDER — HYDROCODONE-ACETAMINOPHEN 10-325 MG PO TABS
2.0000 | ORAL_TABLET | ORAL | Status: DC | PRN
  Administered 2018-11-15 – 2018-11-16 (×3): 2 via ORAL
  Filled 2018-11-15 (×3): qty 2

## 2018-11-15 MED ORDER — PHENOL 1.4 % MT LIQD: 1.0000 | OROMUCOSAL | Status: DC | PRN

## 2018-11-15 MED ORDER — DICLOFENAC SODIUM 25 MG PO TBEC
50.0000 mg | DELAYED_RELEASE_TABLET | Freq: Two times a day (BID) | ORAL | Status: DC
  Administered 2018-11-15: 50 mg via ORAL
  Filled 2018-11-15: qty 2
  Filled 2018-11-15 (×2): qty 1

## 2018-11-15 MED ORDER — CYCLOBENZAPRINE HCL 10 MG PO TABS
ORAL_TABLET | ORAL | Status: AC
  Filled 2018-11-15: qty 1

## 2018-11-15 MED ORDER — SODIUM CHLORIDE 0.9% FLUSH: 3.0000 mL | Freq: Two times a day (BID) | INTRAVENOUS | Status: DC

## 2018-11-15 MED ORDER — FENTANYL CITRATE (PF) 250 MCG/5ML IJ SOLN
INTRAMUSCULAR | Status: AC
  Filled 2018-11-15: qty 5

## 2018-11-15 MED ORDER — POLYETHYLENE GLYCOL 3350 17 G PO PACK: 17.0000 g | PACK | Freq: Every day | ORAL | Status: DC | PRN

## 2018-11-15 MED ORDER — SIMVASTATIN 20 MG PO TABS
40.0000 mg | ORAL_TABLET | Freq: Every day | ORAL | Status: DC
  Administered 2018-11-15: 40 mg via ORAL
  Filled 2018-11-15: qty 2

## 2018-11-15 MED ORDER — CHLORHEXIDINE GLUCONATE CLOTH 2 % EX PADS: 6.0000 | MEDICATED_PAD | Freq: Once | CUTANEOUS | Status: DC

## 2018-11-15 MED ORDER — LIDOCAINE 2% (20 MG/ML) 5 ML SYRINGE
INTRAMUSCULAR | Status: AC
  Filled 2018-11-15: qty 5

## 2018-11-15 MED ORDER — ONDANSETRON HCL 4 MG/2ML IJ SOLN
INTRAMUSCULAR | Status: AC
  Filled 2018-11-15: qty 2

## 2018-11-15 MED ORDER — PROMETHAZINE HCL 25 MG/ML IJ SOLN: 6.2500 mg | INTRAMUSCULAR | Status: DC | PRN

## 2018-11-15 MED ORDER — GABAPENTIN 300 MG PO CAPS: 300.0000 mg | ORAL_CAPSULE | Freq: Every day | ORAL | Status: DC | PRN

## 2018-11-15 MED ORDER — ROCURONIUM BROMIDE 10 MG/ML (PF) SYRINGE
PREFILLED_SYRINGE | INTRAVENOUS | Status: DC | PRN
  Administered 2018-11-15: 50 mg via INTRAVENOUS
  Administered 2018-11-15: 20 mg via INTRAVENOUS

## 2018-11-15 MED ORDER — LORATADINE 10 MG PO TABS
10.0000 mg | ORAL_TABLET | Freq: Every day | ORAL | Status: DC
  Administered 2018-11-15: 10 mg via ORAL
  Filled 2018-11-15: qty 1

## 2018-11-15 MED ORDER — CEFAZOLIN SODIUM-DEXTROSE 1-4 GM/50ML-% IV SOLN
1.0000 g | Freq: Three times a day (TID) | INTRAVENOUS | Status: AC
  Administered 2018-11-15 – 2018-11-16 (×2): 1 g via INTRAVENOUS
  Filled 2018-11-15 (×2): qty 50

## 2018-11-15 MED ORDER — PROPOFOL 10 MG/ML IV BOLUS
INTRAVENOUS | Status: DC | PRN
  Administered 2018-11-15: 200 mg via INTRAVENOUS

## 2018-11-15 MED ORDER — PHENYLEPHRINE HCL 10 MG/ML IJ SOLN
INTRAMUSCULAR | Status: DC | PRN
  Administered 2018-11-15: 80 ug via INTRAVENOUS

## 2018-11-15 MED ORDER — THROMBIN 20000 UNITS EX SOLR
CUTANEOUS | Status: DC | PRN
  Administered 2018-11-15: 13:00:00 via TOPICAL

## 2018-11-15 MED ORDER — DEXAMETHASONE SODIUM PHOSPHATE 10 MG/ML IJ SOLN
INTRAMUSCULAR | Status: DC | PRN
  Administered 2018-11-15: 10 mg via INTRAVENOUS

## 2018-11-15 MED ORDER — CYCLOBENZAPRINE HCL 10 MG PO TABS
10.0000 mg | ORAL_TABLET | Freq: Three times a day (TID) | ORAL | Status: DC | PRN
  Administered 2018-11-15 – 2018-11-16 (×2): 10 mg via ORAL
  Filled 2018-11-15: qty 1

## 2018-11-15 MED ORDER — HYDROCHLOROTHIAZIDE 25 MG PO TABS
25.0000 mg | ORAL_TABLET | Freq: Every day | ORAL | Status: DC
  Administered 2018-11-15: 25 mg via ORAL
  Filled 2018-11-15: qty 1

## 2018-11-15 MED ORDER — ADULT MULTIVITAMIN W/MINERALS CH
1.0000 | ORAL_TABLET | Freq: Every day | ORAL | Status: DC
  Administered 2018-11-15: 1 via ORAL
  Filled 2018-11-15: qty 1

## 2018-11-15 MED ORDER — ACETAMINOPHEN 325 MG PO TABS: 650.0000 mg | ORAL_TABLET | ORAL | Status: DC | PRN

## 2018-11-15 MED ORDER — ONDANSETRON HCL 4 MG/2ML IJ SOLN
INTRAMUSCULAR | Status: DC | PRN
  Administered 2018-11-15: 4 mg via INTRAVENOUS

## 2018-11-15 MED ORDER — MIDAZOLAM HCL 2 MG/2ML IJ SOLN
INTRAMUSCULAR | Status: DC | PRN
  Administered 2018-11-15 (×2): 1 mg via INTRAVENOUS

## 2018-11-15 MED ORDER — VANCOMYCIN HCL IN DEXTROSE 1-5 GM/200ML-% IV SOLN
1000.0000 mg | INTRAVENOUS | Status: AC
  Administered 2018-11-15: 1000 mg via INTRAVENOUS

## 2018-11-15 MED ORDER — ONDANSETRON HCL 4 MG/2ML IJ SOLN: 4.0000 mg | Freq: Four times a day (QID) | INTRAMUSCULAR | Status: DC | PRN

## 2018-11-15 MED ORDER — SODIUM CHLORIDE 0.9% FLUSH: 3.0000 mL | INTRAVENOUS | Status: DC | PRN

## 2018-11-15 MED ORDER — ROCURONIUM BROMIDE 50 MG/5ML IV SOSY
PREFILLED_SYRINGE | INTRAVENOUS | Status: AC
  Filled 2018-11-15: qty 5

## 2018-11-15 MED ORDER — SUGAMMADEX SODIUM 200 MG/2ML IV SOLN
INTRAVENOUS | Status: DC | PRN
  Administered 2018-11-15: 200 mg via INTRAVENOUS

## 2018-11-15 MED ORDER — PROPOFOL 10 MG/ML IV BOLUS
INTRAVENOUS | Status: AC
  Filled 2018-11-15: qty 20

## 2018-11-15 MED ORDER — HYDROMORPHONE HCL 1 MG/ML IJ SOLN
0.2500 mg | INTRAMUSCULAR | Status: DC | PRN
  Administered 2018-11-15 (×2): 0.5 mg via INTRAVENOUS

## 2018-11-15 MED ORDER — PHENYLEPHRINE 40 MCG/ML (10ML) SYRINGE FOR IV PUSH (FOR BLOOD PRESSURE SUPPORT)
PREFILLED_SYRINGE | INTRAVENOUS | Status: AC
  Filled 2018-11-15: qty 10

## 2018-11-15 MED ORDER — LIDOCAINE 2% (20 MG/ML) 5 ML SYRINGE
INTRAMUSCULAR | Status: DC | PRN
  Administered 2018-11-15: 60 mg via INTRAVENOUS

## 2018-11-15 MED ORDER — DEXAMETHASONE SODIUM PHOSPHATE 10 MG/ML IJ SOLN
10.0000 mg | INTRAMUSCULAR | Status: DC
  Filled 2018-11-15: qty 1

## 2018-11-15 MED ORDER — SODIUM CHLORIDE 0.9 % IV SOLN: 250.0000 mL | INTRAVENOUS | Status: DC

## 2018-11-15 MED ORDER — THROMBIN 5000 UNITS EX SOLR
CUTANEOUS | Status: AC
  Filled 2018-11-15: qty 5000

## 2018-11-15 MED ORDER — DIPHENHYDRAMINE HCL 25 MG PO TABS
25.0000 mg | ORAL_TABLET | Freq: Three times a day (TID) | ORAL | Status: DC | PRN
  Filled 2018-11-15: qty 1

## 2018-11-15 MED ORDER — EPHEDRINE 5 MG/ML INJ
INTRAVENOUS | Status: AC
  Filled 2018-11-15: qty 10

## 2018-11-15 MED ORDER — SUCCINYLCHOLINE CHLORIDE 200 MG/10ML IV SOSY
PREFILLED_SYRINGE | INTRAVENOUS | Status: AC
  Filled 2018-11-15: qty 10

## 2018-11-15 MED ORDER — CALCIUM CARBONATE-VITAMIN D 500-200 MG-UNIT PO TABS
1.0000 | ORAL_TABLET | Freq: Two times a day (BID) | ORAL | Status: DC
  Administered 2018-11-15: 1 via ORAL
  Filled 2018-11-15: qty 1

## 2018-11-15 MED ORDER — SODIUM CHLORIDE 0.9 % IV SOLN
INTRAVENOUS | Status: DC | PRN
  Administered 2018-11-15: 13:00:00

## 2018-11-15 MED ORDER — HYDROCODONE-ACETAMINOPHEN 5-325 MG PO TABS: 1.0000 | ORAL_TABLET | ORAL | Status: DC | PRN

## 2018-11-15 MED ORDER — DIPHENHYDRAMINE HCL 25 MG PO CAPS: 25.0000 mg | ORAL_CAPSULE | Freq: Three times a day (TID) | ORAL | Status: DC | PRN

## 2018-11-15 MED ORDER — ALPRAZOLAM 0.25 MG PO TABS
0.5000 mg | ORAL_TABLET | Freq: Every day | ORAL | Status: DC
  Administered 2018-11-15: 0.5 mg via ORAL
  Filled 2018-11-15: qty 2

## 2018-11-15 MED ORDER — HYDROMORPHONE HCL 1 MG/ML IJ SOLN: 1.0000 mg | INTRAMUSCULAR | Status: DC | PRN

## 2018-11-15 MED ORDER — DEXAMETHASONE SODIUM PHOSPHATE 10 MG/ML IJ SOLN
INTRAMUSCULAR | Status: AC
  Filled 2018-11-15: qty 2

## 2018-11-15 MED ORDER — FENTANYL CITRATE (PF) 250 MCG/5ML IJ SOLN
INTRAMUSCULAR | Status: DC | PRN
  Administered 2018-11-15 (×5): 50 ug via INTRAVENOUS
  Administered 2018-11-15: 100 ug via INTRAVENOUS

## 2018-11-15 SURGICAL SUPPLY — 55 items
APL SKNCLS STERI-STRIP NONHPOA (GAUZE/BANDAGES/DRESSINGS) ×1
BAG DECANTER FOR FLEXI CONT (MISCELLANEOUS) ×2 IMPLANT
BENZOIN TINCTURE PRP APPL 2/3 (GAUZE/BANDAGES/DRESSINGS) ×2 IMPLANT
BIT DRILL 13 (BIT) ×1 IMPLANT
BUR MATCHSTICK NEURO 3.0 LAGG (BURR) ×2 IMPLANT
CAGE PEEK 6X14X11 (Cage) ×6 IMPLANT
CAGE SPNL 11X14X6XRADOPQ (Cage) IMPLANT
CANISTER SUCT 3000ML PPV (MISCELLANEOUS) ×2 IMPLANT
CARTRIDGE OIL MAESTRO DRILL (MISCELLANEOUS) ×1 IMPLANT
COVER WAND RF STERILE (DRAPES) ×1 IMPLANT
DIFFUSER DRILL AIR PNEUMATIC (MISCELLANEOUS) ×2 IMPLANT
DRAPE C-ARM 42X72 X-RAY (DRAPES) ×4 IMPLANT
DRAPE LAPAROTOMY 100X72 PEDS (DRAPES) ×2 IMPLANT
DRAPE MICROSCOPE LEICA (MISCELLANEOUS) ×2 IMPLANT
DURAPREP 6ML APPLICATOR 50/CS (WOUND CARE) ×2 IMPLANT
ELECT COATED BLADE 2.86 ST (ELECTRODE) ×2 IMPLANT
ELECT REM PT RETURN 9FT ADLT (ELECTROSURGICAL) ×2
ELECTRODE REM PT RTRN 9FT ADLT (ELECTROSURGICAL) ×1 IMPLANT
GAUZE 4X4 16PLY RFD (DISPOSABLE) IMPLANT
GAUZE SPONGE 4X4 12PLY STRL (GAUZE/BANDAGES/DRESSINGS) ×2 IMPLANT
GLOVE ECLIPSE 9.0 STRL (GLOVE) ×2 IMPLANT
GLOVE EXAM NITRILE XL STR (GLOVE) IMPLANT
GOWN STRL REUS W/ TWL LRG LVL3 (GOWN DISPOSABLE) IMPLANT
GOWN STRL REUS W/ TWL XL LVL3 (GOWN DISPOSABLE) IMPLANT
GOWN STRL REUS W/TWL 2XL LVL3 (GOWN DISPOSABLE) IMPLANT
GOWN STRL REUS W/TWL LRG LVL3 (GOWN DISPOSABLE)
GOWN STRL REUS W/TWL XL LVL3 (GOWN DISPOSABLE)
HALTER HD/CHIN CERV TRACTION D (MISCELLANEOUS) ×2 IMPLANT
HEMOSTAT POWDER KIT SURGIFOAM (HEMOSTASIS) ×2 IMPLANT
KIT BASIN OR (CUSTOM PROCEDURE TRAY) ×2 IMPLANT
KIT TURNOVER KIT B (KITS) ×2 IMPLANT
NDL SPNL 20GX3.5 QUINCKE YW (NEEDLE) ×1 IMPLANT
NEEDLE SPNL 20GX3.5 QUINCKE YW (NEEDLE) ×2 IMPLANT
NS IRRIG 1000ML POUR BTL (IV SOLUTION) ×2 IMPLANT
OIL CARTRIDGE MAESTRO DRILL (MISCELLANEOUS) ×2
PACK LAMINECTOMY NEURO (CUSTOM PROCEDURE TRAY) ×2 IMPLANT
PAD ARMBOARD 7.5X6 YLW CONV (MISCELLANEOUS) ×6 IMPLANT
PATTIES SURGICAL 1X1 (DISPOSABLE) ×1 IMPLANT
PLATE 3 57.5XLCK NS SPNE CVD (Plate) IMPLANT
PLATE 3 ATLANTIS TRANS (Plate) ×2 IMPLANT
RUBBERBAND STERILE (MISCELLANEOUS) ×4 IMPLANT
SCREW ST FIX 4 ATL 3120213 (Screw) ×8 IMPLANT
SPACER SPNL 11X14X6XPEEK CVD (Cage) IMPLANT
SPCR SPNL 11X14X6XPEEK CVD (Cage) ×2 IMPLANT
SPONGE INTESTINAL PEANUT (DISPOSABLE) ×2 IMPLANT
SPONGE SURGIFOAM ABS GEL 100 (HEMOSTASIS) ×2 IMPLANT
STRIP CLOSURE SKIN 1/2X4 (GAUZE/BANDAGES/DRESSINGS) ×2 IMPLANT
SUT VIC AB 3-0 SH 8-18 (SUTURE) ×2 IMPLANT
SUT VIC AB 4-0 RB1 18 (SUTURE) ×2 IMPLANT
TAPE CLOTH 4X10 WHT NS (GAUZE/BANDAGES/DRESSINGS) ×2 IMPLANT
TAPE CLOTH SURG 6X10 WHT LF (GAUZE/BANDAGES/DRESSINGS) ×1 IMPLANT
TOWEL GREEN STERILE (TOWEL DISPOSABLE) ×2 IMPLANT
TOWEL GREEN STERILE FF (TOWEL DISPOSABLE) ×2 IMPLANT
TRAP SPECIMEN MUCOUS 40CC (MISCELLANEOUS) ×2 IMPLANT
WATER STERILE IRR 1000ML POUR (IV SOLUTION) ×2 IMPLANT

## 2018-11-15 NOTE — Transfer of Care (Signed)
Immediate Anesthesia Transfer of Care Note  Patient: Cynthia Ferguson  Procedure(s) Performed: Anterior Cervical Decompression/Discectomy Fusion Cervical Three-Cervical Four, Cervical Four-Cervical Five, Cervical Five-Cervical Six (N/A )  Patient Location: PACU  Anesthesia Type:General  Level of Consciousness: drowsy  Airway & Oxygen Therapy: Patient Spontanous Breathing, Patient connected to nasal cannula oxygen and oral airway in place  Post-op Assessment: Report given to RN and Post -op Vital signs reviewed and stable  Post vital signs: Reviewed and stable  Last Vitals:  Vitals Value Taken Time  BP 139/60 11/15/2018  3:45 PM  Temp 36.5 C 11/15/2018  3:45 PM  Pulse 85 11/15/2018  3:51 PM  Resp 34 11/15/2018  3:51 PM  SpO2 99 % 11/15/2018  3:51 PM  Vitals shown include unvalidated device data.  Last Pain:  Vitals:   11/15/18 1545  TempSrc:   PainSc: (P) Asleep      Patients Stated Pain Goal: 3 (27/25/36 6440)  Complications: No apparent anesthesia complications

## 2018-11-15 NOTE — Op Note (Signed)
Date of procedure: 11/15/2018  Date of dictation: Same  Service: Neurosurgery  Preoperative diagnosis: Cervical stenosis with myelopathy  Postoperative diagnosis: Same  Procedure Name: C3-4, C4-5, C5-6 anterior cervical discectomy with interbody fusion utilizing interbody peek cages, local harvested autograft, and anterior plate instrumentation  Removal of C6-7 anterior instrumentation with reexploration of C6-7 anterior cervical fusion  Surgeon:Aspen Lawrance A.Mera Gunkel, M.D.  Asst. Surgeon: Reinaldo Meeker, NP  Anesthesia: General  Indication: 63 year old female with a remote history of C6-7 anterior cervical discectomy and fusion with good results.  Patient presents now with worsening neck pain and bilateral upper extremity numbness paresthesias and some early weakness.  Work-up demonstrates evidence of marked adjacent segment disc degeneration at C3-4, C4-5 and C5-6 with severe spinal stenosis and cord compression.  Patient presents now for 3 level anterior cervical decompression and fusion in hopes of improving her symptoms.  Operative note: After induction anesthesia, patient position supine with neck slightly extended and held placed halter traction.  Patient's anterior cervical region prepped draped sterilely.  Incision made overlying C6.  Dissection performed on the right side.  Retractor placed.  Previously placed anterior cervical plate C6-7 was dissected free, disassembled, and then removed.  Fusion was inspected at C6-7 and found to be solid.  Longus coli muscles were elevated bilaterally at C3-4-5 and 6.  Self-retaining retractor was placed.  The spaces at C3-4, C4-5 and C5-6 were then incised.  Discectomy then performed using various instruments down to level the posterior annulus.  Microscope was then brought to the field use throughout the remainder of the discectomies.  Remaining aspects of annulus and osteophytes removed using high-speed drill down to level posterior logical limb.  Posterior  logical was then elevated and resected in piecemeal fashion.  Underlying thecal sac was then identified.  Wide central decompression then performed undercutting the bodies of C3 and C4.  Decompression then proceeded to each neural foramina.  Wide anterior foraminotomies were performed long course exiting C4 nerve roots bilaterally.  At this point a very thorough decompression had been achieved.  There was no evidence of injury to thecal sac or nerve roots.  Procedure then repeated at C4-5 and C5-6 again without complications.  Wound is then irrigated one final time.  Gelfoam was placed topically for hemostasis then removed.  [employer] anatomic peek cages packed with locally harvested autograft were then impacted in place and recessed slightly from the anterior cortical margin at all 3 levels.  Medtronic Atlantis translational plate was then placed over the C3-C6 levels.  This then attached under fluoroscopic guidance with 13 mm fixed angle screws at all 4 levels.  All 8 screws given final tightening found to be solidly within bone.  Locking screws engaged in all levels.  Final images reveal good position of the cages and the hardware at the proper upper level with normal alignment spine.  Wound is then irrigated one final time.  Hemostasis was assured.  Wounds and closed in layers.  Steri-Strips and sterile dressing were applied.  No apparent complications.  Patient tolerated the procedure well and she returns to the recovery room postop

## 2018-11-15 NOTE — Anesthesia Preprocedure Evaluation (Addendum)
Anesthesia Evaluation  Patient identified by MRN, date of birth, ID band Patient awake    Reviewed: Allergy & Precautions, NPO status , Patient's Chart, lab work & pertinent test results  Airway Mallampati: III  TM Distance: >3 FB Neck ROM: Limited    Dental no notable dental hx.    Pulmonary neg pulmonary ROS,    Pulmonary exam normal breath sounds clear to auscultation       Cardiovascular hypertension, Pt. on medications Normal cardiovascular exam Rhythm:Regular Rate:Normal  ECG: NSR, rate 74   Neuro/Psych  Headaches, Anxiety    GI/Hepatic Neg liver ROS, GERD  Medicated and Controlled,  Endo/Other  negative endocrine ROS  Renal/GU negative Renal ROS     Musculoskeletal  (+) Arthritis ,   Abdominal (+) + obese,   Peds  Hematology HLD   Anesthesia Other Findings   Reproductive/Obstetrics                           Anesthesia Physical Anesthesia Plan  ASA: III  Anesthesia Plan: General   Post-op Pain Management:    Induction: Intravenous  PONV Risk Score and Plan: 3 and Midazolam, Dexamethasone, Ondansetron and Treatment may vary due to age or medical condition  Airway Management Planned: Oral ETT and Video Laryngoscope Planned  Additional Equipment:   Intra-op Plan:   Post-operative Plan: Extubation in OR  Informed Consent: I have reviewed the patients History and Physical, chart, labs and discussed the procedure including the risks, benefits and alternatives for the proposed anesthesia with the patient or authorized representative who has indicated his/her understanding and acceptance.   Dental advisory given  Plan Discussed with: CRNA  Anesthesia Plan Comments:        Anesthesia Quick Evaluation

## 2018-11-15 NOTE — Anesthesia Procedure Notes (Signed)
Procedure Name: Intubation Date/Time: 11/15/2018 12:52 PM Performed by: Clearnce Sorrel, CRNA Pre-anesthesia Checklist: Patient identified, Emergency Drugs available, Suction available, Patient being monitored and Timeout performed Patient Re-evaluated:Patient Re-evaluated prior to induction Oxygen Delivery Method: Circle system utilized Preoxygenation: Pre-oxygenation with 100% oxygen Induction Type: IV induction Ventilation: Mask ventilation without difficulty Laryngoscope Size: Glidescope and 3 (limited neck ROM) Grade View: Grade I Tube type: Oral Tube size: 7.0 mm Number of attempts: 1 Airway Equipment and Method: Stylet Placement Confirmation: ETT inserted through vocal cords under direct vision,  positive ETCO2 and breath sounds checked- equal and bilateral Secured at: 22 cm Tube secured with: Tape Dental Injury: Teeth and Oropharynx as per pre-operative assessment

## 2018-11-15 NOTE — Anesthesia Postprocedure Evaluation (Signed)
Anesthesia Post Note  Patient: Cynthia Ferguson  Procedure(s) Performed: Anterior Cervical Decompression/Discectomy Fusion Cervical Three-Cervical Four, Cervical Four-Cervical Five, Cervical Five-Cervical Six (N/A )     Patient location during evaluation: PACU Anesthesia Type: General Level of consciousness: awake and alert Pain management: pain level controlled Vital Signs Assessment: post-procedure vital signs reviewed and stable Respiratory status: spontaneous breathing, nonlabored ventilation, respiratory function stable and patient connected to nasal cannula oxygen Cardiovascular status: blood pressure returned to baseline and stable Postop Assessment: no apparent nausea or vomiting Anesthetic complications: no    Last Vitals:  Vitals:   11/15/18 1645 11/15/18 1706  BP: 140/61 (!) 143/73  Pulse: 81 82  Resp: (!) 21 16  Temp: 36.4 C 36.8 C  SpO2: 95% 97%    Last Pain:  Vitals:   11/15/18 1706  TempSrc: Oral  PainSc:                  Ryan P Ellender

## 2018-11-15 NOTE — Brief Op Note (Signed)
11/15/2018  3:26 PM  PATIENT:  Fernande Boyden  63 y.o. female  PRE-OPERATIVE DIAGNOSIS:  Stenosis  POST-OPERATIVE DIAGNOSIS:  Stenosis  PROCEDURE:  Procedure(s) with comments: Anterior Cervical Decompression/Discectomy Fusion Cervical Three-Cervical Four, Cervical Four-Cervical Five, Cervical Five-Cervical Six (N/A) - Anterior Cervical Decompression/Discectomy Fusion Cervical Three-Cervical Four, Cervical Four-Cervical Five, Cervical Five-Cervical Six  SURGEON:  Surgeon(s) and Role:    * Earnie Larsson, MD - Primary  PHYSICIAN ASSISTANT:   ASSISTANTSReinaldo Meeker, NP   ANESTHESIA:   general  EBL:  150 mL   BLOOD ADMINISTERED:none  DRAINS: none   LOCAL MEDICATIONS USED:  NONE  SPECIMEN:  No Specimen  DISPOSITION OF SPECIMEN:  N/A  COUNTS:  YES  TOURNIQUET:  * No tourniquets in log *  DICTATION: .Dragon Dictation  PLAN OF CARE: Admit to inpatient   PATIENT DISPOSITION:  PACU - hemodynamically stable.   Delay start of Pharmacological VTE agent (>24hrs) due to surgical blood loss or risk of bleeding: yes

## 2018-11-15 NOTE — H&P (Signed)
Cynthia Ferguson is an 63 y.o. female.   Chief Complaint: Neck pain HPI: 63 year old female with history of prior C6-7 anterior cervical discectomy and fusion with good results presents now with increasing neck pain and bilateral upper extremity numbness paresthesias and some progressive weakness.  Work-up demonstrates evidence of severe adjacent level disc degeneration at C3-4, C4-5 and C5-6 with resultant disc bulging and osteophytic overgrowth causing moderately severe spinal stenosis and early cord compression.  Patient presents now for 3 level anterior cervical decompression and fusion in hopes of improving her symptoms.  Past Medical History:  Diagnosis Date  . Anxiety   . Arthritis   . Bladder incontinence   . Cancer ([place]) 2014   left breast   . Complication of anesthesia    "had trouble waking up"  . GERD (gastroesophageal reflux disease)   . Headache(784.0)    migraines  . High cholesterol   . Hypertension   . Personal history of radiation therapy 2014    Past Surgical History:  Procedure Laterality Date  . ABDOMINAL HYSTERECTOMY     COMPLETE   . BACK SURGERY  2019  . BREAST EXCISIONAL BIOPSY Left    age 38...benign  . BREAST LUMPECTOMY Left 2014  . BREAST LUMPECTOMY WITH NEEDLE LOCALIZATION Left 07/11/2013   Procedure: BREAST LUMPECTOMY WITH NEEDLE LOCALIZATION;  Surgeon: Rolm Bookbinder, MD;  Location: [facility];  Service: General;  Laterality: Left;  needle localization at [facility] 11:00   . BREAST SURGERY     LEFT BREAST LUMPECTOMY - BENIGN  . BUNIONECTOMY  1993   LEFT FOOT  . BUNIONECTOMY Right NOVEMBER 2013   HAMMER TOE  . NECK SURGERY  2002   DISC REPLACED IN NECK  . right knee athroscopy     2006  . TUBAL LIGATION      Family History  Problem Relation Age of Onset  . Diabetes Mother   . Hypertension Mother   . Heart disease Mother   . Hypertension Father   . Heart disease Father   . Lung cancer Father   . Liver cancer Father   . Cancer  Father   . Hypertension Sister   . Breast cancer Sister   . Heart attack Sister   . Hypertension Brother   . Hypertension Sister   . Breast cancer Maternal Aunt   . Breast cancer Paternal Aunt    Social History:  reports that she has never smoked. She has never used smokeless tobacco. She reports that she does not drink alcohol or use drugs.  Allergies:  Allergies  Allergen Reactions  . Benzonatate Anaphylaxis  . Amlodipine Swelling  . Dicloxacillin Rash and Other (See Comments)    Rash and numbness on face  . Trimethoprim Rash    Medications Prior to Admission  Medication Sig Dispense Refill  . acetaminophen (TYLENOL) 325 MG tablet Take 650 mg by mouth every 6 (six) hours as needed for mild pain or moderate pain.     Marland Kitchen ALPRAZolam (XANAX) 0.5 MG tablet Take 0.25-0.5 mg by mouth See admin instructions. Take 0.25 mg by mouth in the daytime if needed and 0.5mg  at bedtime    . aspirin-acetaminophen-caffeine (EXCEDRIN MIGRAINE) 250-250-65 MG per tablet Take 2 tablets by mouth every 8 (eight) hours as needed for headache.     . Biotin w/ Vitamins C & E (HAIR/SKIN/NAILS PO) Take 1 tablet by mouth daily.    . calcium-vitamin D (OSCAL) 250-125 MG-UNIT per tablet Take 2 tablets by mouth 2 (  two) times daily.     . cetirizine (ZYRTEC) 10 MG tablet Take 10 mg by mouth at bedtime.    . diclofenac (VOLTAREN) 50 MG EC tablet Take 50 mg by mouth 2 (two) times daily.    . diphenhydrAMINE (BENADRYL) 25 MG tablet Take 25 mg by mouth every 8 (eight) hours as needed for allergies.     . hydrochlorothiazide (HYDRODIURIL) 25 MG tablet Take 25 mg by mouth daily.    Marland Kitchen losartan (COZAAR) 100 MG tablet Take 100 mg by mouth daily.    . Multiple Vitamins-Calcium (ONE-A-DAY WOMENS FORMULA PO) Take 1 tablet by mouth daily.    . naproxen sodium (ALEVE) 220 MG tablet Take 440 mg by mouth daily as needed (pain).    . nitrofurantoin (MACRODANTIN) 100 MG capsule Take 100 mg by mouth at bedtime. UTI Prevention     .  omeprazole (PRILOSEC) 20 MG capsule Take 20 mg by mouth daily.    . polyethylene glycol (MIRALAX / GLYCOLAX) packet Take 17 g by mouth daily as needed for mild constipation.     Marland Kitchen Propylene Glycol (SYSTANE BALANCE) 0.6 % SOLN Place 1 drop into both eyes 3 (three) times daily as needed (dry eyes).    . simvastatin (ZOCOR) 40 MG tablet Take 40 mg by mouth at bedtime.    . diazepam (VALIUM) 5 MG tablet Take 1-2 tablets (5-10 mg total) by mouth every 6 (six) hours as needed for muscle spasms. (Patient not taking: Reported on 11/01/2018) 50 tablet 0  . gabapentin (NEURONTIN) 300 MG capsule Take 300 mg by mouth daily as needed (nerve pain).      No results found for this or any previous visit (from the past 48 hour(s)). No results found.  Pertinent items noted in HPI and remainder of comprehensive ROS otherwise negative.  Blood pressure (!) 156/66, pulse 86, temperature 98 F (36.7 C), temperature source Oral, resp. rate 18, height 5' (1.524 m), weight 77.1 kg.  Patient is awake and alert.  She is oriented and appropriate.  Speech is fluent.  Judgment insight are intact.  Cranial nerve function normal bilaterally.  Motor examination of her extremities reveals mild weakness of her intrinsics bilaterally otherwise motor strength is intact.  Sensory examination with some patchy distal sensory loss in both distal upper extremities.  Reflexes are brisk.  No evidence of long track signs.  Gait is reasonably normal.  Posture is normal.  Examination head ears eyes nose throat is unremarked.  Chest and abdomen are benign.  Extremities are free from injury deformity. Assessment/Plan C3-4, C4-5, C5-6 spondylosis with stenosis and radiculopathy/myelopathy.  Plan C3-4, C4-5, C5-6 anterior cervical discectomy with interbody fusion utilizing interbody cages, locally harvested autograft, and anterior plate instrumentation.  Risks and benefits of been explained.  Patient wishes to proceed.  Cooper Render Hedi Barkan 11/15/2018,  12:24 PM

## 2018-11-16 MED ORDER — DICLOFENAC SODIUM 50 MG PO TBEC: 50.0000 mg | DELAYED_RELEASE_TABLET | Freq: Two times a day (BID) | ORAL | Status: AC

## 2018-11-16 MED ORDER — NAPROXEN SODIUM 220 MG PO TABS: 440.0000 mg | ORAL_TABLET | Freq: Every day | ORAL | Status: AC | PRN

## 2018-11-16 MED ORDER — HYDROXYZINE HCL 50 MG/ML IM SOLN: 50.0000 mg | Freq: Once | INTRAMUSCULAR | Status: DC

## 2018-11-16 MED ORDER — ASPIRIN-ACETAMINOPHEN-CAFFEINE 250-250-65 MG PO TABS: 2.0000 | ORAL_TABLET | Freq: Three times a day (TID) | ORAL | 0 refills | Status: AC | PRN

## 2018-11-16 MED ORDER — HYDROCODONE-ACETAMINOPHEN 10-325 MG PO TABS: 1.0000 | ORAL_TABLET | ORAL | 0 refills | Status: DC | PRN

## 2018-11-16 MED ORDER — CYCLOBENZAPRINE HCL 10 MG PO TABS: 10.0000 mg | ORAL_TABLET | Freq: Three times a day (TID) | ORAL | 0 refills | Status: AC | PRN

## 2018-11-16 NOTE — Evaluation (Addendum)
Occupational Therapy Evaluation Patient Details Name: Cynthia Ferguson MRN: [id number] DOB: [birth year] Today's Date: 11/16/2018    History of Present Illness Pt is a 63 y/o female a/p C3-4, C4-5, C5-6 ACDF , removal of C6-7 anteiror instrumentation.  PMH: anxiety, arthritis, HTN, CA, back surgery.    Clinical Impression   PTA patient independent and working. Admitted for above and limited by pain, precautions.  Patient reports she will have intermittent assistance at discharge, at this time requires supervision for UB ADLs, supervision for LB ADls and supervision for transfers. Short distance functional mobility completed with min guard initially fading to supervision for safety. Patient educated on precautions, AE use (has reacher, sock aide and long sponge at home), DME, recommendations, brace wear schedule and management, mobility, posture and safety.  Pt reports history of falls, and highly recommended assist for showers; patient agreeable. Patient will benefit from continued OT services while admitted but anticipate no further needs after dc.  Will follow.     Follow Up Recommendations  No OT follow up;Supervision - Intermittent    Equipment Recommendations  Tub/shower seat    Recommendations for Other Services       Precautions / Restrictions Precautions Precautions: Cervical;Fall Precaution Booklet Issued: Yes (comment) Precaution Comments: reviewed precautions with patient  Required Braces or Orthoses: Cervical Brace Cervical Brace: Soft collar;At all times Restrictions Weight Bearing Restrictions: No(Simultaneous filing. User may not have seen previous data.)      Mobility Bed Mobility Overal bed mobility: Needs Assistance Bed Mobility: Rolling;Sidelying to Sit;Sit to Sidelying Rolling: Supervision Sidelying to sit: Supervision     Sit to sidelying: Supervision General bed mobility comments: supervision for log roll technique and safety  Transfers Overall transfer  level: Needs assistance Equipment used: 1 person hand held assist;None Transfers: Sit to/from Stand Sit to Stand: Min guard;Supervision         General transfer comment: hand held assist initally with min guard, fading to superivsion     Balance Overall balance assessment: Mild deficits observed, not formally tested                                         ADL either performed or assessed with clinical judgement   ADL Overall ADL's : Needs assistance/impaired     Grooming: Supervision/safety;Standing   Upper Body Bathing: Supervision/ safety;Sitting;Cueing for sequencing;Cueing for compensatory techniques   Lower Body Bathing: Supervison/ safety;With adaptive equipment;Sit to/from stand;Cueing for compensatory techniques Lower Body Bathing Details (indicate cue type and reason): reviewed bathing seated using long sponge  Upper Body Dressing : Set up;Sitting   Lower Body Dressing: Set up;Sit to/from stand;Cueing for compensatory techniques;With adaptive equipment Lower Body Dressing Details (indicate cue type and reason): patient verbalizes understanding of using AE for LB dressing, seated for safety; limited figure 4 due to knee arthritis   Toilet Transfer: Supervision/safety;Ambulation   Toileting- Clothing Manipulation and Hygiene: Supervision/safety;Sit to/from stand   Tub/ Shower Transfer: Min guard;Ambulation;Cueing for safety;Cueing for sequencing;Shower Scientist, research (medical) Details (indicate cue type and reason): patient requires min guard for simulated transfer, reviewed safety and only completing shower transfers with assist at this time; patient reports "I'll purchase a shower chair, and I don't plan on showering for a few weeks."  Functional mobility during ADLs: Min guard;Supervision/safety General ADL Comments: min guard initally for safety, fading to supervision; reviewed safety, precautions and recommendsations regarding cervical surgery and  ADLs     Vision Baseline Vision/History: Wears glasses Wears Glasses: Reading only Patient Visual Report: No change from baseline Vision Assessment?: No apparent visual deficits     Perception     Praxis      Pertinent Vitals/Pain Pain Assessment: 0-10 Pain Score: 8  Pain Location: neck Pain Descriptors / Indicators: Discomfort;Operative site guarding Pain Intervention(s): Monitored during session;Repositioned     Hand Dominance Right   Extremity/Trunk Assessment Upper Extremity Assessment Upper Extremity Assessment: Overall WFL for tasks assessed(within movement precautions < 90 FF, mild numbness B hands)   Lower Extremity Assessment Lower Extremity Assessment: Overall WFL for tasks assessed   Cervical / Trunk Assessment Cervical / Trunk Assessment: Other exceptions Cervical / Trunk Exceptions: s/p ACDF   Communication Communication Communication: No difficulties   Cognition Arousal/Alertness: Awake/alert Behavior During Therapy: WFL for tasks assessed/performed Overall Cognitive Status: Within Functional Limits for tasks assessed                                     General Comments       Exercises     Shoulder Instructions      Home Living Family/patient expects to be discharged to:: Private residence Living Arrangements: Alone Available Help at Discharge: Family;Available PRN/intermittently Type of Home: House Home Access: Stairs to enter [employer] of Steps: 3 Entrance Stairs-Rails: (+ rail) Home Layout: One level     Bathroom Shower/Tub: Teacher, early years/pre: Standard     Home Equipment: Museum/gallery conservator - 2 wheels;Cane - single point;Bedside commode;Adaptive equipment Adaptive Equipment: Reacher;Sock aid;Long-handled sponge        Prior Functioning/Environment Level of Independence: Independent        Comments: independent and driving         OT Problem List: Decreased range of  motion;Impaired balance (sitting and/or standing);Pain;Decreased safety awareness;Decreased knowledge of use of DME or AE;Decreased knowledge of precautions      OT Treatment/Interventions: Self-care/ADL training;Neuromuscular education;DME and/or AE instruction;Balance training;Cognitive remediation/compensation;Therapeutic activities    OT Goals(Current goals can be found in the care plan section) Acute Rehab OT Goals Patient Stated Goal: to get home  OT Goal Formulation: With patient Time For Goal Achievement: 11/30/18 Potential to Achieve Goals: Good  OT Frequency: Min 2X/week   Barriers to D/C:            Co-evaluation              AM-PAC OT "6 Clicks" Daily Activity     Outcome Measure Help from another person eating meals?: None Help from another person taking care of personal grooming?: None Help from another person toileting, which includes using toliet, bedpan, or urinal?: None Help from another person bathing (including washing, rinsing, drying)?: None Help from another person to put on and taking off regular upper body clothing?: None Help from another person to put on and taking off regular lower body clothing?: None 6 Click Score: 24   End of Session Equipment Utilized During Treatment: Cervical collar Nurse Communication: Mobility status  Activity Tolerance: Patient tolerated treatment well Patient left: in bed;with call bell/phone within reach  OT Visit Diagnosis: Muscle weakness (generalized) (M62.81);Pain Pain - part of body: (neck)                Time: 9163-8466 OT Time Calculation (min): 27 min Charges:  OT General Charges $OT Visit: 1 Visit OT Evaluation $  OT Eval Moderate Complexity: 1 Mod OT Treatments $Self Care/Home Management : 8-22 mins  Delight Stare, OT Acute Rehabilitation Services Pager [phone] Office [phone]   Delight Stare 11/16/2018, 8:32 AM

## 2018-11-16 NOTE — Progress Notes (Signed)
Patient is discharged from room 3C06 at this time. Alert and in stable condition. IV site d/c'd and instructions read to patient and family with understanding verbalized. Left unit via wheelchair with all belongings at side.

## 2018-11-16 NOTE — Progress Notes (Addendum)
  NEUROSURGERY PROGRESS NOTE   No issues overnight.  Endorses mild neck soreness Pre op pain resolved Continued mild right hand weakness and digit 1-4 numbness Voiding normal Mild dysphagia but tolerating po No concerns this am  EXAM:  BP (!) 119/55 (BP Location: Right Arm)   Pulse 81   Temp 98.2 F (36.8 C) (Oral)   Resp 18   Ht 5' (1.524 m)   Wt 77.1 kg   SpO2 95%   BMI 33.20 kg/m   Awake, alert, oriented  Speech fluent, appropriate  CN grossly intact Mild intrinsic weakness, otherwise grossly normal Incision c/d/i, no swelling  PLAN Doing well Work with therapy this am D/c home after therapy

## 2018-11-16 NOTE — Discharge Summary (Signed)
Physician Discharge Summary  Patient ID: Cynthia Ferguson MRN: [id number] DOB/AGE: 02-14-1956 63 y.o.  Admit date: 11/15/2018 Discharge date: 11/16/2018  Admission Diagnoses:  Cervical myelopathy  Discharge Diagnoses:  Same Active Problems:   Cervical myelopathy [facility])   Discharged Condition: Stable  Hospital Course:  Cynthia Ferguson is a 63 y.o. female who was admitted for the below procedure. There were no post operative complications. At time of discharge, pain was well controlled, ambulating with Pt/OT, tolerating po, voiding normal. Ready for discharge.  Treatments: Surgery -  C3-4, C4-5, C5-6 anterior cervical discectomy with interbody fusion utilizing interbody peek cages, local harvested autograft, and anterior plate instrumentation. Removal of C6-7 anterior instrumentation with reexploration of C6-7 anterior cervical fusion  Discharge Exam: Blood pressure (!) 119/55, pulse 81, temperature 98.2 F (36.8 C), temperature source Oral, resp. rate 18, height 5' (1.524 m), weight 77.1 kg, SpO2 95 %. Awake, alert, oriented Speech fluent, appropriate CN grossly intact Grossly normal, mild intrinsic weakness Wound c/d/i  Disposition: Discharge disposition: 01-Home or Self Care       Discharge Instructions    Call MD for:  difficulty breathing, headache or visual disturbances   Complete by:  As directed    Call MD for:  persistant dizziness or light-headedness   Complete by:  As directed    Call MD for:  redness, tenderness, or signs of infection (pain, swelling, redness, odor or green/yellow discharge around incision site)   Complete by:  As directed    Call MD for:  severe uncontrolled pain   Complete by:  As directed    Call MD for:  temperature >100.4   Complete by:  As directed    Diet general   Complete by:  As directed    Driving Restrictions   Complete by:  As directed    Do not drive until given clearance.   Increase activity slowly   Complete by:  As  directed    Lifting restrictions   Complete by:  As directed    Do not lift anything >10lbs. Avoid bending and twisting in awkward positions. Avoid bending at the back.   May shower / Bathe   Complete by:  As directed    In 24 hours. Okay to wash wound with warm soapy water. Avoid scrubbing the wound. Pat dry.   Remove dressing in 24 hours   Complete by:  As directed      Allergies as of 11/16/2018      Reactions   Benzonatate Anaphylaxis   Amlodipine Swelling   Dicloxacillin Rash, Other (See Comments)   Rash and numbness on face   Trimethoprim Rash      Medication List    TAKE these medications   acetaminophen 325 MG tablet Commonly known as:  TYLENOL Take 650 mg by mouth every 6 (six) hours as needed for mild pain or moderate pain.   ALPRAZolam 0.5 MG tablet Commonly known as:  XANAX Take 0.25-0.5 mg by mouth See admin instructions. Take 0.25 mg by mouth in the daytime if needed and 0.5mg  at bedtime   aspirin-acetaminophen-caffeine 250-250-65 MG tablet Commonly known as:  EXCEDRIN MIGRAINE Take 2 tablets by mouth every 8 (eight) hours as needed for headache. Start taking on:  November 20, 2018 What changed:  These instructions start on November 20, 2018. If you are unsure what to do until then, ask your doctor or other care provider.   calcium-vitamin D 250-125 MG-UNIT tablet Commonly known as:  OSCAL Take 2  tablets by mouth 2 (two) times daily.   cetirizine 10 MG tablet Commonly known as:  ZYRTEC Take 10 mg by mouth at bedtime.   cyclobenzaprine 10 MG tablet Commonly known as:  FLEXERIL Take 1 tablet (10 mg total) by mouth 3 (three) times daily as needed for muscle spasms.   diazepam 5 MG tablet Commonly known as:  VALIUM Take 1-2 tablets (5-10 mg total) by mouth every 6 (six) hours as needed for muscle spasms.   diclofenac 50 MG EC tablet Commonly known as:  VOLTAREN Take 1 tablet (50 mg total) by mouth 2 (two) times daily. Start taking on:  November 21, 2018 What changed:  These instructions start on November 21, 2018. If you are unsure what to do until then, ask your doctor or other care provider.   diphenhydrAMINE 25 MG tablet Commonly known as:  BENADRYL Take 25 mg by mouth every 8 (eight) hours as needed for allergies.   gabapentin 300 MG capsule Commonly known as:  NEURONTIN Take 300 mg by mouth daily as needed (nerve pain).   HAIR/SKIN/NAILS PO Take 1 tablet by mouth daily.   hydrochlorothiazide 25 MG tablet Commonly known as:  HYDRODIURIL Take 25 mg by mouth daily.   HYDROcodone-acetaminophen 10-325 MG tablet Commonly known as:  NORCO Take 1-2 tablets by mouth every 4 (four) hours as needed.   losartan 100 MG tablet Commonly known as:  COZAAR Take 100 mg by mouth daily.   naproxen sodium 220 MG tablet Commonly known as:  ALEVE Take 2 tablets (440 mg total) by mouth daily as needed (pain). Start taking on:  November 21, 2018 What changed:  These instructions start on November 21, 2018. If you are unsure what to do until then, ask your doctor or other care provider.   nitrofurantoin 100 MG capsule Commonly known as:  MACRODANTIN Take 100 mg by mouth at bedtime. UTI Prevention   omeprazole 20 MG capsule Commonly known as:  PRILOSEC Take 20 mg by mouth daily.   ONE-A-DAY WOMENS FORMULA PO Take 1 tablet by mouth daily.   polyethylene glycol packet Commonly known as:  MIRALAX / GLYCOLAX Take 17 g by mouth daily as needed for mild constipation.   simvastatin 40 MG tablet Commonly known as:  ZOCOR Take 40 mg by mouth at bedtime.   SYSTANE BALANCE 0.6 % Soln Generic drug:  Propylene Glycol Place 1 drop into both eyes 3 (three) times daily as needed (dry eyes).      Follow-up Information    Earnie Larsson, MD Follow up.   Specialty:  Neurosurgery Contact information: [address] [place] [address] [place] [address] [phone]           Signed: Traci Sermon 11/16/2018, 6:12 AM

## 2018-11-16 NOTE — Discharge Instructions (Signed)

## 2018-11-18 ENCOUNTER — Encounter (HOSPITAL_COMMUNITY): Payer: Self-pay | Admitting: Neurosurgery

## 2018-12-30 ENCOUNTER — Ambulatory Visit: Payer: BLUE CROSS/BLUE SHIELD | Admitting: Obstetrics and Gynecology

## 2019-03-20 ENCOUNTER — Other Ambulatory Visit: Payer: Self-pay | Admitting: Obstetrics & Gynecology

## 2019-03-20 DIAGNOSIS — Z1231 Encounter for screening mammogram for malignant neoplasm of breast: Secondary | ICD-10-CM

## 2019-04-11 ENCOUNTER — Encounter: Payer: Self-pay | Admitting: Gastroenterology

## 2019-05-08 ENCOUNTER — Ambulatory Visit (AMBULATORY_SURGERY_CENTER): Payer: Self-pay

## 2019-05-08 ENCOUNTER — Other Ambulatory Visit: Payer: Self-pay

## 2019-05-08 VITALS — Ht 60.0 in | Wt 170.0 lb

## 2019-05-08 DIAGNOSIS — Z1211 Encounter for screening for malignant neoplasm of colon: Secondary | ICD-10-CM

## 2019-05-08 NOTE — Progress Notes (Signed)
Denies allergies to eggs or soy products. Denies complication of anesthesia or sedation. Denies use of weight loss medication. Denies use of O2.   Emmi instructions given for colonoscopy.  Pre-Visit was conducted by phone due to Covid 19. Instructions were reviewed and mailed to patients confirmed home address. Patient was originally given Suprep however it was contraindicated due to one of her allergies. Patient did request Miralax like she used in the past, the results were good, so I gave her Miralax prep. Patient was encouraged to call if she had any questions regarding instructions.

## 2019-05-22 ENCOUNTER — Encounter: Payer: BLUE CROSS/BLUE SHIELD | Admitting: Gastroenterology

## 2019-06-17 ENCOUNTER — Ambulatory Visit
Admission: RE | Admit: 2019-06-17 | Discharge: 2019-06-17 | Disposition: A | Discharge status: Home / Self Care | Payer: BLUE CROSS/BLUE SHIELD | Source: Ambulatory Visit | Attending: Obstetrics & Gynecology | Admitting: Obstetrics & Gynecology

## 2019-06-17 ENCOUNTER — Other Ambulatory Visit: Payer: Self-pay

## 2019-06-17 DIAGNOSIS — Z1231 Encounter for screening mammogram for malignant neoplasm of breast: Secondary | ICD-10-CM

## 2019-08-26 ENCOUNTER — Other Ambulatory Visit: Payer: Self-pay

## 2019-08-26 DIAGNOSIS — Z20822 Contact with and (suspected) exposure to covid-19: Secondary | ICD-10-CM

## 2019-08-27 LAB — NOVEL CORONAVIRUS, NAA: SARS-CoV-2, NAA: NOT DETECTED

## 2019-09-03 ENCOUNTER — Encounter: Payer: Self-pay | Admitting: Obstetrics and Gynecology

## 2019-09-03 ENCOUNTER — Other Ambulatory Visit: Payer: Self-pay

## 2019-09-03 ENCOUNTER — Ambulatory Visit (INDEPENDENT_AMBULATORY_CARE_PROVIDER_SITE_OTHER): Payer: BC Managed Care – PPO | Admitting: Obstetrics and Gynecology

## 2019-09-03 VITALS — BP 150/85 | HR 73 | Ht 62.0 in | Wt 178.6 lb

## 2019-09-03 DIAGNOSIS — Z01419 Encounter for gynecological examination (general) (routine) without abnormal findings: Secondary | ICD-10-CM

## 2019-09-03 DIAGNOSIS — N393 Stress incontinence (female) (male): Secondary | ICD-10-CM

## 2019-09-03 NOTE — Progress Notes (Signed)
Patient ID: Cynthia Ferguson, female   DOB: 07-12-1956, 63 y.o.   MRN: [id number]  Assessment:  Annual Gyn Exam Mild cystocele Stress urinary incontinence History of indeterminant MRI left breast 09/2018, with recommendations for 72-month repeat MRI.  Normal 3D mammogram done in August 2020 Plan:  1.  2.  prn 3    Annual mammogram advised for 65-70 Subjective:  Cynthia Ferguson is a 63 y.o. female G2P2002 who presents for annual exam. No LMP recorded. Patient has had a hysterectomy. The patient has complaints today of bladder prolapse and urge incontinence. She wears a pad often to control leakage. She has a hx of breast cancer, dx in 2014. She underwent radiation therapy. She had a 3D mammogram in 06/17/2019 with normal results. Son tested positive for Covid on oct 18th. She has tested negative.  The following portions of the patient's history were reviewed and updated as appropriate: allergies, current medications, past family history, past medical history, past social history, past surgical history and problem list. Past Medical History:  Diagnosis Date  . Allergy   . Anemia   . Anxiety   . Arthritis   . Bladder incontinence   . Cancer ([place]) 2014   left breast   . Complication of anesthesia    "had trouble waking up"  . GERD (gastroesophageal reflux disease)   . Headache(784.0)    migraines  . High cholesterol   . Hypertension   . Personal history of radiation therapy 2014    Past Surgical History:  Procedure Laterality Date  . ABDOMINAL HYSTERECTOMY     COMPLETE   . ANTERIOR CERVICAL DECOMP/DISCECTOMY FUSION N/A 11/15/2018   Procedure: Anterior Cervical Decompression/Discectomy Fusion Cervical Three-Cervical Four, Cervical Four-Cervical Five, Cervical Five-Cervical Six;  Surgeon: Earnie Larsson, MD;  Location: [facility];  Service: Neurosurgery;  Laterality: N/A;  Anterior Cervical Decompression/Discectomy Fusion Cervical Three-Cervical Four, Cervical Four-Cervical Five, Cervical  Five-Cervical Six  . BACK SURGERY  2019  . BREAST EXCISIONAL BIOPSY Left    age 33...benign  . BREAST LUMPECTOMY Left 2014  . BREAST LUMPECTOMY WITH NEEDLE LOCALIZATION Left 07/11/2013   Procedure: BREAST LUMPECTOMY WITH NEEDLE LOCALIZATION;  Surgeon: Rolm Bookbinder, MD;  Location: [facility];  Service: General;  Laterality: Left;  needle localization at BCG 11:00   . BREAST SURGERY     LEFT BREAST LUMPECTOMY - BENIGN  . BUNIONECTOMY  1993   LEFT FOOT  . BUNIONECTOMY Right NOVEMBER 2013   HAMMER TOE  . NECK SURGERY  2002   DISC REPLACED IN NECK  . right knee athroscopy     2006  . TUBAL LIGATION       Current Outpatient Medications:  .  acetaminophen (TYLENOL) 325 MG tablet, Take 650 mg by mouth every 6 (six) hours as needed for mild pain or moderate pain. , Disp: , Rfl:  .  ALPRAZolam (XANAX) 0.5 MG tablet, Take 0.25-0.5 mg by mouth See admin instructions. Take 0.25 mg by mouth in the daytime if needed and 0.5mg  at bedtime, Disp: , Rfl:  .  aspirin-acetaminophen-caffeine (EXCEDRIN MIGRAINE) 250-250-65 MG tablet, Take 2 tablets by mouth every 8 (eight) hours as needed for headache., Disp: 30 tablet, Rfl: 0 .  Biotin w/ Vitamins C & E (HAIR/SKIN/NAILS PO), Take 1 tablet by mouth daily., Disp: , Rfl:  .  calcium-vitamin D (OSCAL) 250-125 MG-UNIT per tablet, Take 2 tablets by mouth 2 (two) times daily. , Disp: , Rfl:  .  cetirizine (ZYRTEC) 10 MG tablet, Take  10 mg by mouth at bedtime., Disp: , Rfl:  .  cyclobenzaprine (FLEXERIL) 10 MG tablet, Take 1 tablet (10 mg total) by mouth 3 (three) times daily as needed for muscle spasms., Disp: 90 tablet, Rfl: 0 .  diclofenac (VOLTAREN) 50 MG EC tablet, Take 1 tablet (50 mg total) by mouth 2 (two) times daily., Disp: , Rfl:  .  diphenhydrAMINE (BENADRYL) 25 MG tablet, Take 25 mg by mouth every 8 (eight) hours as needed for allergies. , Disp: , Rfl:  .  hydrochlorothiazide (HYDRODIURIL) 25 MG tablet, Take 25 mg by mouth daily.,  Disp: , Rfl:  .  losartan (COZAAR) 100 MG tablet, Take 100 mg by mouth daily., Disp: , Rfl:  .  Multiple Vitamins-Calcium (ONE-A-DAY WOMENS FORMULA PO), Take 1 tablet by mouth daily., Disp: , Rfl:  .  naproxen sodium (ALEVE) 220 MG tablet, Take 2 tablets (440 mg total) by mouth daily as needed (pain)., Disp: , Rfl:  .  nitrofurantoin (MACRODANTIN) 100 MG capsule, Take 100 mg by mouth at bedtime. UTI Prevention , Disp: , Rfl:  .  omeprazole (PRILOSEC) 20 MG capsule, Take 20 mg by mouth daily., Disp: , Rfl:  .  polyethylene glycol (MIRALAX / GLYCOLAX) packet, Take 17 g by mouth daily as needed for mild constipation. , Disp: , Rfl:  .  polyethylene glycol powder (GLYCOLAX/MIRALAX) 17 GM/SCOOP powder, Take 1 Container by mouth once., Disp: , Rfl:  .  Propylene Glycol (SYSTANE BALANCE) 0.6 % SOLN, Place 1 drop into both eyes 3 (three) times daily as needed (dry eyes)., Disp: , Rfl:  .  simvastatin (ZOCOR) 40 MG tablet, Take 40 mg by mouth at bedtime., Disp: , Rfl:   Review of Systems Constitutional: negative Gastrointestinal: negative Genitourinary: urinary prolapse and incontinence   CLINICAL DATA:  63 year old female presents for tissue sampling of 2 indeterminate areas of non masslike MR enhancement within the LEFT breast. History of LEFT breast cancer and lumpectomy.  LABS:  None performed today  EXAM: 09/26/2018 MR OF THE LEFT BREAST WITH AND WITHOUT CONTRAST  TECHNIQUE: Multiplanar multisequence MR images of the left breast were obtained prior to and following the intravenous administration of 8 ml of Gadavist.  Three-dimensional MR images were rendered by post-processing of the original MR data on an independent workstation. The three-dimensional MR images were interpreted, and findings are reported in the following complete MRI report for this study. Three dimensional images were evaluated at the independent DynaCad workstation  COMPARISON:  None.  FINDINGS: Breast  composition: b. Scattered fibroglandular tissue.  Background parenchymal enhancement: Minimal  Left breast: Surgical changes within the LEFT breast are again noted. No suspicious areas of enhancement are identified today. No abnormal enhancement is identified on the immediate post-contrast sequence. The non masslike enhancement identified on recent MR is much less confluent on delayed images and has the appearance of normal fibroglandular tissue when the breast is in compression.  Lymph nodes: No abnormal appearing lymph nodes.  Ancillary findings:  None.  IMPRESSION: 1. Indeterminate areas of non masslike enhancement on recent MRI are not replicated on today study and therefore MR guided LEFT breast biopsies were canceled. Six-month MRI follow-up is recommended to ensure stability.  RECOMMENDATION: Bilateral breast MRI in 6 months.  BI-RADS CATEGORY  2: Benign.   Electronically Signed   By: Margarette Canada M.D.   On: 09/26/2018 08:52   CLINICAL DATA:  Screening.  EXAM:  On: 06/17/2019 16:23 DIGITAL SCREENING BILATERAL MAMMOGRAM WITH TOMO AND CAD  COMPARISON:  Previous exam(s).  ACR Breast Density Category c: The breast tissue is heterogeneously dense, which may obscure small masses.  FINDINGS: There are no findings suspicious for malignancy. Images were processed with CAD.  IMPRESSION: No mammographic evidence of malignancy. A result letter of this screening mammogram will be mailed directly to the patient.  RECOMMENDATION: Screening mammogram in one year. (Code:SM-B-01Y)  BI-RADS CATEGORY  1: Negative.   Electronically Signed   By: Claudie Revering M.D.   On: 06/17/2019 16:23 Objective:  BP (!) 150/85 (BP Location: Right Arm, Patient Position: Sitting, Cuff Size: Normal)   Pulse 73   Ht 5\' 2"  (1.575 m)   Wt 178 lb 9.6 oz (81 kg)   BMI 32.67 kg/m    BMI: Body mass index is 32.67 kg/m.  General Appearance: Alert, appropriate appearance for  age. No acute distress HEENT: Grossly normal Neck / Thyroid:  Cardiovascular: RRR; normal S1, S2, no murmur Lungs: CTA bilaterally Back: No CVAT Breast Exam: s/p radiation therapy, nipple inversion, normal for her. No masses or nodes.No dimpling,  or discharge. Gastrointestinal: Soft, non-tender, no masses or organomegaly Pelvic Exam:  BLADDER: positive marshal test, immediate loss of urine when coughing in horizontal position. VAGINA: mild cystocele CERVIX: surgically absent  UTERUS: absent. Rectal exam: stool guaiac negative. No rectocele Lymphatic Exam: Non-palpable nodes in neck, clavicular, axillary, or inguinal regions  Skin: no rash or abnormalities Neurologic: Normal gait and speech, no tremor  Psychiatric: Alert and oriented, appropriate affect.  Urinalysis:Not done  By signing my name below, I, Samul Dada, attest that this documentation has been prepared under the direction and in the presence of Jonnie Kind, MD. Electronically Signed: [place]. 09/03/19. 8:49 AM.  I personally performed the services described in this documentation, which was SCRIBED in my presence. The recorded information has been reviewed and considered accurate. It has been edited as necessary during review. Jonnie Kind, MD

## 2019-10-07 ENCOUNTER — Other Ambulatory Visit: Payer: Self-pay

## 2019-10-07 DIAGNOSIS — Z20822 Contact with and (suspected) exposure to covid-19: Secondary | ICD-10-CM

## 2019-10-08 LAB — NOVEL CORONAVIRUS, NAA: SARS-CoV-2, NAA: NOT DETECTED

## 2020-01-21 ENCOUNTER — Other Ambulatory Visit: Payer: Self-pay

## 2020-01-21 ENCOUNTER — Encounter (HOSPITAL_COMMUNITY): Payer: Self-pay | Admitting: Physical Therapy

## 2020-01-21 ENCOUNTER — Ambulatory Visit (HOSPITAL_COMMUNITY): Payer: BC Managed Care – PPO | Attending: Physician Assistant | Admitting: Physical Therapy

## 2020-01-21 DIAGNOSIS — M25561 Pain in right knee: Secondary | ICD-10-CM | POA: Insufficient documentation

## 2020-01-21 DIAGNOSIS — G8929 Other chronic pain: Secondary | ICD-10-CM

## 2020-01-21 DIAGNOSIS — R262 Difficulty in walking, not elsewhere classified: Secondary | ICD-10-CM | POA: Diagnosis present

## 2020-01-21 DIAGNOSIS — M25661 Stiffness of right knee, not elsewhere classified: Secondary | ICD-10-CM

## 2020-01-21 NOTE — Therapy (Signed)
[place], [place], [address] Phone: [phone]   Fax:  [phone]  Physical Therapy Evaluation  Patient Details  Name: Cynthia Ferguson MRN: [id number] Date of Birth: 01-29-1956 Referring Provider (PT): Gerrit Halls   Encounter Date: 01/21/2020  PT End of Session - 01/21/20 1531    Visit Number  1    Number of Visits  12    Date for PT Re-Evaluation  02/20/20    Authorization Type  BCBS    Authorization - Visit Number  1    Authorization - Number of Visits  90    Progress Note Due on Visit  10    PT Start Time  0920    PT Stop Time  1000    PT Time Calculation (min)  40 min    Activity Tolerance  Patient tolerated treatment well    Behavior During Therapy  [facility] for tasks assessed/performed       Past Medical History:  Diagnosis Date  . Allergy   . Anemia   . Anxiety   . Arthritis   . Bladder incontinence   . Cancer ([place]) 2014   left breast   . Complication of anesthesia    "had trouble waking up"  . GERD (gastroesophageal reflux disease)   . Headache(784.0)    migraines  . High cholesterol   . Hypertension   . Personal history of radiation therapy 2014    Past Surgical History:  Procedure Laterality Date  . ABDOMINAL HYSTERECTOMY     COMPLETE   . ANTERIOR CERVICAL DECOMP/DISCECTOMY FUSION N/A 11/15/2018   Procedure: Anterior Cervical Decompression/Discectomy Fusion Cervical Three-Cervical Four, Cervical Four-Cervical Five, Cervical Five-Cervical Six;  Surgeon: Earnie Larsson, MD;  Location: [place];  Service: Neurosurgery;  Laterality: N/A;  Anterior Cervical Decompression/Discectomy Fusion Cervical Three-Cervical Four, Cervical Four-Cervical Five, Cervical Five-Cervical Six  . BACK SURGERY  2019  . BREAST EXCISIONAL BIOPSY Left    age 22...benign  . BREAST LUMPECTOMY Left 2014  . BREAST LUMPECTOMY WITH NEEDLE LOCALIZATION Left 07/11/2013   Procedure: BREAST LUMPECTOMY WITH NEEDLE LOCALIZATION;   Surgeon: Rolm Bookbinder, MD;  Location: [place];  Service: General;  Laterality: Left;  needle localization at [facility] 11:00   . BREAST SURGERY     LEFT BREAST LUMPECTOMY - BENIGN  . BUNIONECTOMY  1993   LEFT FOOT  . BUNIONECTOMY Right NOVEMBER 2013   HAMMER TOE  . NECK SURGERY  2002   DISC REPLACED IN NECK  . right knee athroscopy     2006  . TUBAL LIGATION      There were no vitals filed for this visit.   Subjective Assessment - 01/21/20 0938    Subjective  PT states that she has had bilateral knee pain with the right being greater than the left.  She has had arthroscopic surgery in 2006 but this did not really help with the pain.  She feels stable on flat ground but she feels that she can not trust it going up and down steps or an incline.  She got a shot of cortisone in her knee yesterday and her leg does not feel as stiff.  She is now being referred for skilled PT to include iontophoresis.    Pertinent History  HTN, cervical surgery, back pain, Lt  knee arthroscopic surgery    Limitations  Sitting;Standing;Walking    How long can you sit comfortably?  sitting is ok it is when she goes  to stand up she needs to stop for a few seconds before she can keep going .    How long can you stand comfortably?  no problem    How long can you walk comfortably?  able to walk for about 15 minutes at one time.    Patient Stated Goals  less pain, be able to walk further, feel stable going up and down steps, no pain sit to stand    Currently in Pain?  No/denies   going up and down steps and lying in bed worst is 10/10   Pain Score  0-No pain    Pain Orientation  Left;Anterior    Pain Descriptors / Indicators  Aching;Throbbing    Pain Type  Chronic pain    Pain Onset  More than a month ago    Pain Frequency  Intermittent    Aggravating Factors   steps    Pain Relieving Factors  rest    Effect of Pain on Daily Activities  limits         [facility] PT Assessment - 01/21/20 0001       Assessment   Medical Diagnosis  Rt knee pain     Referring Provider (PT)  Gerrit Halls    Onset Date/Surgical Date  12/08/19   acute exacerbation   Next MD Visit  02/27/2020    Prior Therapy  not for this       Precautions   Precautions  None      Restrictions   Weight Bearing Restrictions  Yes      Balance Screen   Has the patient fallen in the past 6 months  No    Has the patient had a decrease in activity level because of a fear of falling?   Yes    Is the patient reluctant to leave their home because of a fear of falling?   No      Home Film/video editor residence    Home Access  Stairs to enter    Entrance Stairs-Number of Steps  3      Prior Function   Level of Independence  Independent    Vocation  Full time employment    Vocation Requirements  standing, walking , lifting     Leisure  none       Cognition   Overall Cognitive Status  Within Functional Limits for tasks assessed      Observation/Other Assessments   Focus on Therapeutic Outcomes (FOTO)   49      Functional Tests   Functional tests  Single leg stance;Sit to Stand      Single Leg Stance   Comments  Lt: 60:   Rt:  20      Sit to Stand   Comments  8 in 30 seconds    feels shot helped and she would not be able to do it yesterd     ROM / Strength   AROM / PROM / Strength  AROM;Strength      AROM   AROM Assessment Site  Knee    Right/Left Knee  Right    Right Knee Extension  4    Right Knee Flexion  105      Strength   Strength Assessment Site  Hip;Knee;Ankle    Right/Left Hip  Right;Left    Right/Left Knee  Right;Left    Right Knee Extension  4/5    Left Knee Extension  5/5    Right/Left  Ankle  Right;Left                Objective measurements completed on examination: See above findings.      [facility] PT Treatment/Exercise - 01/21/20 0001      Exercises   Exercises  Knee/Hip      Knee/Hip Exercises: Supine   Quad Sets  Right;10 reps     Heel Slides  Right;10 reps      Modalities   Modalities  Iontophoresis      Iontophoresis   Type of Iontophoresis  Dexamethasone    Location  Rt inferior aspect of knee     Dose  4mA    Time  4 hr              PT Education - 01/21/20 1530    Education Details  HEP wear time for ionto.    Person(s) Educated  Patient    Methods  Explanation;Handout    Comprehension  Verbalized understanding;Returned demonstration       PT Short Term Goals - 01/21/20 1624      PT SHORT TERM GOAL #1   Title  Pt pain in her right knee  to be no greater than a 6/10 to allow pt to be able to complete an hour of light housework without difficulty    Time  2    Period  Weeks    Status  New    Target Date  02/04/20      PT SHORT TERM GOAL #2   Title  Pt Rt knee ROM to be at least 110 degrees to allow pt to be able to don an doff her shoes and socks with ease    Time  2    Period  Weeks    Status  New      PT SHORT TERM GOAL #3   Title  PT to be able to single leg stance on her right leg for 45 seconds to decrease risk of falling    Time  2    Period  Weeks    Status  Achieved        PT Long Term Goals - 01/21/20 1626      PT LONG TERM GOAL #1   Title  PT to be I in an advanced HEP to allow pt complete 12 steps in a reciprocal manner without increased pain.    Time  6    Period  Weeks    Status  New    Target Date  03/03/20      PT LONG TERM GOAL #2   Title  PT Rt knee ROM to be 120 degrees to allow pt to squat down to pick items off of the floor without difficulty.    Time  6    Period  Weeks    Status  New    Target Date  03/03/20      PT LONG TERM GOAL #3   Title  PT strength in her Rt LE to be increased one grade to be able raise herself up from a squatted position without increased pain.    Time  6    Period  Weeks    Status  New    Target Date  03/03/20      PT LONG TERM GOAL #4   Title  PT to be able to single leg stance for 60" on her Right to decrease risk of  falling    Time  6  Period  Weeks    Status  New             Plan - 01/21/20 1615    Clinical Impression Statement  Ms. Krauskopf is a 64 yo female who has had Rt knee issues for years but has had an increase in pain for the past several months.  She has been referred to skilled PT to decrease her pain and improve her functional ability.  Evaluation demonstrates decreased strength, decreased ROM, decreased balance and increased pain.  Ms. Iovine will benefit from skilled PT to address these issues and maximize her functional ability.    Personal Factors and Comorbidities  Past/Current Experience;Time since onset of injury/illness/exacerbation    Examination-Activity Limitations  Bend;Sit;Squat;Stairs;Locomotion Level;Transfers    Examination-Participation Restrictions  Cleaning;Community Activity;Yard Work    Stability/Clinical Decision Making  Stable/Uncomplicated    Clinical Decision Making  Low    Rehab Potential  Good    PT Frequency  3x / week    PT Duration  4 weeks    PT Treatment/Interventions  Aquatic Therapy;Iontophoresis 4mg /ml Dexamethasone;Cryotherapy;Gait training;Stair training;Therapeutic activities;Therapeutic exercise;Balance training;Patient/family education    PT Next Visit Plan  Begin standing TKR, SAQ, side stepping with t-band, heel raises progress strengthening as able, continue with iontophoresis    PT Home Exercise Plan  eval: quad set; heelslide.       Patient will benefit from skilled therapeutic intervention in order to improve the following deficits and impairments:  Decreased activity tolerance, Decreased balance, Decreased range of motion, Decreased strength, Pain, Difficulty walking  Visit Diagnosis: Chronic pain of right knee  Stiffness of right knee, not elsewhere classified  Difficulty in walking, not elsewhere classified     Problem List Patient Active Problem List   Diagnosis Date Noted  . Cervical myelopathy ([place]) 11/15/2018  .  Degenerative spondylolisthesis 01/28/2018  . Vulvar lesion 11/09/2016  . Vulvar atrophy 11/09/2016  . Cystocele 06/08/2014  . Cancer of central portion of left female breast ([place]) 07/21/2013  . Rectocele 06/05/2013  . Menopause 06/05/2013  . Vaginal atrophy 06/05/2013  . HTN (hypertension) 06/05/2013  . Other and unspecified hyperlipidemia 06/05/2013  . Stress incontinence in female 05/07/2012  . Female cystocele 05/07/2012  Rayetta Humphrey, PT CLT [phone] 01/21/2020, 4:35 PM  [place] [place] [address] [place], [place], [address] Phone: [phone]   Fax:  [phone]  Name: JOSETTA FEIG MRN: [id number] Date of Birth: Sep 04, 1956

## 2020-01-23 ENCOUNTER — Ambulatory Visit (HOSPITAL_COMMUNITY): Payer: BC Managed Care – PPO

## 2020-01-23 ENCOUNTER — Encounter (HOSPITAL_COMMUNITY): Payer: Self-pay

## 2020-01-23 ENCOUNTER — Other Ambulatory Visit: Payer: Self-pay

## 2020-01-23 DIAGNOSIS — G8929 Other chronic pain: Secondary | ICD-10-CM

## 2020-01-23 DIAGNOSIS — M25661 Stiffness of right knee, not elsewhere classified: Secondary | ICD-10-CM

## 2020-01-23 DIAGNOSIS — M25561 Pain in right knee: Secondary | ICD-10-CM | POA: Diagnosis not present

## 2020-01-23 DIAGNOSIS — R262 Difficulty in walking, not elsewhere classified: Secondary | ICD-10-CM

## 2020-01-23 NOTE — Patient Instructions (Signed)
Toe / Heel Raise (Standing)    Standing with support, raise heels, then rock back on heels and raise toes. Repeat 15 times.  Copyright  VHI. All rights reserved.   Short Arc [employer] a large can or rolled towel under leg. Straighten knee and leg. Hold 3-5 seconds. Repeat with other leg. Repeat 10 times. Do 2 sessions per day.  http://gt2.exer.us/366   Copyright  VHI. All rights reserved.   Functional Quadriceps: Sit to Stand    Sit on edge of chair, feet flat on floor. Stand upright, extending knees fully. Repeat 10 times per set. Do 2 sets per session. Do 2 sessions per day.  http://orth.exer.us/735   Copyright  VHI. All rights reserved.

## 2020-01-23 NOTE — Therapy (Signed)
[place], [place], [address] Phone: [phone]   Fax:  [phone]  Physical Therapy Treatment  Patient Details  Name: Cynthia Ferguson MRN: [id number] Date of Birth: 10-Nov-1955 Referring Provider (PT): Gerrit Halls   Encounter Date: 01/23/2020  PT End of Session - 01/23/20 0831    Visit Number  2    Number of Visits  12    Date for PT Re-Evaluation  02/20/20    Authorization Type  BCBS    Authorization - Visit Number  2    Authorization - Number of Visits  90    Progress Note Due on Visit  10    PT Start Time  0825    PT Stop Time  0908    PT Time Calculation (min)  43 min    Equipment Utilized During Treatment  --   SBA during sidestep   Activity Tolerance  Patient tolerated treatment well    Behavior During Therapy  [facility] for tasks assessed/performed       Past Medical History:  Diagnosis Date  . Allergy   . Anemia   . Anxiety   . Arthritis   . Bladder incontinence   . Cancer ([place]) 2014   left breast   . Complication of anesthesia    "had trouble waking up"  . GERD (gastroesophageal reflux disease)   . Headache(784.0)    migraines  . Ferguson cholesterol   . Hypertension   . Personal history of radiation therapy 2014    Past Surgical History:  Procedure Laterality Date  . ABDOMINAL HYSTERECTOMY     COMPLETE   . ANTERIOR CERVICAL DECOMP/DISCECTOMY FUSION N/A 11/15/2018   Procedure: Anterior Cervical Decompression/Discectomy Fusion Cervical Three-Cervical Four, Cervical Four-Cervical Five, Cervical Five-Cervical Six;  Surgeon: Earnie Larsson, MD;  Location: [place];  Service: Neurosurgery;  Laterality: N/A;  Anterior Cervical Decompression/Discectomy Fusion Cervical Three-Cervical Four, Cervical Four-Cervical Five, Cervical Five-Cervical Six  . BACK SURGERY  2019  . BREAST EXCISIONAL BIOPSY Left    age 64...benign  . BREAST LUMPECTOMY Left 2014  . BREAST LUMPECTOMY WITH NEEDLE LOCALIZATION Left  07/11/2013   Procedure: BREAST LUMPECTOMY WITH NEEDLE LOCALIZATION;  Surgeon: Rolm Bookbinder, MD;  Location: [place];  Service: General;  Laterality: Left;  needle localization at [facility] 11:00   . BREAST SURGERY     LEFT BREAST LUMPECTOMY - BENIGN  . BUNIONECTOMY  1993   LEFT FOOT  . BUNIONECTOMY Right NOVEMBER 2013   HAMMER TOE  . NECK SURGERY  2002   DISC REPLACED IN NECK  . right knee athroscopy     2006  . TUBAL LIGATION      There were no vitals filed for this visit.  Subjective Assessment - 01/23/20 0824    Subjective  Pt reoprts she had a ear ache yesterday but feels better today.  Reports knees are feeling good currently, stated it feels worse while climbing steps.  Reports ice helps with knee pain.    Pertinent History  HTN, cervical surgery, back pain, Lt  knee arthroscopic surgery    Patient Stated Goals  less pain, be able to walk further, feel stable going up and down steps, no pain sit to stand    Currently in Pain?  No/denies                       [facility] Adult PT Treatment/Exercise - 01/23/20 0001  Exercises   Exercises  Knee/Hip      Knee/Hip Exercises: Standing   Heel Raises  15 reps    Heel Raises Limitations  toe raises on slant    Terminal Knee Extension  10 reps;Theraband    Theraband Level (Terminal Knee Extension)  Level 2 (Red)    Other Standing Knee Exercises  Sidestep with RTB around thigh 2RT      Knee/Hip Exercises: Seated   Sit to Sand  10 reps;without UE support      Knee/Hip Exercises: Supine   Quad Sets  Right;10 reps    Short Arc Quad Sets  10 reps    Heel Slides  Right;10 reps      Modalities   Modalities  Iontophoresis      Iontophoresis   Type of Iontophoresis  Dexamethasone   #2   Location  Rt inferior aspect of knee     Dose  36mA    Time  4 hr              PT Education - 01/23/20 0837    Education Details  Reviewed goals, assured compliance with HEP, pt able to verbalize and  demonstrate appropriate form and mechanics with current HEP.    Person(s) Educated  Patient    Methods  Explanation;Demonstration    Comprehension  Verbalized understanding;Returned demonstration       PT Short Term Goals - 01/21/20 1624      PT SHORT TERM GOAL #1   Title  Pt pain in her right knee  to be no greater than a 6/10 to allow pt to be able to complete an hour of light housework without difficulty    Time  2    Period  Weeks    Status  New    Target Date  02/04/20      PT SHORT TERM GOAL #2   Title  Pt Rt knee ROM to be at least 110 degrees to allow pt to be able to don an doff her shoes and socks with ease    Time  2    Period  Weeks    Status  New      PT SHORT TERM GOAL #3   Title  PT to be able to single leg stance on her right leg for 45 seconds to decrease risk of falling    Time  2    Period  Weeks    Status  Achieved        PT Long Term Goals - 01/21/20 1626      PT LONG TERM GOAL #1   Title  PT to be I in an advanced HEP to allow pt complete 12 steps in a reciprocal manner without increased pain.    Time  6    Period  Weeks    Status  New    Target Date  03/03/20      PT LONG TERM GOAL #2   Title  PT Rt knee ROM to be 120 degrees to allow pt to squat down to pick items off of the floor without difficulty.    Time  6    Period  Weeks    Status  New    Target Date  03/03/20      PT LONG TERM GOAL #3   Title  PT strength in her Rt LE to be increased one grade to be able raise herself up from a squatted position without increased pain.  Time  6    Period  Weeks    Status  New    Target Date  03/03/20      PT LONG TERM GOAL #4   Title  PT to be able to single leg stance for 60" on her Right to decrease risk of falling    Time  6    Period  Weeks    Status  New            Plan - 01/23/20 [id number]    Clinical Impression Statement  Reviewed goals and assured compliance with HEP.  Pt able to verbalize and demonstrate appropriate mechanics  wiht current HEP with no additional cueing required.  Session focus with quad and hip strengthening and knee mobility.  Pt able to complete all exericcses with no reports of pain.  EOS applied iontopatch, reviewed appropriate wear time and to assess skin integrity upon removal of patch.    Personal Factors and Comorbidities  Past/Current Experience;Time since onset of injury/illness/exacerbation    Examination-Activity Limitations  Bend;Sit;Squat;Stairs;Locomotion Level;Transfers    Examination-Participation Restrictions  Cleaning;Community Activity;Yard Work    Stability/Clinical Decision Making  Stable/Uncomplicated    Clinical Decision Making  Low    Rehab Potential  Good    PT Frequency  3x / week    PT Duration  4 weeks    PT Treatment/Interventions  Aquatic Therapy;Iontophoresis 4mg /ml Dexamethasone;Cryotherapy;Gait training;Stair training;Therapeutic activities;Therapeutic exercise;Balance training;Patient/family education    PT Next Visit Plan  Begin step ups, balance training next session.  Progress strengthening as able.  Continue with iontophoresis.    PT Home Exercise Plan  eval: quad set; heelslide.; 01/23/20: SAQ, STS, heel/toe raises       Patient will benefit from skilled therapeutic intervention in order to improve the following deficits and impairments:  Decreased activity tolerance, Decreased balance, Decreased range of motion, Decreased strength, Pain, Difficulty walking  Visit Diagnosis: Stiffness of right knee, not elsewhere classified  Difficulty in walking, not elsewhere classified  Chronic pain of right knee     Problem List Patient Active Problem List   Diagnosis Date Noted  . Cervical myelopathy ([place]) 11/15/2018  . Degenerative spondylolisthesis 01/28/2018  . Vulvar lesion 11/09/2016  . Vulvar atrophy 11/09/2016  . Cystocele 06/08/2014  . Cancer of central portion of left female breast ([place]) 07/21/2013  . Rectocele 06/05/2013  . Menopause 06/05/2013  .  Vaginal atrophy 06/05/2013  . HTN (hypertension) 06/05/2013  . Other and unspecified hyperlipidemia 06/05/2013  . Stress incontinence in female 05/07/2012  . Female cystocele 05/07/2012   Ihor Austin, LPTA/CLT; CBIS [phone]  Aldona Lento 01/23/2020, 9:26 AM  [facility] [address] [place], [place], [address] Phone: [phone]   Fax:  [phone]  Name: Cynthia Ferguson MRN: [id number] Date of Birth: August 06, 1956

## 2020-01-27 ENCOUNTER — Ambulatory Visit (HOSPITAL_COMMUNITY): Payer: BC Managed Care – PPO

## 2020-01-27 ENCOUNTER — Encounter (HOSPITAL_COMMUNITY): Payer: Self-pay

## 2020-01-27 ENCOUNTER — Other Ambulatory Visit: Payer: Self-pay

## 2020-01-27 DIAGNOSIS — R262 Difficulty in walking, not elsewhere classified: Secondary | ICD-10-CM

## 2020-01-27 DIAGNOSIS — G8929 Other chronic pain: Secondary | ICD-10-CM

## 2020-01-27 DIAGNOSIS — M25561 Pain in right knee: Secondary | ICD-10-CM

## 2020-01-27 DIAGNOSIS — M25661 Stiffness of right knee, not elsewhere classified: Secondary | ICD-10-CM

## 2020-01-27 NOTE — Therapy (Signed)
[place], [place], [address] Phone: [phone]   Fax:  [phone]  Physical Therapy Treatment  Patient Details  Name: Cynthia Ferguson MRN: [id number] Date of Birth: May 19, 1956 Referring Provider (PT): Gerrit Halls   Encounter Date: 01/27/2020  PT End of Session - 01/27/20 0922    Visit Number  3    Number of Visits  12    Date for PT Re-Evaluation  02/20/20    Authorization Type  BCBS    Authorization - Visit Number  3    Authorization - Number of Visits  90    Progress Note Due on Visit  10    PT Start Time  0916    PT Stop Time  0958    PT Time Calculation (min)  42 min    Activity Tolerance  Patient tolerated treatment well    Behavior During Therapy  [facility] for tasks assessed/performed       Past Medical History:  Diagnosis Date  . Allergy   . Anemia   . Anxiety   . Arthritis   . Bladder incontinence   . Cancer ([place]) 2014   left breast   . Complication of anesthesia    "had trouble waking up"  . GERD (gastroesophageal reflux disease)   . Headache(784.0)    migraines  . High cholesterol   . Hypertension   . Personal history of radiation therapy 2014    Past Surgical History:  Procedure Laterality Date  . ABDOMINAL HYSTERECTOMY     COMPLETE   . ANTERIOR CERVICAL DECOMP/DISCECTOMY FUSION N/A 11/15/2018   Procedure: Anterior Cervical Decompression/Discectomy Fusion Cervical Three-Cervical Four, Cervical Four-Cervical Five, Cervical Five-Cervical Six;  Surgeon: Earnie Larsson, MD;  Location: [facility] [place];  Service: Neurosurgery;  Laterality: N/A;  Anterior Cervical Decompression/Discectomy Fusion Cervical Three-Cervical Four, Cervical Four-Cervical Five, Cervical Five-Cervical Six  . BACK SURGERY  2019  . BREAST EXCISIONAL BIOPSY Left    age 64...benign  . BREAST LUMPECTOMY Left 2014  . BREAST LUMPECTOMY WITH NEEDLE LOCALIZATION Left 07/11/2013   Procedure: BREAST LUMPECTOMY WITH NEEDLE LOCALIZATION;   Surgeon: Rolm Bookbinder, MD;  Location: [facility];  Service: General;  Laterality: Left;  needle localization at [facility] 11:00   . BREAST SURGERY     LEFT BREAST LUMPECTOMY - BENIGN  . BUNIONECTOMY  1993   LEFT FOOT  . BUNIONECTOMY Right NOVEMBER 2013   HAMMER TOE  . NECK SURGERY  2002   DISC REPLACED IN NECK  . right knee athroscopy     2006  . TUBAL LIGATION      There were no vitals filed for this visit.  Subjective Assessment - 01/27/20 0919    Subjective  Pt reports increased pain in her knee with steps and feels like it is catching, reoprts increased back pain.  Pt stated she doesnt notice any changes wiht the ionto.    Pertinent History  HTN, cervical surgery, back pain, Lt  knee arthroscopic surgery    How long can you walk comfortably?  able to walk for about 15 minutes at one time.    Patient Stated Goals  less pain, be able to walk further, feel stable going up and down steps, no pain sit to stand    Currently in Pain?  No/denies    Pain Score  --   Not bothering currently, reports increased pain in general   Pain Location  Knee    Pain Orientation  Right  Pain Onset  More than a month ago    Pain Frequency  Intermittent    Aggravating Factors   steps                       [facility] Adult PT Treatment/Exercise - 01/27/20 0001      Exercises   Exercises  Knee/Hip      Knee/Hip Exercises: Aerobic   Stationary Bike  3' for mobility seat 7      Knee/Hip Exercises: Standing   Heel Raises  15 reps    Heel Raises Limitations  toe raises on slant    Terminal Knee Extension  10 reps;Theraband    Theraband Level (Terminal Knee Extension)  Level 2 (Red)    SLS  Rt 24", Lt 27"     Other Standing Knee Exercises  Sidestep with RTB around thigh 2RT    Other Standing Knee Exercises  tandem stance 2x 30"      Knee/Hip Exercises: Supine   Quad Sets  Right;10 reps    Short Arc Quad Sets  10 reps    Heel Slides  Right;10 reps    Bridges  10 reps       Modalities   Modalities  Iontophoresis      Iontophoresis   Type of Iontophoresis  Dexamethasone   #3   Location  Rt inferior aspect of knee     Dose  13mA    Time  4 hr              PT Education - 01/27/20 [id number]    Education Details  Reviewed form wiht current HEP.  Encouraged increased application of ice following exercises.    Person(s) Educated  Patient    Methods  Explanation;Demonstration;Verbal cues    Comprehension  Verbalized understanding;Returned demonstration       PT Short Term Goals - 01/21/20 1624      PT SHORT TERM GOAL #1   Title  Pt pain in her right knee  to be no greater than a 6/10 to allow pt to be able to complete an hour of light housework without difficulty    Time  2    Period  Weeks    Status  New    Target Date  02/04/20      PT SHORT TERM GOAL #2   Title  Pt Rt knee ROM to be at least 110 degrees to allow pt to be able to don an doff her shoes and socks with ease    Time  2    Period  Weeks    Status  New      PT SHORT TERM GOAL #3   Title  PT to be able to single leg stance on her right leg for 45 seconds to decrease risk of falling    Time  2    Period  Weeks    Status  Achieved        PT Long Term Goals - 01/21/20 1626      PT LONG TERM GOAL #1   Title  PT to be I in an advanced HEP to allow pt complete 12 steps in a reciprocal manner without increased pain.    Time  6    Period  Weeks    Status  New    Target Date  03/03/20      PT LONG TERM GOAL #2   Title  PT Rt knee ROM to be 120  degrees to allow pt to squat down to pick items off of the floor without difficulty.    Time  6    Period  Weeks    Status  New    Target Date  03/03/20      PT LONG TERM GOAL #3   Title  PT strength in her Rt LE to be increased one grade to be able raise herself up from a squatted position without increased pain.    Time  6    Period  Weeks    Status  New    Target Date  03/03/20      PT LONG TERM GOAL #4   Title  PT to be  able to single leg stance for 60" on her Right to decrease risk of falling    Time  6    Period  Weeks    Status  New            Plan - 01/27/20 1001    Clinical Impression Statement  Added bike for knee mobility as well as balance and hip strengthening exercises to POC.  Pt able to complete all exercises with no reoprts of pain.  Did require cueing for posture through session to assist with balance.  Pt educated on importance of posture to reduce stress on anterior knee and reduce LBP wiht verbalized understanidng.  EOS applied iontopatch, reviewed appropriate wear time and to assess skin integrity upon removal of patch.    Personal Factors and Comorbidities  Past/Current Experience;Time since onset of injury/illness/exacerbation    Examination-Activity Limitations  Bend;Sit;Squat;Stairs;Locomotion Level;Transfers    Examination-Participation Restrictions  Cleaning;Community Activity;Yard Work    Stability/Clinical Decision Making  Stable/Uncomplicated    Clinical Decision Making  Low    Rehab Potential  Good    PT Frequency  3x / week    PT Duration  4 weeks    PT Treatment/Interventions  Aquatic Therapy;Iontophoresis 4mg /ml Dexamethasone;Cryotherapy;Gait training;Stair training;Therapeutic activities;Therapeutic exercise;Balance training;Patient/family education    PT Next Visit Plan  Begin step ups, balance training next session.  Progress strengthening as able.  Continue with iontophoresis.    PT Home Exercise Plan  eval: quad set; heelslide.; 01/23/20: SAQ, STS, heel/toe raises       Patient will benefit from skilled therapeutic intervention in order to improve the following deficits and impairments:  Decreased activity tolerance, Decreased balance, Decreased range of motion, Decreased strength, Pain, Difficulty walking  Visit Diagnosis: Difficulty in walking, not elsewhere classified  Chronic pain of right knee  Stiffness of right knee, not elsewhere  classified     Problem List Patient Active Problem List   Diagnosis Date Noted  . Cervical myelopathy ([place]) 11/15/2018  . Degenerative spondylolisthesis 01/28/2018  . Vulvar lesion 11/09/2016  . Vulvar atrophy 11/09/2016  . Cystocele 06/08/2014  . Cancer of central portion of left female breast ([place]) 07/21/2013  . Rectocele 06/05/2013  . Menopause 06/05/2013  . Vaginal atrophy 06/05/2013  . HTN (hypertension) 06/05/2013  . Other and unspecified hyperlipidemia 06/05/2013  . Stress incontinence in female 05/07/2012  . Female cystocele 05/07/2012   Ihor Austin, LPTA/CLT; CBIS [phone]  Aldona Lento 01/27/2020, 10:05 AM  [place], [place], [address] Phone: [phone]   Fax:  [phone]  Name: Cynthia Ferguson MRN: [id number] Date of Birth: Mar 15, 1956

## 2020-01-28 ENCOUNTER — Ambulatory Visit (HOSPITAL_COMMUNITY): Payer: BC Managed Care – PPO | Admitting: Physical Therapy

## 2020-01-28 ENCOUNTER — Encounter (HOSPITAL_COMMUNITY): Payer: Self-pay | Admitting: Physical Therapy

## 2020-01-28 DIAGNOSIS — M25661 Stiffness of right knee, not elsewhere classified: Secondary | ICD-10-CM

## 2020-01-28 DIAGNOSIS — G8929 Other chronic pain: Secondary | ICD-10-CM

## 2020-01-28 DIAGNOSIS — M25561 Pain in right knee: Secondary | ICD-10-CM | POA: Diagnosis not present

## 2020-01-28 DIAGNOSIS — R262 Difficulty in walking, not elsewhere classified: Secondary | ICD-10-CM

## 2020-01-28 NOTE — Therapy (Signed)
[place], [place], [address] Phone: [phone]   Fax:  [phone]  Physical Therapy Treatment  Patient Details  Name: Cynthia Ferguson MRN: [id number] Date of Birth: [birth year] Referring Provider (PT): Gerrit Halls   Encounter Date: 01/28/2020  PT End of Session - 01/28/20 1244    Visit Number  4    Number of Visits  12    Date for PT Re-Evaluation  02/20/20    Authorization Type  BCBS    Authorization - Visit Number  4    Authorization - Number of Visits  90    Progress Note Due on Visit  10    PT Start Time  0920    PT Stop Time  1000    PT Time Calculation (min)  40 min    Activity Tolerance  Patient tolerated treatment well    Behavior During Therapy  [facility] for tasks assessed/performed       Past Medical History:  Diagnosis Date  . Allergy   . Anemia   . Anxiety   . Arthritis   . Bladder incontinence   . Cancer ([place]) 2014   left breast   . Complication of anesthesia    "had trouble waking up"  . GERD (gastroesophageal reflux disease)   . Headache(784.0)    migraines  . High cholesterol   . Hypertension   . Personal history of radiation therapy 2014    Past Surgical History:  Procedure Laterality Date  . ABDOMINAL HYSTERECTOMY     COMPLETE   . ANTERIOR CERVICAL DECOMP/DISCECTOMY FUSION N/A 11/15/2018   Procedure: Anterior Cervical Decompression/Discectomy Fusion Cervical Three-Cervical Four, Cervical Four-Cervical Five, Cervical Five-Cervical Six;  Surgeon: Earnie Larsson, MD;  Location: [place];  Service: Neurosurgery;  Laterality: N/A;  Anterior Cervical Decompression/Discectomy Fusion Cervical Three-Cervical Four, Cervical Four-Cervical Five, Cervical Five-Cervical Six  . BACK SURGERY  2019  . BREAST EXCISIONAL BIOPSY Left    age 64...benign  . BREAST LUMPECTOMY Left 2014  . BREAST LUMPECTOMY WITH NEEDLE LOCALIZATION Left 07/11/2013   Procedure: BREAST LUMPECTOMY WITH NEEDLE LOCALIZATION;   Surgeon: Rolm Bookbinder, MD;  Location: [facility];  Service: General;  Laterality: Left;  needle localization at [facility] 11:00   . BREAST SURGERY     LEFT BREAST LUMPECTOMY - BENIGN  . BUNIONECTOMY  1993   LEFT FOOT  . BUNIONECTOMY Right NOVEMBER 2013   HAMMER TOE  . NECK SURGERY  2002   DISC REPLACED IN NECK  . right knee athroscopy     2006  . TUBAL LIGATION      There were no vitals filed for this visit.  Subjective Assessment - 01/28/20 0919    Subjective  Pt states that she is completing her HEP states that she is still feeling the catching in her knee.    Pertinent History  HTN, cervical surgery, back pain, Lt  knee arthroscopic surgery    How long can you walk comfortably?  able to walk for about 15 minutes at one time.    Patient Stated Goals  less pain, be able to walk further, feel stable going up and down steps, no pain sit to stand    Currently in Pain?  No/denies    Pain Location  Knee    Pain Onset  More than a month ago    Aggravating Factors   steps    Pain Relieving Factors  rest    Effect of Pain on  Daily Activities  limits                       [facility] Adult PT Treatment/Exercise - 01/28/20 0001      Exercises   Exercises  Knee/Hip      Knee/Hip Exercises: Stretches   Knee: Self-Stretch to increase Flexion  Both;3 reps;20 seconds      Knee/Hip Exercises: Aerobic   Nustep  level 2 hills 3 x 5"      Knee/Hip Exercises: Machines for Strengthening   Cybex Leg Press  3 PL x 15       Knee/Hip Exercises: Standing   Heel Raises  15 reps    Heel Raises Limitations  toe raises on slant    Terminal Knee Extension  Right;15 reps    Rocker Board  2 minutes    SLS  Rt27 ", Lt 34"     Other Standing Knee Exercises  Sidestep with RTB around thigh 2RT      Knee/Hip Exercises: Seated   Sit to Sand  10 reps;without UE support      Modalities   Modalities  Iontophoresis      Iontophoresis   Type of Iontophoresis  Dexamethasone    #3   Location  Rt inferior aspect of knee     Dose  72mA    Time  4 hr                PT Short Term Goals - 01/21/20 1624      PT SHORT TERM GOAL #1   Title  Pt pain in her right knee  to be no greater than a 6/10 to allow pt to be able to complete an hour of light housework without difficulty    Time  2    Period  Weeks    Status  New    Target Date  02/04/20      PT SHORT TERM GOAL #2   Title  Pt Rt knee ROM to be at least 110 degrees to allow pt to be able to don an doff her shoes and socks with ease    Time  2    Period  Weeks    Status  New      PT SHORT TERM GOAL #3   Title  PT to be able to single leg stance on her right leg for 45 seconds to decrease risk of falling    Time  2    Period  Weeks    Status  Achieved        PT Long Term Goals - 01/21/20 1626      PT LONG TERM GOAL #1   Title  PT to be I in an advanced HEP to allow pt complete 12 steps in a reciprocal manner without increased pain.    Time  6    Period  Weeks    Status  New    Target Date  03/03/20      PT LONG TERM GOAL #2   Title  PT Rt knee ROM to be 120 degrees to allow pt to squat down to pick items off of the floor without difficulty.    Time  6    Period  Weeks    Status  New    Target Date  03/03/20      PT LONG TERM GOAL #3   Title  PT strength in her Rt LE to be increased one grade to  be able raise herself up from a squatted position without increased pain.    Time  6    Period  Weeks    Status  New    Target Date  03/03/20      PT LONG TERM GOAL #4   Title  PT to be able to single leg stance for 60" on her Right to decrease risk of falling    Time  6    Period  Weeks    Status  New            Plan - 01/28/20 1246    Clinical Impression Statement  Pt had exercises updated to address weakened mm.  PT needs verbal cuing for correct form with exercises.    Personal Factors and Comorbidities  Past/Current Experience;Time since onset of injury/illness/exacerbation     Examination-Activity Limitations  Bend;Sit;Squat;Stairs;Locomotion Level;Transfers    Examination-Participation Restrictions  Cleaning;Community Activity;Yard Work    Stability/Clinical Decision Making  Stable/Uncomplicated    Rehab Potential  Good    PT Frequency  3x / week    PT Duration  4 weeks    PT Treatment/Interventions  Aquatic Therapy;Iontophoresis 4mg /ml Dexamethasone;Cryotherapy;Gait training;Stair training;Therapeutic activities;Therapeutic exercise;Balance training;Patient/family education    PT Next Visit Plan  Begin step ups,.  Progress strengthening as able.  Continue with iontophoresis.    PT Home Exercise Plan  eval: quad set; heelslide.; 01/23/20: SAQ, STS, heel/toe raises       Patient will benefit from skilled therapeutic intervention in order to improve the following deficits and impairments:  Decreased activity tolerance, Decreased balance, Decreased range of motion, Decreased strength, Pain, Difficulty walking  Visit Diagnosis: Difficulty in walking, not elsewhere classified  Chronic pain of right knee  Stiffness of right knee, not elsewhere classified     Problem List Patient Active Problem List   Diagnosis Date Noted  . Cervical myelopathy ([place]) 11/15/2018  . Degenerative spondylolisthesis 01/28/2018  . Vulvar lesion 11/09/2016  . Vulvar atrophy 11/09/2016  . Cystocele 06/08/2014  . Cancer of central portion of left female breast ([place]) 07/21/2013  . Rectocele 06/05/2013  . Menopause 06/05/2013  . Vaginal atrophy 06/05/2013  . HTN (hypertension) 06/05/2013  . Other and unspecified hyperlipidemia 06/05/2013  . Stress incontinence in female 05/07/2012  . Female cystocele 05/07/2012   Rayetta Humphrey, PT CLT [phone] 01/28/2020, 12:47 PM  [facility] [address] [place], [place], [address] Phone: [phone]   Fax:  [phone]  Name: TONEKA BURES MRN: [id number] Date of Birth: [birth year]

## 2020-01-30 ENCOUNTER — Encounter (HOSPITAL_COMMUNITY): Payer: Self-pay | Admitting: Physical Therapy

## 2020-01-30 ENCOUNTER — Ambulatory Visit (HOSPITAL_COMMUNITY): Payer: BC Managed Care – PPO | Admitting: Physical Therapy

## 2020-01-30 ENCOUNTER — Other Ambulatory Visit: Payer: Self-pay

## 2020-01-30 DIAGNOSIS — M25661 Stiffness of right knee, not elsewhere classified: Secondary | ICD-10-CM

## 2020-01-30 DIAGNOSIS — R262 Difficulty in walking, not elsewhere classified: Secondary | ICD-10-CM

## 2020-01-30 DIAGNOSIS — M25561 Pain in right knee: Secondary | ICD-10-CM | POA: Diagnosis not present

## 2020-01-30 DIAGNOSIS — G8929 Other chronic pain: Secondary | ICD-10-CM

## 2020-01-30 NOTE — Therapy (Signed)
[place], [place], [address] Phone: [phone]   Fax:  [phone]  Physical Therapy Treatment  Patient Details  Name: Cynthia Ferguson MRN: [id number] Date of Birth: [birth year] Referring Provider (PT): Gerrit Halls   Encounter Date: 01/30/2020  PT End of Session - 01/30/20 0930    Visit Number  5    Number of Visits  12    Date for PT Re-Evaluation  02/20/20    Authorization Type  BCBS    Authorization - Visit Number  5    Authorization - Number of Visits  90    Progress Note Due on Visit  10    PT Start Time  0830    PT Stop Time  0920    PT Time Calculation (min)  50 min    Activity Tolerance  Patient tolerated treatment well    Behavior During Therapy  [facility] for tasks assessed/performed       Past Medical History:  Diagnosis Date  . Allergy   . Anemia   . Anxiety   . Arthritis   . Bladder incontinence   . Cancer ([place]) 2014   left breast   . Complication of anesthesia    "had trouble waking up"  . GERD (gastroesophageal reflux disease)   . Headache(784.0)    migraines  . High cholesterol   . Hypertension   . Personal history of radiation therapy 2014    Past Surgical History:  Procedure Laterality Date  . ABDOMINAL HYSTERECTOMY     COMPLETE   . ANTERIOR CERVICAL DECOMP/DISCECTOMY FUSION N/A 11/15/2018   Procedure: Anterior Cervical Decompression/Discectomy Fusion Cervical Three-Cervical Four, Cervical Four-Cervical Five, Cervical Five-Cervical Six;  Surgeon: Earnie Larsson, MD;  Location: [facility];  Service: Neurosurgery;  Laterality: N/A;  Anterior Cervical Decompression/Discectomy Fusion Cervical Three-Cervical Four, Cervical Four-Cervical Five, Cervical Five-Cervical Six  . BACK SURGERY  2019  . BREAST EXCISIONAL BIOPSY Left    age 34...benign  . BREAST LUMPECTOMY Left 2014  . BREAST LUMPECTOMY WITH NEEDLE LOCALIZATION Left 07/11/2013   Procedure: BREAST LUMPECTOMY WITH NEEDLE LOCALIZATION;   Surgeon: Rolm Bookbinder, MD;  Location: [facility];  Service: General;  Laterality: Left;  needle localization at [facility] 11:00   . BREAST SURGERY     LEFT BREAST LUMPECTOMY - BENIGN  . BUNIONECTOMY  1993   LEFT FOOT  . BUNIONECTOMY Right NOVEMBER 2013   HAMMER TOE  . NECK SURGERY  2002   DISC REPLACED IN NECK  . right knee athroscopy     2006  . TUBAL LIGATION      There were no vitals filed for this visit.  Subjective Assessment - 01/30/20 0829    Subjective  PT states that her knee is feeling pretty good today.    Pertinent History  HTN, cervical surgery, back pain, Lt  knee arthroscopic surgery    How long can you walk comfortably?  able to walk for about 15 minutes at one time.    Patient Stated Goals  less pain, be able to walk further, feel stable going up and down steps, no pain sit to stand    Pain Onset  More than a month ago                       [facility] Adult PT Treatment/Exercise - 01/30/20 0001      Exercises   Exercises  Knee/Hip      Knee/Hip Exercises:  Stretches   Knee: Self-Stretch to increase Flexion  Both;3 reps;20 seconds      Knee/Hip Exercises: Aerobic   Stationary Bike  4'     Nustep  level 2 hills 3 x 5"      Knee/Hip Exercises: Machines for Strengthening   Cybex Leg Press  4 PL x 15       Knee/Hip Exercises: Standing   Heel Raises  15 reps    Heel Raises Limitations  --    Knee Flexion  Strengthening;Right;10 reps    Knee Flexion Limitations  3#    Terminal Knee Extension  Right;15 reps    Lateral Step Up  Both;10 reps;Step Height: 4"    Forward Step Up  Both;10 reps;Step Height: 4"    Functional Squat  10 reps    Functional Squat Limitations  mini due to severe crepitus     Rocker Board  2 minutes    SLS  Rt 35 ", Lt 10"     Other Standing Knee Exercises  Sidestep with RTB around thigh 2RT      Knee/Hip Exercises: Seated   Sit to Sand  10 reps;without UE support      Modalities   Modalities  Iontophoresis       Iontophoresis   Type of Iontophoresis  Dexamethasone   #3   Location  Rt inferior aspect of knee     Dose  34mA    Time  4 hr                PT Short Term Goals - 01/21/20 1624      PT SHORT TERM GOAL #1   Title  Pt pain in her right knee  to be no greater than a 6/10 to allow pt to be able to complete an hour of light housework without difficulty    Time  2    Period  Weeks    Status  New    Target Date  02/04/20      PT SHORT TERM GOAL #2   Title  Pt Rt knee ROM to be at least 110 degrees to allow pt to be able to don an doff her shoes and socks with ease    Time  2    Period  Weeks    Status  New      PT SHORT TERM GOAL #3   Title  PT to be able to single leg stance on her right leg for 45 seconds to decrease risk of falling    Time  2    Period  Weeks    Status  Achieved        PT Long Term Goals - 01/21/20 1626      PT LONG TERM GOAL #1   Title  PT to be I in an advanced HEP to allow pt complete 12 steps in a reciprocal manner without increased pain.    Time  6    Period  Weeks    Status  New    Target Date  03/03/20      PT LONG TERM GOAL #2   Title  PT Rt knee ROM to be 120 degrees to allow pt to squat down to pick items off of the floor without difficulty.    Time  6    Period  Weeks    Status  New    Target Date  03/03/20      PT LONG TERM GOAL #3  Title  PT strength in her Rt LE to be increased one grade to be able raise herself up from a squatted position without increased pain.    Time  6    Period  Weeks    Status  New    Target Date  03/03/20      PT LONG TERM GOAL #4   Title  PT to be able to single leg stance for 60" on her Right to decrease risk of falling    Time  6    Period  Weeks    Status  New            Plan - 01/30/20 0930    Clinical Impression Statement  Pt needs continuous verbal cuing for correct posturing while completing execises.  Therapist added standing knee flexion and step ups as well as functional  squats to improve strength.  Noted significant crepitus B with functional squats therefore they were modified to mini ,(20 degree flexion) squats.    Personal Factors and Comorbidities  Past/Current Experience;Time since onset of injury/illness/exacerbation    Examination-Activity Limitations  Bend;Sit;Squat;Stairs;Locomotion Level;Transfers    Examination-Participation Restrictions  Cleaning;Community Activity;Yard Work    Stability/Clinical Decision Making  Stable/Uncomplicated    Rehab Potential  Good    PT Frequency  3x / week    PT Duration  4 weeks    PT Treatment/Interventions  Aquatic Therapy;Iontophoresis 4mg /ml Dexamethasone;Cryotherapy;Gait training;Stair training;Therapeutic activities;Therapeutic exercise;Balance training;Patient/family education    PT Next Visit Plan  Progress strengthening as able.  One more iontophoresis treatment.    PT Home Exercise Plan  eval: quad set; heelslide.; 01/23/20: SAQ, STS, heel/toe raises       Patient will benefit from skilled therapeutic intervention in order to improve the following deficits and impairments:  Decreased activity tolerance, Decreased balance, Decreased range of motion, Decreased strength, Pain, Difficulty walking  Visit Diagnosis: Difficulty in walking, not elsewhere classified  Chronic pain of right knee  Stiffness of right knee, not elsewhere classified     Problem List Patient Active Problem List   Diagnosis Date Noted  . Cervical myelopathy ([place]) 11/15/2018  . Degenerative spondylolisthesis 01/28/2018  . Vulvar lesion 11/09/2016  . Vulvar atrophy 11/09/2016  . Cystocele 06/08/2014  . Cancer of central portion of left female breast ([place]) 07/21/2013  . Rectocele 06/05/2013  . Menopause 06/05/2013  . Vaginal atrophy 06/05/2013  . HTN (hypertension) 06/05/2013  . Other and unspecified hyperlipidemia 06/05/2013  . Stress incontinence in female 05/07/2012  . Female cystocele 05/07/2012    Rayetta Humphrey, PT  CLT [phone] 01/30/2020, 9:33 AM  [facility] [address] [place], [place], [address] Phone: [phone]   Fax:  [phone]  Name: Cynthia Ferguson MRN: [id number] Date of Birth: 12-11-1955

## 2020-02-03 ENCOUNTER — Encounter (HOSPITAL_COMMUNITY): Payer: Self-pay | Admitting: Physical Therapy

## 2020-02-03 ENCOUNTER — Other Ambulatory Visit: Payer: Self-pay

## 2020-02-03 ENCOUNTER — Ambulatory Visit (HOSPITAL_COMMUNITY): Payer: BC Managed Care – PPO | Admitting: Physical Therapy

## 2020-02-03 DIAGNOSIS — M25661 Stiffness of right knee, not elsewhere classified: Secondary | ICD-10-CM

## 2020-02-03 DIAGNOSIS — G8929 Other chronic pain: Secondary | ICD-10-CM

## 2020-02-03 DIAGNOSIS — M25561 Pain in right knee: Secondary | ICD-10-CM | POA: Diagnosis not present

## 2020-02-03 DIAGNOSIS — R262 Difficulty in walking, not elsewhere classified: Secondary | ICD-10-CM

## 2020-02-03 NOTE — Therapy (Signed)
[place], [place], [address] Phone: [phone]   Fax:  [phone]  Physical Therapy Treatment  Patient Details  Name: Cynthia Ferguson MRN: [id number] Date of Birth: 1956-09-02 Referring Provider (PT): Gerrit Halls   Encounter Date: 02/03/2020  PT End of Session - 02/03/20 0926    Visit Number  6    Number of Visits  12    Date for PT Re-Evaluation  02/20/20    Authorization Type  BCBS    Authorization - Visit Number  6    Authorization - Number of Visits  90    Progress Note Due on Visit  10    PT Start Time  0832    PT Stop Time  0925   no charge for nustep   PT Time Calculation (min)  53 min    Activity Tolerance  Patient tolerated treatment well    Behavior During Therapy  [facility] for tasks assessed/performed       Past Medical History:  Diagnosis Date  . Allergy   . Anemia   . Anxiety   . Arthritis   . Bladder incontinence   . Cancer ([place]) 2014   left breast   . Complication of anesthesia    "had trouble waking up"  . GERD (gastroesophageal reflux disease)   . Headache(784.0)    migraines  . High cholesterol   . Hypertension   . Personal history of radiation therapy 2014    Past Surgical History:  Procedure Laterality Date  . ABDOMINAL HYSTERECTOMY     COMPLETE   . ANTERIOR CERVICAL DECOMP/DISCECTOMY FUSION N/A 11/15/2018   Procedure: Anterior Cervical Decompression/Discectomy Fusion Cervical Three-Cervical Four, Cervical Four-Cervical Five, Cervical Five-Cervical Six;  Surgeon: Earnie Larsson, MD;  Location: [place];  Service: Neurosurgery;  Laterality: N/A;  Anterior Cervical Decompression/Discectomy Fusion Cervical Three-Cervical Four, Cervical Four-Cervical Five, Cervical Five-Cervical Six  . BACK SURGERY  2019  . BREAST EXCISIONAL BIOPSY Left    age 57...benign  . BREAST LUMPECTOMY Left 2014  . BREAST LUMPECTOMY WITH NEEDLE LOCALIZATION Left 07/11/2013   Procedure: BREAST LUMPECTOMY WITH  NEEDLE LOCALIZATION;  Surgeon: Rolm Bookbinder, MD;  Location: [place];  Service: General;  Laterality: Left;  needle localization at [facility] 11:00   . BREAST SURGERY     LEFT BREAST LUMPECTOMY - BENIGN  . BUNIONECTOMY  1993   LEFT FOOT  . BUNIONECTOMY Right NOVEMBER 2013   HAMMER TOE  . NECK SURGERY  2002   DISC REPLACED IN NECK  . right knee athroscopy     2006  . TUBAL LIGATION      There were no vitals filed for this visit.  Subjective Assessment - 02/03/20 0831    Subjective  Pt states that she is doing her exercises once a day. There is not much change in her knee.    Pertinent History  HTN, cervical surgery, back pain, Lt  knee arthroscopic surgery    How long can you walk comfortably?  able to walk for about 15 minutes at one time.    Patient Stated Goals  less pain, be able to walk further, feel stable going up and down steps, no pain sit to stand    Currently in Pain?  No/denies    Pain Score  0-No pain    Pain Location  Knee    Pain Onset  More than a month ago  [facility] Adult PT Treatment/Exercise - 02/03/20 0001      Exercises   Exercises  Knee/Hip      Knee/Hip Exercises: Stretches   Knee: Self-Stretch to increase Flexion  Both;3 reps;20 seconds      Knee/Hip Exercises: Aerobic   Stationary Bike  --    Nustep  level 2 hills 3 x 8"      Knee/Hip Exercises: Machines for Strengthening   Cybex Leg Press  4 PL x 15       Knee/Hip Exercises: Standing   Heel Raises  15 reps    Knee Flexion  Strengthening;Right;10 reps    Knee Flexion Limitations  4#    Terminal Knee Extension  Both;15 reps    Lateral Step Up  Both;10 reps;Step Height: 4"    Forward Step Up  Both;15 reps;Step Height: 4"    Functional Squat  10 reps    Functional Squat Limitations  mini due to severe crepitus     Rocker Board  2 minutes    SLS  Rt 40 ", Lt 50"     Other Standing Knee Exercises  Sidestep with RTB around thigh 2RT       Knee/Hip Exercises: Seated   Sit to Sand  10 reps;without UE support      Modalities   Modalities  Iontophoresis      Iontophoresis   Type of Iontophoresis  Dexamethasone   #3   Location  Rt inferior aspect of knee     Dose  66mA    Time  4 hr                PT Short Term Goals - 02/03/20 0929      PT SHORT TERM GOAL #1   Title  Pt pain in her right knee  to be no greater than a 6/10 to allow pt to be able to complete an hour of light housework without difficulty    Time  2    Period  Weeks    Status  On-going    Target Date  02/04/20      PT SHORT TERM GOAL #2   Title  Pt Rt knee ROM to be at least 110 degrees to allow pt to be able to don an doff her shoes and socks with ease    Time  2    Period  Weeks    Status  On-going      PT SHORT TERM GOAL #3   Title  PT to be able to single leg stance on her right leg for 45 seconds to decrease risk of falling    Time  2    Period  Weeks    Status  On-going        PT Long Term Goals - 02/03/20 0930      PT LONG TERM GOAL #1   Title  PT to be I in an advanced HEP to allow pt complete 12 steps in a reciprocal manner without increased pain.    Time  6    Period  Weeks    Status  On-going      PT LONG TERM GOAL #2   Title  PT Rt knee ROM to be 120 degrees to allow pt to squat down to pick items off of the floor without difficulty.    Time  6    Period  Weeks    Status  On-going      PT LONG TERM GOAL #3  Title  PT strength in her Rt LE to be increased one grade to be able raise herself up from a squatted position without increased pain.    Time  6    Period  Weeks    Status  On-going      PT LONG TERM GOAL #4   Title  PT to be able to single leg stance for 60" on her Right to decrease risk of falling    Time  6    Period  Weeks    Status  On-going            Plan - 02/03/20 [id number]    Clinical Impression Statement  Pt with better form with exercises today. Pt able to modifiy most exercises on her  own without needing cuing if noted increased crepitus with activity.  Pt with noted improvement with SLS.  Able to increase repetitions this session due to improved sx.    Personal Factors and Comorbidities  Past/Current Experience;Time since onset of injury/illness/exacerbation    Examination-Activity Limitations  Bend;Sit;Squat;Stairs;Locomotion Level;Transfers    Examination-Participation Restrictions  Cleaning;Community Activity;Yard Work    Stability/Clinical Decision Making  Stable/Uncomplicated    Rehab Potential  Good    PT Frequency  3x / week    PT Duration  4 weeks    PT Treatment/Interventions  Aquatic Therapy;Iontophoresis 4mg /ml Dexamethasone;Cryotherapy;Gait training;Stair training;Therapeutic activities;Therapeutic exercise;Balance training;Patient/family education    PT Next Visit Plan  Increased step ups to 6" height, add wall squat.  One more iontophoresis treatment.    PT Home Exercise Plan  eval: quad set; heelslide.; 01/23/20: SAQ, STS, heel/toe raises       Patient will benefit from skilled therapeutic intervention in order to improve the following deficits and impairments:  Decreased activity tolerance, Decreased balance, Decreased range of motion, Decreased strength, Pain, Difficulty walking  Visit Diagnosis: Difficulty in walking, not elsewhere classified  Chronic pain of right knee  Stiffness of right knee, not elsewhere classified     Problem List Patient Active Problem List   Diagnosis Date Noted  . Cervical myelopathy ([place]) 11/15/2018  . Degenerative spondylolisthesis 01/28/2018  . Vulvar lesion 11/09/2016  . Vulvar atrophy 11/09/2016  . Cystocele 06/08/2014  . Cancer of central portion of left female breast ([place]) 07/21/2013  . Rectocele 06/05/2013  . Menopause 06/05/2013  . Vaginal atrophy 06/05/2013  . HTN (hypertension) 06/05/2013  . Other and unspecified hyperlipidemia 06/05/2013  . Stress incontinence in female 05/07/2012  . Female cystocele  05/07/2012   Rayetta Humphrey, PT CLT [phone] 02/03/2020, 9:31 AM  [place] [address] [place], [place], [address] Phone: [phone]   Fax:  [phone]  Name: LASHINA BOSHER MRN: [id number] Date of Birth: Feb 04, 1956

## 2020-02-05 ENCOUNTER — Encounter (HOSPITAL_COMMUNITY): Payer: Self-pay | Admitting: Physical Therapy

## 2020-02-05 ENCOUNTER — Ambulatory Visit (HOSPITAL_COMMUNITY): Payer: BC Managed Care – PPO | Attending: Physician Assistant | Admitting: Physical Therapy

## 2020-02-05 ENCOUNTER — Other Ambulatory Visit: Payer: Self-pay

## 2020-02-05 DIAGNOSIS — M25661 Stiffness of right knee, not elsewhere classified: Secondary | ICD-10-CM | POA: Diagnosis present

## 2020-02-05 DIAGNOSIS — G8929 Other chronic pain: Secondary | ICD-10-CM | POA: Diagnosis present

## 2020-02-05 DIAGNOSIS — R262 Difficulty in walking, not elsewhere classified: Secondary | ICD-10-CM | POA: Diagnosis not present

## 2020-02-05 DIAGNOSIS — M25561 Pain in right knee: Secondary | ICD-10-CM | POA: Insufficient documentation

## 2020-02-05 NOTE — Therapy (Signed)
[name], [place], [address] Phone: [phone]   Fax:  [phone]  Physical Therapy Treatment  Patient Details  Name: Cynthia Ferguson MRN: [id number] Date of Birth: July 18, 1956 Referring Provider (PT): Gerrit Halls   Encounter Date: 02/05/2020  PT End of Session - 02/05/20 0955    Visit Number  7    Number of Visits  12    Date for PT Re-Evaluation  02/20/20    Authorization Type  BCBS    Authorization - Visit Number  7    Authorization - Number of Visits  90    Progress Note Due on Visit  10    PT Start Time  0916    PT Stop Time  1000    PT Time Calculation (min)  44 min    Activity Tolerance  Patient tolerated treatment well    Behavior During Therapy  [facility] for tasks assessed/performed       Past Medical History:  Diagnosis Date  . Allergy   . Anemia   . Anxiety   . Arthritis   . Bladder incontinence   . Cancer ([place]) 2014   left breast   . Complication of anesthesia    "had trouble waking up"  . GERD (gastroesophageal reflux disease)   . Headache(784.0)    migraines  . High cholesterol   . Hypertension   . Personal history of radiation therapy 2014    Past Surgical History:  Procedure Laterality Date  . ABDOMINAL HYSTERECTOMY     COMPLETE   . ANTERIOR CERVICAL DECOMP/DISCECTOMY FUSION N/A 11/15/2018   Procedure: Anterior Cervical Decompression/Discectomy Fusion Cervical Three-Cervical Four, Cervical Four-Cervical Five, Cervical Five-Cervical Six;  Surgeon: Earnie Larsson, MD;  Location: [place];  Service: Neurosurgery;  Laterality: N/A;  Anterior Cervical Decompression/Discectomy Fusion Cervical Three-Cervical Four, Cervical Four-Cervical Five, Cervical Five-Cervical Six  . BACK SURGERY  2019  . BREAST EXCISIONAL BIOPSY Left    age 64...benign  . BREAST LUMPECTOMY Left 2014  . BREAST LUMPECTOMY WITH NEEDLE LOCALIZATION Left 07/11/2013   Procedure: BREAST LUMPECTOMY WITH NEEDLE LOCALIZATION;   Surgeon: Rolm Bookbinder, MD;  Location: [place];  Service: General;  Laterality: Left;  needle localization at [facility] 11:00   . BREAST SURGERY     LEFT BREAST LUMPECTOMY - BENIGN  . BUNIONECTOMY  1993   LEFT FOOT  . BUNIONECTOMY Right NOVEMBER 2013   HAMMER TOE  . NECK SURGERY  2002   DISC REPLACED IN NECK  . right knee athroscopy     2006  . TUBAL LIGATION      There were no vitals filed for this visit.  Subjective Assessment - 02/05/20 0916    Subjective  PT states that she is sore due to having a cramp in her leg.    Pertinent History  HTN, cervical surgery, back pain, Lt  knee arthroscopic surgery    Limitations  Sitting;Standing;Walking    How long can you sit comfortably?  can sit for two hours    How long can you stand comfortably?  no problem    How long can you walk comfortably?  able to walk for about 15 minutes at one time.    Patient Stated Goals  less pain, be able to walk further, feel stable going up and down steps, no pain sit to stand    Currently in Pain?  Yes    Pain Score  4     Pain Location  Knee    Pain Orientation  Right    Pain Descriptors / Indicators  Aching;Throbbing    Pain Type  Acute pain    Pain Onset  More than a month ago    Pain Frequency  Intermittent    Aggravating Factors   weight bearing    Pain Relieving Factors  rest    Effect of Pain on Daily Activities  limits                       [facility] Adult PT Treatment/Exercise - 02/05/20 0001      Exercises   Exercises  Knee/Hip      Knee/Hip Exercises: Stretches   Knee: Self-Stretch to increase Flexion  Both;3 reps;20 seconds      Knee/Hip Exercises: Aerobic   Nustep  level 2 hills 3 x 8"      Knee/Hip Exercises: Machines for Strengthening   Cybex Leg Press  4 PL x 15       Knee/Hip Exercises: Standing   Heel Raises  15 reps    Knee Flexion  Strengthening;Right;10 reps    Knee Flexion Limitations  5#    Terminal Knee Extension  Both;15 reps     Lateral Step Up  Both;10 reps;Step Height: 6"    Forward Step Up  Both;15 reps;Step Height: 6"    Functional Squat  10 reps    Functional Squat Limitations  mini due to severe crepitus     Wall Squat  10 reps    Rocker Board  2 minutes    SLS  --    SLS with Vectors  10 seconds x 3     Other Standing Knee Exercises  Sidestep with RTB around thigh 2RT      Knee/Hip Exercises: Seated   Sit to Sand  --      Modalities   Modalities  Iontophoresis      Iontophoresis   Type of Iontophoresis  Dexamethasone   #3   Location  Rt inferior aspect of knee     Dose  26mA    Time  4 hr                PT Short Term Goals - 02/03/20 0929      PT SHORT TERM GOAL #1   Title  Pt pain in her right knee  to be no greater than a 6/10 to allow pt to be able to complete an hour of light housework without difficulty    Time  2    Period  Weeks    Status  On-going    Target Date  02/04/20      PT SHORT TERM GOAL #2   Title  Pt Rt knee ROM to be at least 110 degrees to allow pt to be able to don an doff her shoes and socks with ease    Time  2    Period  Weeks    Status  On-going      PT SHORT TERM GOAL #3   Title  PT to be able to single leg stance on her right leg for 45 seconds to decrease risk of falling    Time  2    Period  Weeks    Status  On-going        PT Long Term Goals - 02/03/20 0930      PT LONG TERM GOAL #1   Title  PT to be I in an  advanced HEP to allow pt complete 12 steps in a reciprocal manner without increased pain.    Time  6    Period  Weeks    Status  On-going      PT LONG TERM GOAL #2   Title  PT Rt knee ROM to be 120 degrees to allow pt to squat down to pick items off of the floor without difficulty.    Time  6    Period  Weeks    Status  On-going      PT LONG TERM GOAL #3   Title  PT strength in her Rt LE to be increased one grade to be able raise herself up from a squatted position without increased pain.    Time  6    Period  Weeks    Status   On-going      PT LONG TERM GOAL #4   Title  PT to be able to single leg stance for 60" on her Right to decrease risk of falling    Time  6    Period  Weeks    Status  On-going            Plan - 02/05/20 [id number]    Clinical Impression Statement  Added wall squat and increased step ups to 6" today without difficulty.  PT exhibiting improved self correction for proper technique of exercises.    Personal Factors and Comorbidities  Past/Current Experience;Time since onset of injury/illness/exacerbation    Examination-Activity Limitations  Bend;Sit;Squat;Stairs;Locomotion Level;Transfers    Examination-Participation Restrictions  Cleaning;Community Activity;Yard Work    Stability/Clinical Decision Making  Stable/Uncomplicated    Rehab Potential  Good    PT Frequency  3x / week    PT Duration  4 weeks    PT Treatment/Interventions  Aquatic Therapy;Iontophoresis 4mg /ml Dexamethasone;Cryotherapy;Gait training;Stair training;Therapeutic activities;Therapeutic exercise;Balance training;Patient/family education    PT Next Visit Plan  begin lunging    PT Home Exercise Plan  eval: quad set; heelslide.; 01/23/20: SAQ, STS, heel/toe raises       Patient will benefit from skilled therapeutic intervention in order to improve the following deficits and impairments:  Decreased activity tolerance, Decreased balance, Decreased range of motion, Decreased strength, Pain, Difficulty walking  Visit Diagnosis: Difficulty in walking, not elsewhere classified  Chronic pain of right knee  Stiffness of right knee, not elsewhere classified     Problem List Patient Active Problem List   Diagnosis Date Noted  . Cervical myelopathy ([place]) 11/15/2018  . Degenerative spondylolisthesis 01/28/2018  . Vulvar lesion 11/09/2016  . Vulvar atrophy 11/09/2016  . Cystocele 06/08/2014  . Cancer of central portion of left female breast ([place]) 07/21/2013  . Rectocele 06/05/2013  . Menopause 06/05/2013  . Vaginal  atrophy 06/05/2013  . HTN (hypertension) 06/05/2013  . Other and unspecified hyperlipidemia 06/05/2013  . Stress incontinence in female 05/07/2012  . Female cystocele 05/07/2012    Rayetta Humphrey, PT CLT [phone] 02/05/2020, 10:03 AM  [facility] [address] [place], [place], [address] Phone: [phone]   Fax:  [phone]  Name: RAMNEET MEASEL MRN: [id number] Date of Birth: 1956/10/09

## 2020-02-06 ENCOUNTER — Encounter (HOSPITAL_COMMUNITY): Payer: Self-pay

## 2020-02-06 ENCOUNTER — Ambulatory Visit (HOSPITAL_COMMUNITY): Payer: BC Managed Care – PPO

## 2020-02-06 DIAGNOSIS — M25661 Stiffness of right knee, not elsewhere classified: Secondary | ICD-10-CM

## 2020-02-06 DIAGNOSIS — R262 Difficulty in walking, not elsewhere classified: Secondary | ICD-10-CM

## 2020-02-06 DIAGNOSIS — M25561 Pain in right knee: Secondary | ICD-10-CM

## 2020-02-06 DIAGNOSIS — G8929 Other chronic pain: Secondary | ICD-10-CM

## 2020-02-06 NOTE — Therapy (Signed)
[place], [place], [address] Phone: [phone]   Fax:  [phone]  Physical Therapy Treatment  Patient Details  Name: Cynthia Ferguson MRN: [id number] Date of Birth: February 24, 1956 Referring Provider (PT): Gerrit Halls   Encounter Date: 02/06/2020  PT End of Session - 02/06/20 0829    Visit Number  8    Number of Visits  12    Date for PT Re-Evaluation  02/20/20    Authorization Type  BCBS    Authorization - Visit Number  8    Authorization - Number of Visits  90    Progress Note Due on Visit  10    PT Start Time  0828    PT Stop Time  0918    PT Time Calculation (min)  50 min    Activity Tolerance  Patient tolerated treatment well    Behavior During Therapy  [facility] for tasks assessed/performed       Past Medical History:  Diagnosis Date  . Allergy   . Anemia   . Anxiety   . Arthritis   . Bladder incontinence   . Cancer ([place]) 2014   left breast   . Complication of anesthesia    "had trouble waking up"  . GERD (gastroesophageal reflux disease)   . Headache(784.0)    migraines  . High cholesterol   . Hypertension   . Personal history of radiation therapy 2014    Past Surgical History:  Procedure Laterality Date  . ABDOMINAL HYSTERECTOMY     COMPLETE   . ANTERIOR CERVICAL DECOMP/DISCECTOMY FUSION N/A 11/15/2018   Procedure: Anterior Cervical Decompression/Discectomy Fusion Cervical Three-Cervical Four, Cervical Four-Cervical Five, Cervical Five-Cervical Six;  Surgeon: Earnie Larsson, MD;  Location: [place];  Service: Neurosurgery;  Laterality: N/A;  Anterior Cervical Decompression/Discectomy Fusion Cervical Three-Cervical Four, Cervical Four-Cervical Five, Cervical Five-Cervical Six  . BACK SURGERY  2019  . BREAST EXCISIONAL BIOPSY Left    age 2...benign  . BREAST LUMPECTOMY Left 2014  . BREAST LUMPECTOMY WITH NEEDLE LOCALIZATION Left 07/11/2013   Procedure: BREAST LUMPECTOMY WITH NEEDLE LOCALIZATION;   Surgeon: Rolm Bookbinder, MD;  Location: [place];  Service: General;  Laterality: Left;  needle localization at [facility] 11:00   . BREAST SURGERY     LEFT BREAST LUMPECTOMY - BENIGN  . BUNIONECTOMY  1993   LEFT FOOT  . BUNIONECTOMY Right NOVEMBER 2013   HAMMER TOE  . NECK SURGERY  2002   DISC REPLACED IN NECK  . right knee athroscopy     2006  . TUBAL LIGATION      There were no vitals filed for this visit.  Subjective Assessment - 02/06/20 0824    Subjective  Pt stated she is feeling good, has been cramping at night.  Pt reports she feels improvements with therapy, no longer has pain at night.    Pertinent History  HTN, cervical surgery, back pain, Lt  knee arthroscopic surgery    Patient Stated Goals  less pain, be able to walk further, feel stable going up and down steps, no pain sit to stand    Currently in Pain?  No/denies                       [facility] Adult PT Treatment/Exercise - 02/06/20 0001      Exercises   Exercises  Knee/Hip      Knee/Hip Exercises: Aerobic   Nustep  level 2 hills  3 x 8"      Knee/Hip Exercises: Machines for Strengthening   Cybex Leg Press  4 PL x 15       Knee/Hip Exercises: Standing   Heel Raises  15 reps    Heel Raises Limitations  toe raises on slant    Forward Lunges  10 reps;Both    Forward Lunges Limitations  crepitus noted    Terminal Knee Extension  Both;15 reps    Theraband Level (Terminal Knee Extension)  Level 2 (Red)    Lateral Step Up  Both;15 reps;Hand Hold: 1;Step Height: 6"    Forward Step Up  Both;15 reps;Step Height: 6"    Functional Squat  10 reps    Functional Squat Limitations  mini due to severe crepitus     Rocker Board  2 minutes    SLS with Vectors  10 seconds x 3     Other Standing Knee Exercises  Sidestep with RTB around thigh 2RT; 2nd lap mini squat               PT Short Term Goals - 02/03/20 0929      PT SHORT TERM GOAL #1   Title  Pt pain in her right knee  to be no  greater than a 6/10 to allow pt to be able to complete an hour of light housework without difficulty    Time  2    Period  Weeks    Status  On-going    Target Date  02/04/20      PT SHORT TERM GOAL #2   Title  Pt Rt knee ROM to be at least 110 degrees to allow pt to be able to don an doff her shoes and socks with ease    Time  2    Period  Weeks    Status  On-going      PT SHORT TERM GOAL #3   Title  PT to be able to single leg stance on her right leg for 45 seconds to decrease risk of falling    Time  2    Period  Weeks    Status  On-going        PT Long Term Goals - 02/03/20 0930      PT LONG TERM GOAL #1   Title  PT to be I in an advanced HEP to allow pt complete 12 steps in a reciprocal manner without increased pain.    Time  6    Period  Weeks    Status  On-going      PT LONG TERM GOAL #2   Title  PT Rt knee ROM to be 120 degrees to allow pt to squat down to pick items off of the floor without difficulty.    Time  6    Period  Weeks    Status  On-going      PT LONG TERM GOAL #3   Title  PT strength in her Rt LE to be increased one grade to be able raise herself up from a squatted position without increased pain.    Time  6    Period  Weeks    Status  On-going      PT LONG TERM GOAL #4   Title  PT to be able to single leg stance for 60" on her Right to decrease risk of falling    Time  6    Period  Weeks    Status  On-going  Plan - 02/06/20 0910    Clinical Impression Statement  Added forward lunges and reduced HHA with standing exercises.  Pt required cueing for posture thorugh session, is self correcting more wihtout cueing required.  Noted crepitus wiht minisquats.  No reports of pain, was limited by [place].    Personal Factors and Comorbidities  Past/Current Experience;Time since onset of injury/illness/exacerbation    Examination-Activity Limitations  Bend;Sit;Squat;Stairs;Locomotion Level;Transfers    Examination-Participation  Restrictions  Cleaning;Community Activity;Yard Work    Stability/Clinical Decision Making  Stable/Uncomplicated    Clinical Decision Making  Low    Rehab Potential  Good    PT Frequency  3x / week    PT Duration  4 weeks    PT Treatment/Interventions  Aquatic Therapy;Iontophoresis 4mg /ml Dexamethasone;Cryotherapy;Gait training;Stair training;Therapeutic activities;Therapeutic exercise;Balance training;Patient/family education    PT Next Visit Plan  Begin step downs    PT Home Exercise Plan  eval: quad set; heelslide.; 01/23/20: SAQ, STS, heel/toe raises       Patient will benefit from skilled therapeutic intervention in order to improve the following deficits and impairments:  Decreased activity tolerance, Decreased balance, Decreased range of motion, Decreased strength, Pain, Difficulty walking  Visit Diagnosis: Chronic pain of right knee  Stiffness of right knee, not elsewhere classified  Difficulty in walking, not elsewhere classified     Problem List Patient Active Problem List   Diagnosis Date Noted  . Cervical myelopathy ([place]) 11/15/2018  . Degenerative spondylolisthesis 01/28/2018  . Vulvar lesion 11/09/2016  . Vulvar atrophy 11/09/2016  . Cystocele 06/08/2014  . Cancer of central portion of left female breast ([place]) 07/21/2013  . Rectocele 06/05/2013  . Menopause 06/05/2013  . Vaginal atrophy 06/05/2013  . HTN (hypertension) 06/05/2013  . Other and unspecified hyperlipidemia 06/05/2013  . Stress incontinence in female 05/07/2012  . Female cystocele 05/07/2012   Ihor Austin, LPTA/CLT; CBIS [phone]  Aldona Lento 02/06/2020, 9:18 AM  [place], [place], [address] Phone: [phone]   Fax:  [phone]  Name: FEMI DALAL MRN: [id number] Date of Birth: 1956-10-11

## 2020-02-10 ENCOUNTER — Ambulatory Visit (HOSPITAL_COMMUNITY): Payer: BC Managed Care – PPO | Admitting: Physical Therapy

## 2020-02-10 ENCOUNTER — Other Ambulatory Visit: Payer: Self-pay

## 2020-02-10 ENCOUNTER — Encounter (HOSPITAL_COMMUNITY): Payer: Self-pay | Admitting: Physical Therapy

## 2020-02-10 DIAGNOSIS — M25661 Stiffness of right knee, not elsewhere classified: Secondary | ICD-10-CM

## 2020-02-10 DIAGNOSIS — R262 Difficulty in walking, not elsewhere classified: Secondary | ICD-10-CM | POA: Diagnosis not present

## 2020-02-10 DIAGNOSIS — M25561 Pain in right knee: Secondary | ICD-10-CM

## 2020-02-10 DIAGNOSIS — G8929 Other chronic pain: Secondary | ICD-10-CM

## 2020-02-10 NOTE — Therapy (Signed)
[facility] [place], [place], [address] Phone: [phone]   Fax:  [phone]  Physical Therapy Treatment  Patient Details  Name: Cynthia Ferguson MRN: [id number] Date of Birth: [birth year] Referring Provider (PT): Gerrit Halls   Encounter Date: 02/10/2020  PT End of Session - 02/10/20 0901    Visit Number  9    Number of Visits  12    Date for PT Re-Evaluation  02/20/20    Authorization Type  BCBS    Authorization - Visit Number  9    Authorization - Number of Visits  90    Progress Note Due on Visit  10    PT Start Time  0825    PT Stop Time  0914    PT Time Calculation (min)  49 min    Activity Tolerance  Patient tolerated treatment well    Behavior During Therapy  [facility] for tasks assessed/performed       Past Medical History:  Diagnosis Date  . Allergy   . Anemia   . Anxiety   . Arthritis   . Bladder incontinence   . Cancer ([place]) 2014   left breast   . Complication of anesthesia    "had trouble waking up"  . GERD (gastroesophageal reflux disease)   . Headache(784.0)    migraines  . High cholesterol   . Hypertension   . Personal history of radiation therapy 2014    Past Surgical History:  Procedure Laterality Date  . ABDOMINAL HYSTERECTOMY     COMPLETE   . ANTERIOR CERVICAL DECOMP/DISCECTOMY FUSION N/A 11/15/2018   Procedure: Anterior Cervical Decompression/Discectomy Fusion Cervical Three-Cervical Four, Cervical Four-Cervical Five, Cervical Five-Cervical Six;  Surgeon: Earnie Larsson, MD;  Location: [facility];  Service: Neurosurgery;  Laterality: N/A;  Anterior Cervical Decompression/Discectomy Fusion Cervical Three-Cervical Four, Cervical Four-Cervical Five, Cervical Five-Cervical Six  . BACK SURGERY  2019  . BREAST EXCISIONAL BIOPSY Left    age 6...benign  . BREAST LUMPECTOMY Left 2014  . BREAST LUMPECTOMY WITH NEEDLE LOCALIZATION Left 07/11/2013   Procedure: BREAST LUMPECTOMY WITH NEEDLE LOCALIZATION;   Surgeon: Rolm Bookbinder, MD;  Location: [facility];  Service: General;  Laterality: Left;  needle localization at [facility] 11:00   . BREAST SURGERY     LEFT BREAST LUMPECTOMY - BENIGN  . BUNIONECTOMY  1993   LEFT FOOT  . BUNIONECTOMY Right NOVEMBER 2013   HAMMER TOE  . NECK SURGERY  2002   DISC REPLACED IN NECK  . right knee athroscopy     2006  . TUBAL LIGATION      There were no vitals filed for this visit.  Subjective Assessment - 02/10/20 0822    Subjective  PT is doing alright today.   Feels anxious, when her knee catches her pain is a 5/10 other than that it feels pretty well .    Pertinent History  HTN, cervical surgery, back pain, Lt  knee arthroscopic surgery    Limitations  Sitting;Standing;Walking    How long can you sit comfortably?  can sit for two hours    How long can you stand comfortably?  no problem    How long can you walk comfortably?  able to walk for about 15 minutes at one time.    Patient Stated Goals  less pain, be able to walk further, feel stable going up and down steps, no pain sit to stand    Currently in Pain?  No/denies  Pain Onset  More than a month ago              [facility] PT Treatment/Exercise - 02/10/20 0001      Exercises   Exercises  Knee/Hip      Knee/Hip Exercises: Stretches   Knee: Self-Stretch to increase Flexion  Both;5 reps;10 seconds      Knee/Hip Exercises: Aerobic   Nustep  level 2 hills 3 x 8"      Knee/Hip Exercises: Machines for Strengthening   Cybex Leg Press  4 PL x 15       Knee/Hip Exercises: Standing   Heel Raises  15 reps    Heel Raises Limitations  toe raises on slant    Forward Lunges  Both;10 reps;Limitations    Forward Lunges Limitations  On 4" step; crepitus noted    Terminal Knee Extension  Both;15 reps    Theraband Level (Terminal Knee Extension)  --    Lateral Step Up  Both;15 reps;Hand Hold: 1;Step Height: 6"    Forward Step Up  Both;15 reps;Step Height: 6"    Step Down   Right;5 reps;Step Height: 4"    Functional Squat  10 reps    Functional Squat Limitations  mini due to severe crepitus     Rocker Board  2 minutes    SLS with Vectors  10 seconds x 3     Other Standing Knee Exercises  Sidestep with RTB around thigh 2RT; 2nd lap mini squat               PT Short Term Goals - 02/03/20 0929      PT SHORT TERM GOAL #1   Title  Pt pain in her right knee  to be no greater than a 6/10 to allow pt to be able to complete an hour of light housework without difficulty    Time  2    Period  Weeks    Status  On-going    Target Date  02/04/20      PT SHORT TERM GOAL #2   Title  Pt Rt knee ROM to be at least 110 degrees to allow pt to be able to don an doff her shoes and socks with ease    Time  2    Period  Weeks    Status  On-going      PT SHORT TERM GOAL #3   Title  PT to be able to single leg stance on her right leg for 45 seconds to decrease risk of falling    Time  2    Period  Weeks    Status  On-going        PT Long Term Goals - 02/03/20 0930      PT LONG TERM GOAL #1   Title  PT to be I in an advanced HEP to allow pt complete 12 steps in a reciprocal manner without increased pain.    Time  6    Period  Weeks    Status  On-going      PT LONG TERM GOAL #2   Title  PT Rt knee ROM to be 120 degrees to allow pt to squat down to pick items off of the floor without difficulty.    Time  6    Period  Weeks    Status  On-going      PT LONG TERM GOAL #3   Title  PT strength in her Rt LE to be increased  one grade to be able raise herself up from a squatted position without increased pain.    Time  6    Period  Weeks    Status  On-going      PT LONG TERM GOAL #4   Title  PT to be able to single leg stance for 60" on her Right to decrease risk of falling    Time  6    Period  Weeks    Status  On-going            Plan - 02/10/20 0906    Clinical Impression Statement  No hand hold for forward lunge this session completed on 4"  step due to crepitus .  Added step down on 4" step with B UE support.    Personal Factors and Comorbidities  Past/Current Experience;Time since onset of injury/illness/exacerbation    Examination-Activity Limitations  Bend;Sit;Squat;Stairs;Locomotion Level;Transfers    Examination-Participation Restrictions  Cleaning;Community Activity;Yard Work    Stability/Clinical Decision Making  Stable/Uncomplicated    Rehab Potential  Good    PT Frequency  3x / week    PT Duration  4 weeks    PT Treatment/Interventions  Aquatic Therapy;Iontophoresis 4mg /ml Dexamethasone;Cryotherapy;Gait training;Stair training;Therapeutic activities;Therapeutic exercise;Balance training;Patient/family education    PT Next Visit Plan  Add Single leg stance back into program.    PT Home Exercise Plan  eval: quad set; heelslide.; 01/23/20: SAQ, STS, heel/toe raises       Patient will benefit from skilled therapeutic intervention in order to improve the following deficits and impairments:  Decreased activity tolerance, Decreased balance, Decreased range of motion, Decreased strength, Pain, Difficulty walking  Visit Diagnosis: Chronic pain of right knee  Stiffness of right knee, not elsewhere classified  Difficulty in walking, not elsewhere classified     Problem List Patient Active Problem List   Diagnosis Date Noted  . Cervical myelopathy ([place]) 11/15/2018  . Degenerative spondylolisthesis 01/28/2018  . Vulvar lesion 11/09/2016  . Vulvar atrophy 11/09/2016  . Cystocele 06/08/2014  . Cancer of central portion of left female breast ([place]) 07/21/2013  . Rectocele 06/05/2013  . Menopause 06/05/2013  . Vaginal atrophy 06/05/2013  . HTN (hypertension) 06/05/2013  . Other and unspecified hyperlipidemia 06/05/2013  . Stress incontinence in female 05/07/2012  . Female cystocele 05/07/2012    Rayetta Humphrey, PT CLT [phone] 02/10/2020, 9:20 AM  [facility] [address] [place], [place], [address] Phone: [phone]   Fax:  [phone]  Name: Cynthia Ferguson MRN: [id number] Date of Birth: Mar 12, 1956

## 2020-02-11 ENCOUNTER — Other Ambulatory Visit: Payer: Self-pay

## 2020-02-11 ENCOUNTER — Ambulatory Visit (HOSPITAL_COMMUNITY): Payer: BC Managed Care – PPO | Admitting: Physical Therapy

## 2020-02-11 DIAGNOSIS — R262 Difficulty in walking, not elsewhere classified: Secondary | ICD-10-CM

## 2020-02-11 DIAGNOSIS — G8929 Other chronic pain: Secondary | ICD-10-CM

## 2020-02-11 DIAGNOSIS — M25661 Stiffness of right knee, not elsewhere classified: Secondary | ICD-10-CM

## 2020-02-11 NOTE — Therapy (Signed)
[facility] [address] [place], [place], [address] Phone: [phone]   Fax:  [phone]  Physical Therapy Treatment  Patient Details  Name: Cynthia Ferguson MRN: [id number] Date of Birth: 1956/07/13 Referring Provider (PT): Gerrit Halls  Progress Note Reporting Period  01/21/2020  to 02/11/2020  See note below for Objective Data and Assessment of Progress/Goals.       Encounter Date: 02/11/2020  PT End of Session - 02/11/20 0924    Visit Number  10    Number of Visits  12    Date for PT Re-Evaluation  02/20/20    Authorization Type  BCBS    Authorization - Visit Number  10    Authorization - Number of Visits  90    Progress Note Due on Visit  12    PT Start Time  0922    PT Stop Time  1000    PT Time Calculation (min)  38 min    Activity Tolerance  Patient tolerated treatment well    Behavior During Therapy  W[facility] for tasks assessed/performed       Past Medical History:  Diagnosis Date  . Allergy   . Anemia   . Anxiety   . Arthritis   . Bladder incontinence   . Cancer ([place]) 2014   left breast   . Complication of anesthesia    "had trouble waking up"  . GERD (gastroesophageal reflux disease)   . Headache(784.0)    migraines  . High cholesterol   . Hypertension   . Personal history of radiation therapy 2014    Past Surgical History:  Procedure Laterality Date  . ABDOMINAL HYSTERECTOMY     COMPLETE   . ANTERIOR CERVICAL DECOMP/DISCECTOMY FUSION N/A 11/15/2018   Procedure: Anterior Cervical Decompression/Discectomy Fusion Cervical Three-Cervical Four, Cervical Four-Cervical Five, Cervical Five-Cervical Six;  Surgeon: Earnie Larsson, MD;  Location: [facility];  Service: Neurosurgery;  Laterality: N/A;  Anterior Cervical Decompression/Discectomy Fusion Cervical Three-Cervical Four, Cervical Four-Cervical Five, Cervical Five-Cervical Six  . BACK SURGERY  2019  . BREAST EXCISIONAL BIOPSY Left    age 21...benign  . BREAST  LUMPECTOMY Left 2014  . BREAST LUMPECTOMY WITH NEEDLE LOCALIZATION Left 07/11/2013   Procedure: BREAST LUMPECTOMY WITH NEEDLE LOCALIZATION;  Surgeon: Rolm Bookbinder, MD;  Location: [facility];  Service: General;  Laterality: Left;  needle localization at [facility] 11:00   . BREAST SURGERY     LEFT BREAST LUMPECTOMY - BENIGN  . BUNIONECTOMY  1993   LEFT FOOT  . BUNIONECTOMY Right NOVEMBER 2013   HAMMER TOE  . NECK SURGERY  2002   DISC REPLACED IN NECK  . right knee athroscopy     2006  . TUBAL LIGATION      There were no vitals filed for this visit.  Subjective Assessment - 02/11/20 0940    Subjective  Pt states that her rt knee bothers her when she is having to do things like steps but the pain that she would have when she goes to stand up is better.  As a whole she feels about 50% better.    Pertinent History  HTN, cervical surgery, back pain, Lt  knee arthroscopic surgery    Limitations  Sitting;Standing;Walking    How long can you sit comfortably?  can sit for two hours    How long can you stand comfortably?  no problem    How long can you walk comfortably?  able to walk for about  15 minutes at one time.    Patient Stated Goals  less pain, be able to walk further, feel stable going up and down steps, no pain sit to stand    Pain Onset  More than a month ago         [facility] PT Assessment - 02/11/20 0001      Assessment   Medical Diagnosis  Rt knee pain     Referring Provider (PT)  Gerrit Halls    Onset Date/Surgical Date  12/08/19   acute exacerbation   Next MD Visit  02/27/2020    Prior Therapy  not for this       Precautions   Precautions  None      Restrictions   Weight Bearing Restrictions  Yes      Jud residence    Home Access  Stairs to enter    Entrance Stairs-Number of Steps  3      Prior Function   Level of Independence  Independent    Vocation  Full time employment    Vocation Requirements   standing, walking , lifting     Leisure  none       Cognition   Overall Cognitive Status  Within Functional Limits for tasks assessed      Observation/Other Assessments   Focus on Therapeutic Outcomes (FOTO)   65 with 35 % limitation; was 49 with 51% improvement.      Functional Tests   Functional tests  Single leg stance;Sit to Stand      Single Leg Stance   Comments  Lt: 60:   Rt: 40 was  20      Sit to Stand   Comments  10 in 30 seconds was 8   feels shot helped and she would not be able to do it yesterd     AROM   Right Knee Extension  2   was 4 ; Lt is 2 as well    Right Knee Flexion  118   was 105      Strength   Right Knee Extension  5/5   was 4/5    Left Knee Extension  5/5                   [facility] Adult PT Treatment/Exercise - 02/11/20 0001      Exercises   Exercises  Knee/Hip      Knee/Hip Exercises: Aerobic   Nustep  level 2 hills 3 x 8"      Knee/Hip Exercises: Machines for Strengthening   Cybex Leg Press  5 PL x 15       Knee/Hip Exercises: Standing   Heel Raises  15 reps    Terminal Knee Extension  Both;15 reps    Lateral Step Up  Both;15 reps;Hand Hold: 1;Step Height: 6"    Forward Step Up  Both;15 reps;Step Height: 6"    SLS  x3               PT Short Term Goals - 02/11/20 1001      PT SHORT TERM GOAL #1   Title  Pt pain in her right knee  to be no greater than a 6/10 to allow pt to be able to complete an hour of light housework without difficulty    Time  2    Period  Weeks    Status  Achieved    Target Date  02/04/20  PT SHORT TERM GOAL #2   Title  Pt Rt knee ROM to be at least 110 degrees to allow pt to be able to don an doff her shoes and socks with ease    Time  2    Period  Weeks    Status  Achieved      PT SHORT TERM GOAL #3   Title  PT to be able to single leg stance on her right leg for 45 seconds to decrease risk of falling    Time  2    Period  Weeks    Status  On-going        PT Long Term  Goals - 02/11/20 1001      PT LONG TERM GOAL #1   Title  PT to be I in an advanced HEP to allow pt complete 12 steps in a reciprocal manner without increased pain.    Time  6    Period  Weeks    Status  On-going      PT LONG TERM GOAL #2   Title  PT Rt knee ROM to be 120 degrees to allow pt to squat down to pick items off of the floor without difficulty.    Time  6    Period  Weeks    Status  On-going      PT LONG TERM GOAL #3   Title  PT strength in her Rt LE to be increased one grade to be able raise herself up from a squatted position without increased pain.    Time  6    Period  Weeks    Status  Achieved      PT LONG TERM GOAL #4   Title  PT to be able to single leg stance for 60" on her Right to decrease risk of falling    Time  6    Period  Weeks    Status  On-going            Plan - 02/11/20 0959    Clinical Impression Statement  Pt reassessed with significant improvement.  PT still limited due to OA and balance.  PT will be ready for discharge next week.     Personal Factors and Comorbidities  Past/Current Experience;Time since onset of injury/illness/exacerbation    Examination-Activity Limitations  Bend;Sit;Squat;Stairs;Locomotion Level;Transfers    Examination-Participation Restrictions  Cleaning;Community Activity;Yard Work    Stability/Clinical Decision Making  Stable/Uncomplicated    Rehab Potential  Good    PT Frequency  3x / week    PT Duration  4 weeks    PT Treatment/Interventions  Aquatic Therapy;Iontophoresis 4mg /ml Dexamethasone;Cryotherapy;Gait training;Stair training;Therapeutic activities;Therapeutic exercise;Balance training;Patient/family education    PT Next Visit Plan  Continute to imporve balance and functional exercises.    PT Home Exercise Plan  eval: quad set; heelslide.; 01/23/20: SAQ, STS, heel/toe raises       Patient will benefit from skilled therapeutic intervention in order to improve the following deficits and impairments:   Decreased activity tolerance, Decreased balance, Decreased range of motion, Decreased strength, Pain, Difficulty walking  Visit Diagnosis: Chronic pain of right knee  Stiffness of right knee, not elsewhere classified  Difficulty in walking, not elsewhere classified     Problem List Patient Active Problem List   Diagnosis Date Noted  . Cervical myelopathy ([place]) 11/15/2018  . Degenerative spondylolisthesis 01/28/2018  . Vulvar lesion 11/09/2016  . Vulvar atrophy 11/09/2016  . Cystocele 06/08/2014  . Cancer of central portion of left  female breast ([place]) 07/21/2013  . Rectocele 06/05/2013  . Menopause 06/05/2013  . Vaginal atrophy 06/05/2013  . HTN (hypertension) 06/05/2013  . Other and unspecified hyperlipidemia 06/05/2013  . Stress incontinence in female 05/07/2012  . Female cystocele 05/07/2012    Rayetta Humphrey, PT CLT [phone] 02/11/2020, 10:02 AM  [facility] [address] [place], [place], [address] Phone: [phone]   Fax:  [phone]  Name: Cynthia Ferguson MRN: [id number] Date of Birth: [birth year]

## 2020-02-13 ENCOUNTER — Other Ambulatory Visit: Payer: Self-pay

## 2020-02-13 ENCOUNTER — Encounter (HOSPITAL_COMMUNITY): Payer: Self-pay

## 2020-02-13 ENCOUNTER — Ambulatory Visit (HOSPITAL_COMMUNITY): Payer: BC Managed Care – PPO

## 2020-02-13 DIAGNOSIS — G8929 Other chronic pain: Secondary | ICD-10-CM

## 2020-02-13 DIAGNOSIS — R262 Difficulty in walking, not elsewhere classified: Secondary | ICD-10-CM | POA: Diagnosis not present

## 2020-02-13 DIAGNOSIS — M25661 Stiffness of right knee, not elsewhere classified: Secondary | ICD-10-CM

## 2020-02-13 NOTE — Therapy (Signed)
[place], [place], [address] Phone: [phone]   Fax:  [phone]  Physical Therapy Treatment  Patient Details  Name: Cynthia Ferguson MRN: [id number] Date of Birth: [birth year] Referring Provider (PT): Gerrit Halls   Encounter Date: 02/13/2020  PT End of Session - 02/13/20 0832    Visit Number  11    Number of Visits  12    Date for PT Re-Evaluation  02/20/20    Authorization Type  BCBS    Authorization - Visit Number  11    Authorization - Number of Visits  90    Progress Note Due on Visit  12    PT Start Time  0828    PT Stop Time  0920   8' on nustep at EOS, not included with charges   PT Time Calculation (min)  52 min    Activity Tolerance  Patient tolerated treatment well    Behavior During Therapy  [facility] for tasks assessed/performed       Past Medical History:  Diagnosis Date  . Allergy   . Anemia   . Anxiety   . Arthritis   . Bladder incontinence   . Cancer ([place]) 2014   left breast   . Complication of anesthesia    "had trouble waking up"  . GERD (gastroesophageal reflux disease)   . Headache(784.0)    migraines  . High cholesterol   . Hypertension   . Personal history of radiation therapy 2014    Past Surgical History:  Procedure Laterality Date  . ABDOMINAL HYSTERECTOMY     COMPLETE   . ANTERIOR CERVICAL DECOMP/DISCECTOMY FUSION N/A 11/15/2018   Procedure: Anterior Cervical Decompression/Discectomy Fusion Cervical Three-Cervical Four, Cervical Four-Cervical Five, Cervical Five-Cervical Six;  Surgeon: Earnie Larsson, MD;  Location: [facility];  Service: Neurosurgery;  Laterality: N/A;  Anterior Cervical Decompression/Discectomy Fusion Cervical Three-Cervical Four, Cervical Four-Cervical Five, Cervical Five-Cervical Six  . BACK SURGERY  2019  . BREAST EXCISIONAL BIOPSY Left    age 87...benign  . BREAST LUMPECTOMY Left 2014  . BREAST LUMPECTOMY WITH NEEDLE LOCALIZATION Left 07/11/2013   Procedure:  BREAST LUMPECTOMY WITH NEEDLE LOCALIZATION;  Surgeon: Rolm Bookbinder, MD;  Location: [facility];  Service: General;  Laterality: Left;  needle localization at [facility] 11:00   . BREAST SURGERY     LEFT BREAST LUMPECTOMY - BENIGN  . BUNIONECTOMY  1993   LEFT FOOT  . BUNIONECTOMY Right NOVEMBER 2013   HAMMER TOE  . NECK SURGERY  2002   DISC REPLACED IN NECK  . right knee athroscopy     2006  . TUBAL LIGATION      There were no vitals filed for this visit.  Subjective Assessment - 02/13/20 0831    Subjective  Pt reports increased Rt knee pain the last 2 days and continues to have difficulty with stairs.  No reports of pain currently.    Pertinent History  HTN, cervical surgery, back pain, Lt  knee arthroscopic surgery    Patient Stated Goals  less pain, be able to walk further, feel stable going up and down steps, no pain sit to stand    Currently in Pain?  No/denies         [facility] PT Assessment - 02/13/20 0001      Assessment   Medical Diagnosis  Rt knee pain     Referring Provider (PT)  Gerrit Halls    Onset Date/Surgical Date  12/08/19  acute exacerbation   Next MD Visit  02/27/2020    Prior Therapy  not for this       Precautions   Precautions  None                   [facility] Adult PT Treatment/Exercise - 02/13/20 0001      Exercises   Exercises  Knee/Hip      Knee/Hip Exercises: Aerobic   Nustep  level 2 hills 3 x 8"      Knee/Hip Exercises: Machines for Strengthening   Cybex Leg Press  5 PL 2 x 12      Knee/Hip Exercises: Standing   Terminal Knee Extension  Both;15 reps    Theraband Level (Terminal Knee Extension)  Level 2 (Red)    Lateral Step Up  Both;15 reps;Hand Hold: 1;Step Height: 6"    Forward Step Up  Both;15 reps;Step Height: 6"    Functional Squat  10 reps    Functional Squat Limitations  mini due to severe crepitus     Stairs  4in height 5RT 1 HR    SLS with Vectors  on foam10 seconds x 3     Other Standing Knee Exercises   Sidestep with RTB around thigh 2RT; 2nd lap mini squat    Other Standing Knee Exercises  Postural strengthening wiht RTB 2x 10 shoulder extension and rows               PT Short Term Goals - 02/11/20 1001      PT SHORT TERM GOAL #1   Title  Pt pain in her right knee  to be no greater than a 6/10 to allow pt to be able to complete an hour of light housework without difficulty    Time  2    Period  Weeks    Status  Achieved    Target Date  02/04/20      PT SHORT TERM GOAL #2   Title  Pt Rt knee ROM to be at least 110 degrees to allow pt to be able to don an doff her shoes and socks with ease    Time  2    Period  Weeks    Status  Achieved      PT SHORT TERM GOAL #3   Title  PT to be able to single leg stance on her right leg for 45 seconds to decrease risk of falling    Time  2    Period  Weeks    Status  On-going        PT Long Term Goals - 02/11/20 1001      PT LONG TERM GOAL #1   Title  PT to be I in an advanced HEP to allow pt complete 12 steps in a reciprocal manner without increased pain.    Time  6    Period  Weeks    Status  On-going      PT LONG TERM GOAL #2   Title  PT Rt knee ROM to be 120 degrees to allow pt to squat down to pick items off of the floor without difficulty.    Time  6    Period  Weeks    Status  On-going      PT LONG TERM GOAL #3   Title  PT strength in her Rt LE to be increased one grade to be able raise herself up from a squatted position without increased pain.  Time  6    Period  Weeks    Status  Achieved      PT LONG TERM GOAL #4   Title  PT to be able to single leg stance for 60" on her Right to decrease risk of falling    Time  6    Period  Weeks    Status  On-going            Plan - 02/13/20 1232    Clinical Impression Statement  Pt educated on importance of proper posture to reduce anterior knee pain.  Added theraband postural strengthening to POC, if able to demonstrate appropriate mechanics may add to HEP.   Continued session focus with functional strengthening and balance training.  Pt able to demonstrate good mechancis with reciprocal pattern on 4 in step height.  Able to add foam with vector  stance for balance and hip strengthening.  Pt was limited by fatigue at EOS, no reoprts of pain.    Personal Factors and Comorbidities  Past/Current Experience;Time since onset of injury/illness/exacerbation    Examination-Activity Limitations  Bend;Sit;Squat;Stairs;Locomotion Level;Transfers    Examination-Participation Restrictions  Cleaning;Community Activity;Yard Work    Stability/Clinical Decision Making  Stable/Uncomplicated    Clinical Decision Making  Low    Rehab Potential  Good    PT Frequency  3x / week    PT Duration  4 weeks    PT Treatment/Interventions  Aquatic Therapy;Iontophoresis 4mg /ml Dexamethasone;Cryotherapy;Gait training;Stair training;Therapeutic activities;Therapeutic exercise;Balance training;Patient/family education    PT Next Visit Plan  Continute with balance and functional exercises.    PT Home Exercise Plan  eval: quad set; heelslide.; 01/23/20: SAQ, STS, heel/toe raises       Patient will benefit from skilled therapeutic intervention in order to improve the following deficits and impairments:  Decreased activity tolerance, Decreased balance, Decreased range of motion, Decreased strength, Pain, Difficulty walking  Visit Diagnosis: Difficulty in walking, not elsewhere classified  Chronic pain of right knee  Stiffness of right knee, not elsewhere classified     Problem List Patient Active Problem List   Diagnosis Date Noted  . Cervical myelopathy ([place]) 11/15/2018  . Degenerative spondylolisthesis 01/28/2018  . Vulvar lesion 11/09/2016  . Vulvar atrophy 11/09/2016  . Cystocele 06/08/2014  . Cancer of central portion of left female breast ([place]) 07/21/2013  . Rectocele 06/05/2013  . Menopause 06/05/2013  . Vaginal atrophy 06/05/2013  . HTN (hypertension) 06/05/2013   . Other and unspecified hyperlipidemia 06/05/2013  . Stress incontinence in female 05/07/2012  . Female cystocele 05/07/2012   Ihor Austin, LPTA/CLT; CBIS [phone]  Aldona Lento 02/13/2020, 12:35 PM  [place], [place], [address] Phone: [phone]   Fax:  [phone]  Name: Cynthia Ferguson MRN: [id number] Date of Birth: 12-24-1955

## 2020-02-16 ENCOUNTER — Telehealth (HOSPITAL_COMMUNITY): Payer: Self-pay | Admitting: Physical Therapy

## 2020-02-16 NOTE — Telephone Encounter (Signed)
Pt''s brother will have open heart surgery tomorrow and she will not be here. She will be back on Wed and Friday

## 2020-02-17 ENCOUNTER — Ambulatory Visit (HOSPITAL_COMMUNITY): Payer: BC Managed Care – PPO | Admitting: Physical Therapy

## 2020-02-18 ENCOUNTER — Encounter (HOSPITAL_COMMUNITY): Payer: Self-pay

## 2020-02-18 ENCOUNTER — Ambulatory Visit (HOSPITAL_COMMUNITY): Payer: BC Managed Care – PPO

## 2020-02-18 ENCOUNTER — Other Ambulatory Visit: Payer: Self-pay

## 2020-02-18 DIAGNOSIS — G8929 Other chronic pain: Secondary | ICD-10-CM

## 2020-02-18 DIAGNOSIS — R262 Difficulty in walking, not elsewhere classified: Secondary | ICD-10-CM

## 2020-02-18 DIAGNOSIS — M25661 Stiffness of right knee, not elsewhere classified: Secondary | ICD-10-CM

## 2020-02-18 NOTE — Patient Instructions (Addendum)
FUNCTIONAL MOBILITY: Squat    Stance: shoulder-width on floor. Bend hips and knees. Keep back straight. Do not allow knees to bend past toes. Squeeze glutes and quads to stand. 10 reps per set, 1 sets per day, 4 days per week  Copyright  VHI. All rights reserved.   Single Leg Balance: Eyes Open    Stand on right leg with eyes open. Hold 60 seconds. 3 reps 2 times per day.  http://ggbe.exer.us/5   Copyright  VHI. All rights reserved.   Band Walk: Side Stepping    Tie band around legs, just above knees. Step 15 feet to one side, then step back to start. Note: Small towel between band and skin eases rubbing.  http://plyo.exer.us/76   Copyright  VHI. All rights reserved.   Tandem Stance    Right foot in front of left, heel touching toe both feet "straight ahead". Stand on Foot Triangle of Support with both feet. Balance in this position 30 seconds. Do with left foot in front of right.  Copyright  VHI. All rights reserved.

## 2020-02-18 NOTE — Therapy (Addendum)
[facility] [address] [place], [place], [address] Phone: [phone]   Fax:  [phone]  Physical Therapy Treatment  Patient Details  Name: Cynthia Ferguson MRN: [id number] Date of Birth: [birth year] Referring Provider (PT): Gerrit Halls  PHYSICAL THERAPY DISCHARGE SUMMARY  Visits from Start of Care: 12  Current functional level related to goals / functional outcomes: See below   Remaining deficits: See below   Education / Equipment: HEP Plan: Patient agrees to discharge.  Patient goals were met. Patient is being discharged due to being pleased with the current functional level.  ?????     Encounter Date: 02/18/2020  PT End of Session - 02/18/20 1224    Visit Number  12    Number of Visits  12    Date for PT Re-Evaluation  02/20/20    Authorization Type  BCBS    Authorization - Visit Number  12    Authorization - Number of Visits  90    Progress Note Due on Visit  12    PT Start Time  0833    PT Stop Time  0913    PT Time Calculation (min)  40 min    Activity Tolerance  Patient tolerated treatment well    Behavior During Therapy  [facility] for tasks assessed/performed       Past Medical History:  Diagnosis Date  . Allergy   . Anemia   . Anxiety   . Arthritis   . Bladder incontinence   . Cancer ([place]) 2014   left breast   . Complication of anesthesia    "had trouble waking up"  . GERD (gastroesophageal reflux disease)   . Headache(784.0)    migraines  . High cholesterol   . Hypertension   . Personal history of radiation therapy 2014    Past Surgical History:  Procedure Laterality Date  . ABDOMINAL HYSTERECTOMY     COMPLETE   . ANTERIOR CERVICAL DECOMP/DISCECTOMY FUSION N/A 11/15/2018   Procedure: Anterior Cervical Decompression/Discectomy Fusion Cervical Three-Cervical Four, Cervical Four-Cervical Five, Cervical Five-Cervical Six;  Surgeon: Earnie Larsson, MD;  Location: [facility];  Service: Neurosurgery;  Laterality:  N/A;  Anterior Cervical Decompression/Discectomy Fusion Cervical Three-Cervical Four, Cervical Four-Cervical Five, Cervical Five-Cervical Six  . BACK SURGERY  2019  . BREAST EXCISIONAL BIOPSY Left    age 65...benign  . BREAST LUMPECTOMY Left 2014  . BREAST LUMPECTOMY WITH NEEDLE LOCALIZATION Left 07/11/2013   Procedure: BREAST LUMPECTOMY WITH NEEDLE LOCALIZATION;  Surgeon: Rolm Bookbinder, MD;  Location: [facility];  Service: General;  Laterality: Left;  needle localization at [facility] 11:00   . BREAST SURGERY     LEFT BREAST LUMPECTOMY - BENIGN  . BUNIONECTOMY  1993   LEFT FOOT  . BUNIONECTOMY Right NOVEMBER 2013   HAMMER TOE  . NECK SURGERY  2002   DISC REPLACED IN NECK  . right knee athroscopy     2006  . TUBAL LIGATION      There were no vitals filed for this visit.  Subjective Assessment - 02/18/20 0835    Subjective  Pt arrived with smile on face, stated she was able to stand on both legs SLS for 60" at home.  No reports of pain today    Pertinent History  HTN, cervical surgery, back pain, Lt  knee arthroscopic surgery    Patient Stated Goals  less pain, be able to walk further, feel stable going up and down steps, no pain sit to  stand    Currently in Pain?  No/denies         [facility] PT Assessment - 02/18/20 0001      Assessment   Medical Diagnosis  Rt knee pain     Referring Provider (PT)  Stephen Chabon    Onset Date/Surgical Date  12/08/19   acute exacerbation   Next MD Visit  02/27/2020    Prior Therapy  not for this       Precautions   Precautions  None      Observation/Other Assessments   Focus on Therapeutic Outcomes (FOTO)   65 with 35 % limitation; was 49 with 51% improvement.      Functional Tests   Functional tests  Single Leg Squat      Single Leg Stance   Comments  Lt and Rt 60" each   was Lt 60: Rt 40     Sit to Stand   Comments  5 STS 10.60"   was 10 in 30"     AROM   Right Knee Extension  2   was 2   Right Knee Flexion  118    was 118                  [facility] Adult PT Treatment/Exercise - 02/18/20 0001      Exercises   Exercises  Knee/Hip      Knee/Hip Exercises: Aerobic   Nustep  level 2 hills 3 x 8"      Knee/Hip Exercises: Machines for Strengthening   Cybex Leg Press  5 PL 2 x 12      Knee/Hip Exercises: Standing   Functional Squat  10 reps    Functional Squat Limitations  mini due to severe crepitus     Stairs  7in height 5RT    SLS  60" BLE    SLS with Vectors  3x 10" BLE    Other Standing Knee Exercises  Sidestep with RTB around thigh 2RT               PT Short Term Goals - 02/18/20 1627      PT SHORT TERM GOAL #1   Title  Pt pain in her right knee  to be no greater than a 6/10 to allow pt to be able to complete an hour of light housework without difficulty    Status  Achieved      PT SHORT TERM GOAL #2   Title  Pt Rt knee ROM to be at least 110 degrees to allow pt to be able to don an doff her shoes and socks with ease    Status  Achieved      PT SHORT TERM GOAL #3   Title  PT to be able to single leg stance on her right leg for 45 seconds to decrease risk of falling    Baseline  02/18/20:  Lt 60", Rt 60"    Status  Achieved        PT Long Term Goals - 02/18/20 1403      PT LONG TERM GOAL #1   Title  PT to be I in an advanced HEP to allow pt complete 12 steps in a reciprocal manner without increased pain.    Baseline  02/18/20:  Able to complete reciprocal manner with 1 HR x 12 steps, does report some pain intermittently.    Time  6    Status  Partially Met      PT LONG   TERM GOAL #2   Title  PT Rt knee ROM to be 120 degrees to allow pt to squat down to pick items off of the floor without difficulty.    Baseline  02/18/20:  AROM 2-118 degrees, crepitus and pain with deep squat, able to demonstrate appropriate mechanics wiht squat.    Status  On-going      PT LONG TERM GOAL #3   Title  PT strength in her Rt LE to be increased one grade to be able raise herself  up from a squatted position without increased pain.    Baseline  02/18/20:  MMT 5/5 on 02/11/20    Status  Achieved      PT LONG TERM GOAL #4   Title  PT to be able to single leg stance for 60" on her Right to decrease risk of falling    Baseline  02/18/20:  BLE 60"    Status  Achieved            Plan - 02/18/20 1358    Clinical Impression Statement  Reviewed exercises prior DC to HEP this session.  Pt able to demonstrate appropriate mechanics with all exercises and reviewed safety with additional balance exercises.  Pt AROM 2-118, MMT 5/5 (completed 2 sessions ago).  FOTO score has decreased since last taken though pt stated she feels 90% improvements.    Personal Factors and Comorbidities  Past/Current Experience;Time since onset of injury/illness/exacerbation    Examination-Activity Limitations  Bend;Sit;Squat;Stairs;Locomotion Level;Transfers    Examination-Participation Restrictions  Cleaning;Community Activity;Yard Work    Stability/Clinical Decision Making  Stable/Uncomplicated    Clinical Decision Making  Low    Rehab Potential  Good    PT Frequency  3x / week    PT Duration  4 weeks    PT Treatment/Interventions  Aquatic Therapy;Iontophoresis 4mg/ml Dexamethasone;Cryotherapy;Gait training;Stair training;Therapeutic activities;Therapeutic exercise;Balance training;Patient/family education    PT Next Visit Plan  DC to HEP.       Patient will benefit from skilled therapeutic intervention in order to improve the following deficits and impairments:  Decreased activity tolerance, Decreased balance, Decreased range of motion, Decreased strength, Pain, Difficulty walking  Visit Diagnosis: Chronic pain of right knee  Stiffness of right knee, not elsewhere classified  Difficulty in walking, not elsewhere classified     Problem List Patient Active Problem List   Diagnosis Date Noted  . Cervical myelopathy ([facility]) 11/15/2018  . Degenerative spondylolisthesis 01/28/2018  .  Vulvar lesion 11/09/2016  . Vulvar atrophy 11/09/2016  . Cystocele 06/08/2014  . Cancer of central portion of left female breast ([facility]) 07/21/2013  . Rectocele 06/05/2013  . Menopause 06/05/2013  . Vaginal atrophy 06/05/2013  . HTN (hypertension) 06/05/2013  . Other and unspecified hyperlipidemia 06/05/2013  . Stress incontinence in female 05/07/2012  . Female cystocele 05/07/2012   Casey Cockerham, LPTA/CLT; CBIS [phone]  Cynthia Russell, PT CLT [phone] 02/18/2020, 4:33 PM  [facility] [address] [place], [place], [address] Phone: [phone]   Fax:  [phone]  Name: Cynthia Ferguson MRN: [id number] Date of Birth: 10/03/1956   

## 2020-02-20 ENCOUNTER — Ambulatory Visit (HOSPITAL_COMMUNITY): Payer: BC Managed Care – PPO | Admitting: Physical Therapy

## 2020-06-02 ENCOUNTER — Other Ambulatory Visit: Payer: Self-pay | Admitting: Family Medicine

## 2020-06-02 DIAGNOSIS — Z1231 Encounter for screening mammogram for malignant neoplasm of breast: Secondary | ICD-10-CM

## 2020-06-17 ENCOUNTER — Ambulatory Visit
Admission: RE | Admit: 2020-06-17 | Discharge: 2020-06-17 | Disposition: A | Discharge status: Home / Self Care | Payer: PPO | Source: Ambulatory Visit | Attending: Family Medicine | Admitting: Family Medicine

## 2020-06-17 ENCOUNTER — Other Ambulatory Visit: Payer: Self-pay

## 2020-06-17 DIAGNOSIS — Z1231 Encounter for screening mammogram for malignant neoplasm of breast: Secondary | ICD-10-CM | POA: Diagnosis not present

## 2020-06-26 IMAGING — MR MR CERVICAL SPINE W/O CM
5 series · 28 of 48 positions shown · non-contrast
Comparison: Radiography 09/26/2018.  MRI 08/24/2004.

CLINICAL DATA: Generalize neck pain. Right hand and finger numbness
over the last 2-3 months. History of breast cancer.

EXAM:
MRI CERVICAL SPINE WITHOUT CONTRAST
TECHNIQUE: Multiplanar, multisequence MR imaging of the cervical spine was
performed. No intravenous contrast was administered.

[Series 2: T2 · sagittal · 3.0mm · 0.41mm/px · 6 of 13 slices shown (1 of 2)]
[im 1/13]
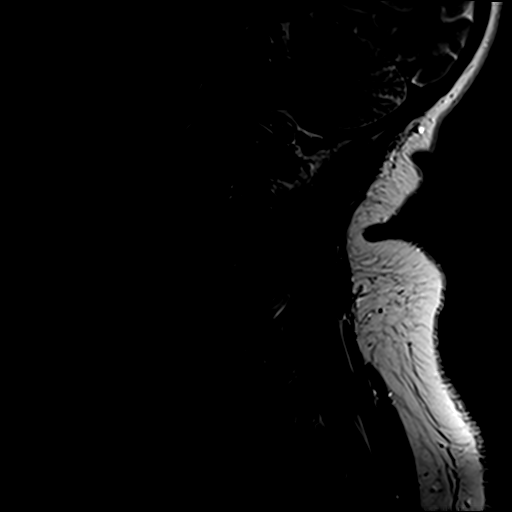
[im 3/13]
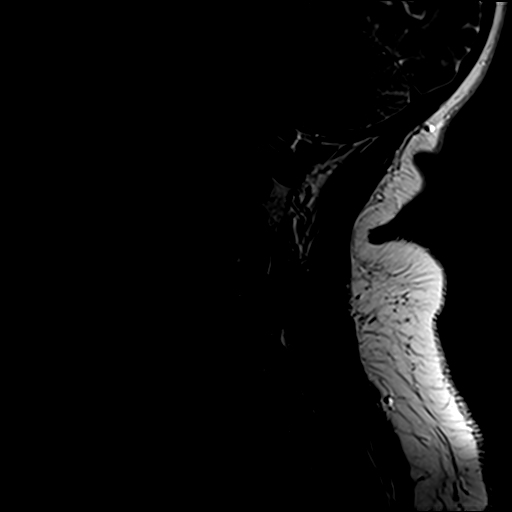
[im 5/13]
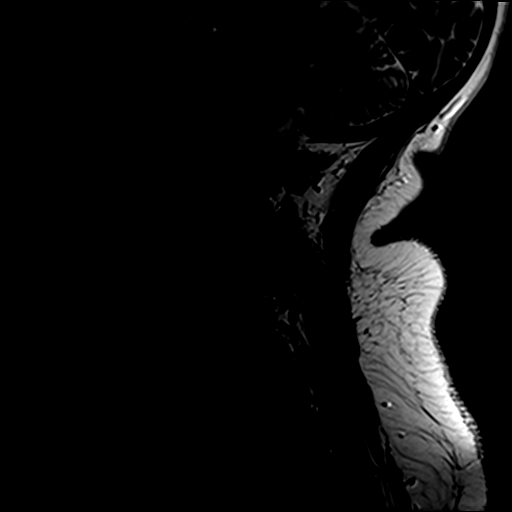
[im 8/13]
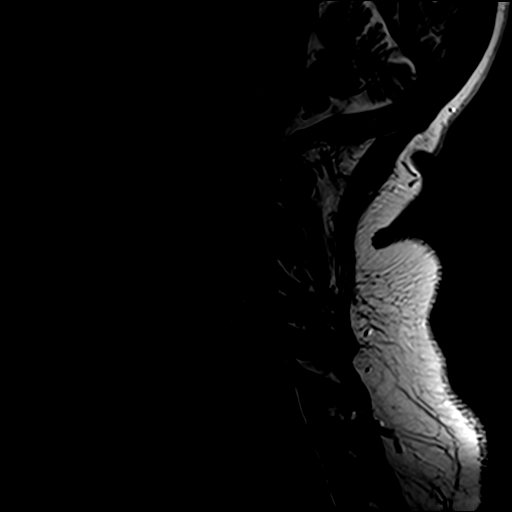
[im 10/13]
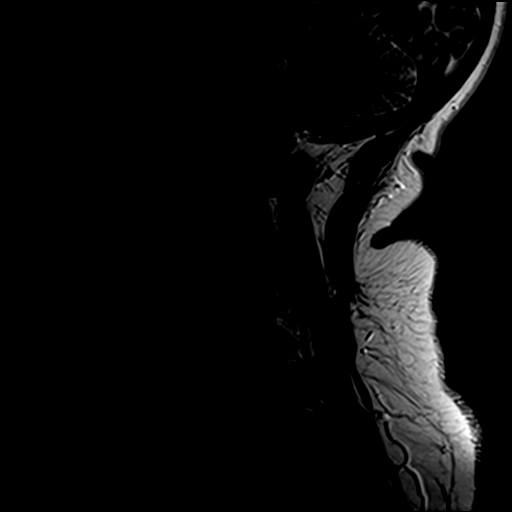
[im 13/13]
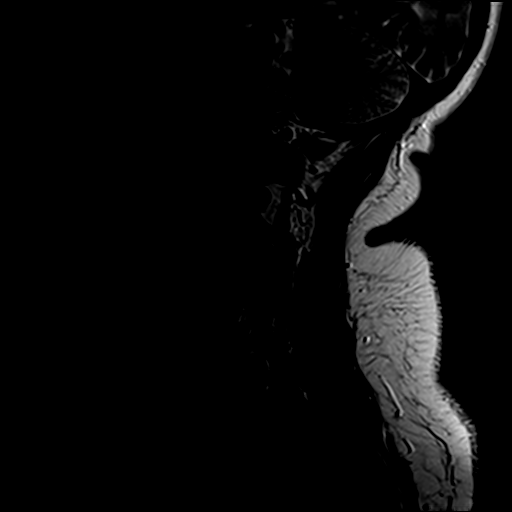

[Series 3: T1 · sagittal · 3.0mm · 0.41mm/px · 6 of 13 slices shown]
[im 1/13]
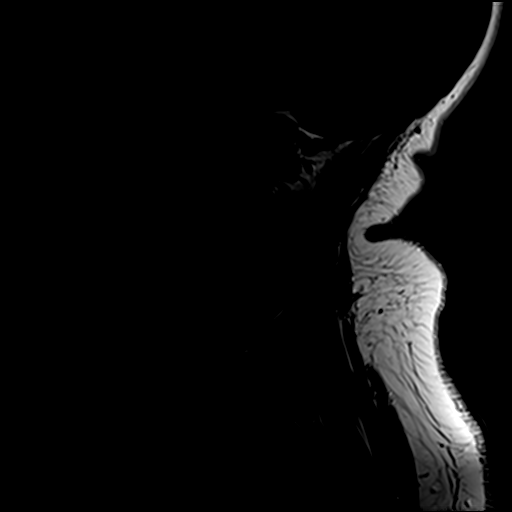
[im 3/13]
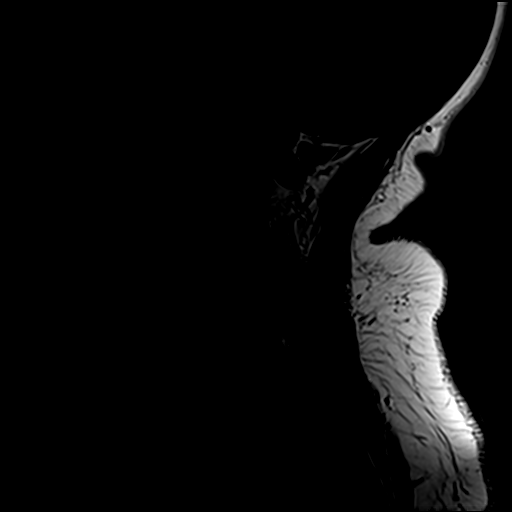
[im 5/13]
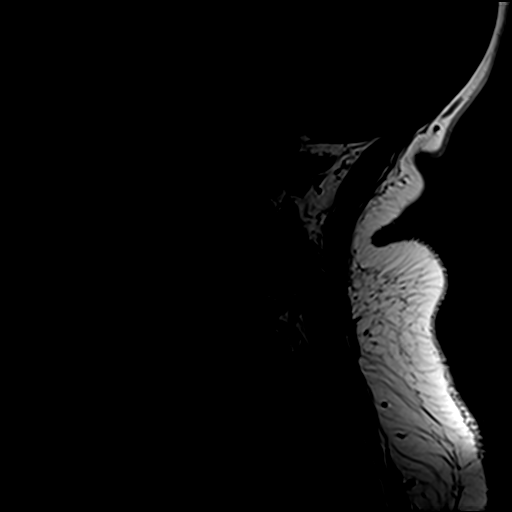
[im 8/13]
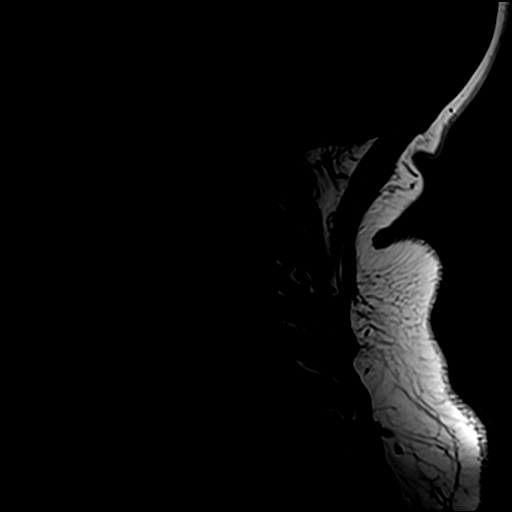
[im 10/13]
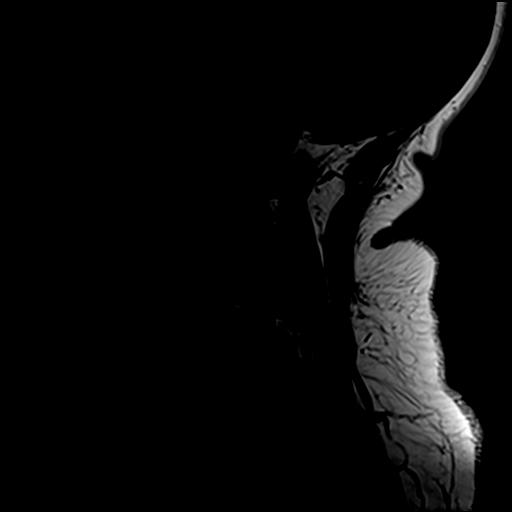
[im 13/13]
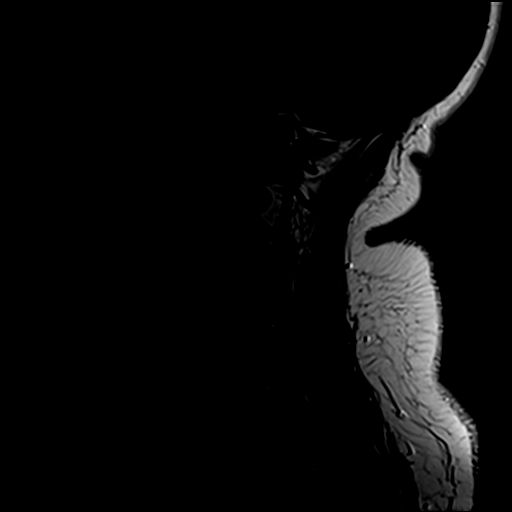

[Series 4: STIR · sagittal · 3.0mm · 0.82mm/px · 6 of 13 slices shown]
[im 1/13]
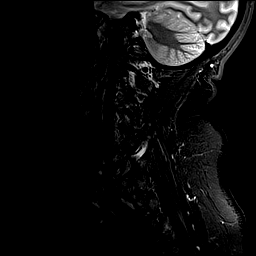
[im 3/13]
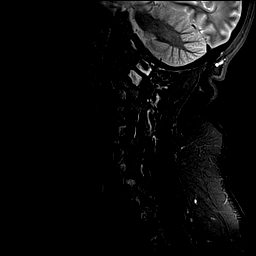
[im 5/13]
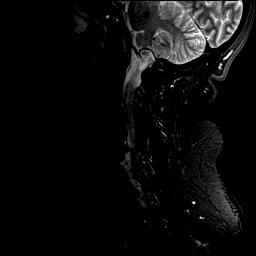
[im 8/13]
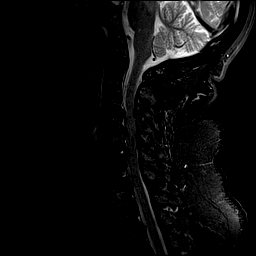
[im 10/13]
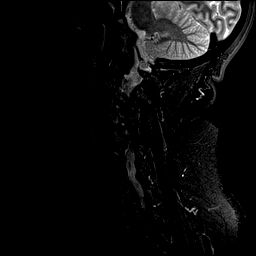
[im 13/13]
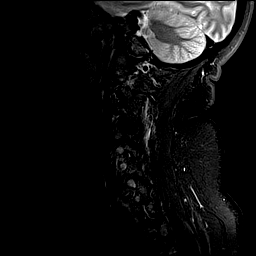

[Series 5: GRE · axial · 3.0mm · 0.35mm/px · 1 of 30 slices shown]
[im 1/30]
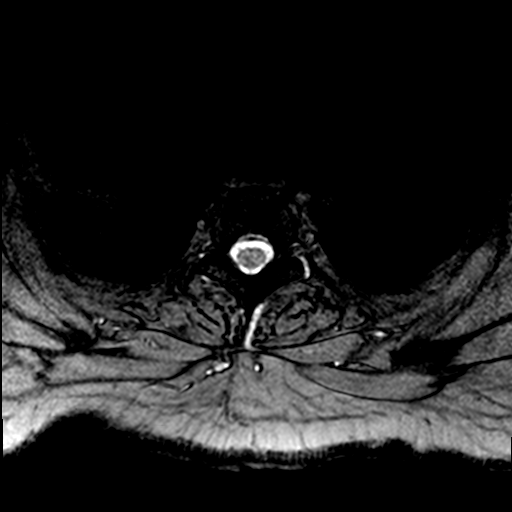

[Series 6: T2 · axial · 3.0mm · 0.70mm/px · z∈[-76,+30]mm · 9 of 30 slices shown (2 of 2)]
[im 1/30]
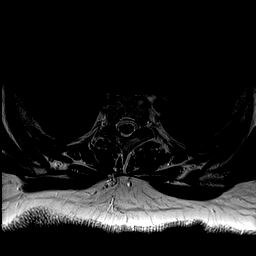
[im 5/30]
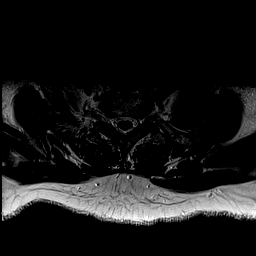
[im 9/30]
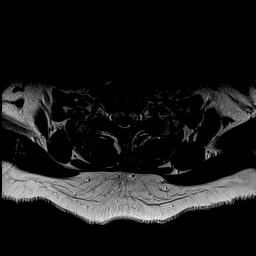
[im 13/30]
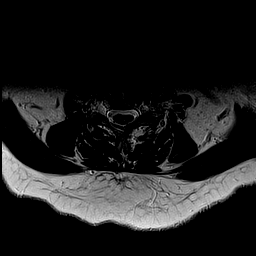
[im 15/30]
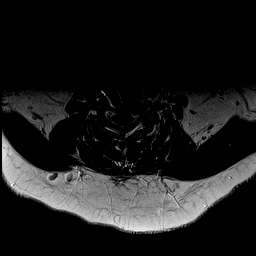
[im 17/30]
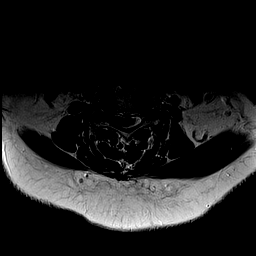
[im 21/30]
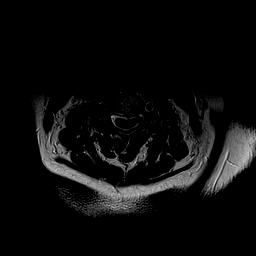
[im 25/30]
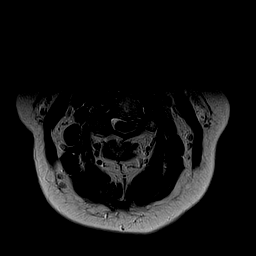
[im 30/30]
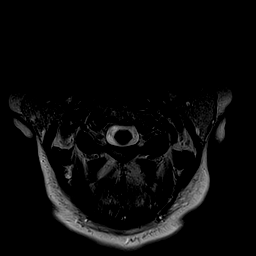

[28 of 48 positions shown; findings below may reference images not displayed]

FINDINGS: Alignment: Straightening of the normal cervical lordosis.
Retrolisthesis at C5-6 of 3 mm. Anterolisthesis at C7-T1 of 3 mm.

Vertebrae: Previous ACDF C6-7.

Cord: No primary cord lesion.  See below regarding stenosis.

Posterior Fossa, vertebral arteries, paraspinal tissues: Negative

Disc levels:

Foramen magnum is widely patent. C1-2 and C2-3 are negative.
Question possible fusion of the facets at C2-3. No canal stenosis.
Mild bony foraminal narrowing on the right at C2-3, not likely
compressive.

C3-4: Facet osteoarthritis worse on the right than the left. Central
disc herniation. Spinal stenosis with AP diameter of the canal in
the midline as narrow as 5.6 mm. Deformity of the cord. Bilateral
foraminal stenosis that could cause neural compression.

C4-5: Facet arthropathy on the right. Broad-based disc herniation
with caudal migration in the midline. Effacement of the subarachnoid
space with indentation of the cord. AP diameter of the canal as
narrow as 8.4 mm. Foraminal stenosis right worse than left.

C5-6: Retrolisthesis of 3 mm. Endplate osteophytes and shallow
protrusion of the disc. Effacement of the subarachnoid space. AP
diameter of the canal in the midline as narrow as 7.8 mm. Cord
indentation. Bilateral foraminal stenosis could compress either C6
nerve.

C6-7: Distant ACDF has a good appearance with solid union and wide
patency of the canal and foramina.

C7-T1: Facet osteoarthritis with 3 mm of anterolisthesis. No disc
herniation. No compressive narrowing of the canal or foramina.
IMPRESSION: Distant ACDF at C6-7 has a good appearance.

C3-4: Advanced bilateral facet arthropathy. Central disc herniation.
AP diameter of the canal as narrow as 5.6 mm in the midline.
Deformity of the cord which could be significant. Bilateral
foraminal stenosis that could affect either C4 nerve. This
appearance probably worsens with flexion, as slight anterolisthesis
is known to occur based on the recent radiography.

C4-5: Right-sided predominant facet arthropathy. Disc herniation
with caudal migration in the midline. AP diameter of the canal
mm. Narrowing of the subarachnoid space with slight indentation of
the cord. Bilateral foraminal stenosis that could affect either C5
nerve.

C5-6: 3 mm retrolisthesis. Endplate osteophytes and broad-based disc
protrusion. AP diameter of the canal as narrow as 7.8 mm. Cord
indentation. Bilateral foraminal stenosis could compress either C6
nerve.

C7-T1: Facet arthropathy with 3 mm of anterolisthesis. No
compressive stenosis.

C2-3: Probable posterior element fusion. Some bony foraminal
narrowing on the right but not likely compressive.

## 2020-07-22 DIAGNOSIS — M79671 Pain in right foot: Secondary | ICD-10-CM | POA: Diagnosis not present

## 2020-07-22 DIAGNOSIS — S93331D Other subluxation of right foot, subsequent encounter: Secondary | ICD-10-CM | POA: Diagnosis not present

## 2020-11-24 DIAGNOSIS — Z13820 Encounter for screening for osteoporosis: Secondary | ICD-10-CM | POA: Diagnosis not present

## 2020-11-24 DIAGNOSIS — E559 Vitamin D deficiency, unspecified: Secondary | ICD-10-CM | POA: Diagnosis not present

## 2020-11-24 DIAGNOSIS — Z780 Asymptomatic menopausal state: Secondary | ICD-10-CM | POA: Diagnosis not present

## 2020-11-24 DIAGNOSIS — E785 Hyperlipidemia, unspecified: Secondary | ICD-10-CM | POA: Diagnosis not present

## 2020-11-24 DIAGNOSIS — N1831 Chronic kidney disease, stage 3a: Secondary | ICD-10-CM | POA: Diagnosis not present

## 2020-11-24 DIAGNOSIS — I10 Essential (primary) hypertension: Secondary | ICD-10-CM | POA: Diagnosis not present

## 2020-11-24 DIAGNOSIS — Z0000 Encounter for general adult medical examination without abnormal findings: Secondary | ICD-10-CM | POA: Diagnosis not present

## 2020-11-24 DIAGNOSIS — Z9189 Other specified personal risk factors, not elsewhere classified: Secondary | ICD-10-CM | POA: Diagnosis not present

## 2020-11-24 DIAGNOSIS — F411 Generalized anxiety disorder: Secondary | ICD-10-CM | POA: Diagnosis not present

## 2020-12-21 ENCOUNTER — Other Ambulatory Visit: Payer: Self-pay | Admitting: Family Medicine

## 2020-12-23 ENCOUNTER — Other Ambulatory Visit: Payer: Self-pay | Admitting: Family Medicine

## 2020-12-23 DIAGNOSIS — Z13820 Encounter for screening for osteoporosis: Secondary | ICD-10-CM

## 2021-01-19 DIAGNOSIS — S93331D Other subluxation of right foot, subsequent encounter: Secondary | ICD-10-CM | POA: Diagnosis not present

## 2021-01-19 DIAGNOSIS — M79671 Pain in right foot: Secondary | ICD-10-CM | POA: Diagnosis not present

## 2021-03-07 DIAGNOSIS — F418 Other specified anxiety disorders: Secondary | ICD-10-CM | POA: Diagnosis not present

## 2021-03-07 DIAGNOSIS — I10 Essential (primary) hypertension: Secondary | ICD-10-CM | POA: Diagnosis not present

## 2021-03-07 DIAGNOSIS — F411 Generalized anxiety disorder: Secondary | ICD-10-CM | POA: Diagnosis not present

## 2021-03-07 DIAGNOSIS — M5090 Cervical disc disorder, unspecified, unspecified cervical region: Secondary | ICD-10-CM | POA: Diagnosis not present

## 2021-03-07 DIAGNOSIS — N390 Urinary tract infection, site not specified: Secondary | ICD-10-CM | POA: Diagnosis not present

## 2021-03-07 DIAGNOSIS — R7309 Other abnormal glucose: Secondary | ICD-10-CM | POA: Diagnosis not present

## 2021-05-20 ENCOUNTER — Other Ambulatory Visit: Payer: Self-pay | Admitting: Family Medicine

## 2021-05-20 DIAGNOSIS — Z1231 Encounter for screening mammogram for malignant neoplasm of breast: Secondary | ICD-10-CM

## 2021-05-30 ENCOUNTER — Other Ambulatory Visit: Payer: BC Managed Care – PPO

## 2021-06-22 DIAGNOSIS — I10 Essential (primary) hypertension: Secondary | ICD-10-CM | POA: Diagnosis not present

## 2021-06-22 DIAGNOSIS — F411 Generalized anxiety disorder: Secondary | ICD-10-CM | POA: Diagnosis not present

## 2021-06-22 DIAGNOSIS — N390 Urinary tract infection, site not specified: Secondary | ICD-10-CM | POA: Diagnosis not present

## 2021-06-27 ENCOUNTER — Other Ambulatory Visit: Payer: Self-pay

## 2021-06-27 ENCOUNTER — Ambulatory Visit (INDEPENDENT_AMBULATORY_CARE_PROVIDER_SITE_OTHER): Payer: PPO

## 2021-06-27 ENCOUNTER — Ambulatory Visit (INDEPENDENT_AMBULATORY_CARE_PROVIDER_SITE_OTHER): Payer: PPO | Admitting: Podiatry

## 2021-06-27 DIAGNOSIS — M2041 Other hammer toe(s) (acquired), right foot: Secondary | ICD-10-CM

## 2021-06-27 DIAGNOSIS — M2011 Hallux valgus (acquired), right foot: Secondary | ICD-10-CM | POA: Diagnosis not present

## 2021-06-27 DIAGNOSIS — T8484XA Pain due to internal orthopedic prosthetic devices, implants and grafts, initial encounter: Secondary | ICD-10-CM

## 2021-07-12 NOTE — Progress Notes (Signed)
Subjective: 65 y.o. female presenting today as a new patient for evaluation of pain and tenderness to the right forefoot.  The patient states that she does have a history of bunionectomy surgery about 9 years ago with Dr. Doran Durand, orthopedics, and she states that the foot never looked much different before surgery versus after surgery.  She continues to have pain associated to the bunion area as well as the second and third toe.  The pain is been exacerbated over the last 2 years.  She presents today for further treatment and evaluation  Past Medical History:  Diagnosis Date   Allergy    Anemia    Anxiety    Arthritis    Bladder incontinence    Cancer ([place]) 2014   left breast    Complication of anesthesia    "had trouble waking up"   GERD (gastroesophageal reflux disease)    Headache(784.0)    migraines   High cholesterol    Hypertension    Personal history of radiation therapy 2014     Objective: Physical Exam General: The patient is alert and oriented x3 in no acute distress.  Dermatology: Skin is cool, dry and supple bilateral lower extremities. Negative for open lesions or macerations.  Vascular: Palpable pedal pulses bilaterally. No edema or erythema noted. Capillary refill within normal limits.  Neurological: Epicritic and protective threshold grossly intact bilaterally.   Musculoskeletal Exam: Clinical evidence of bunion deformity noted to the respective foot. There is moderate pain on palpation to the bunion deformity however there is no pain with range of motion. Lateral deviation of the hallux noted consistent with hallux abductovalgus. Hammertoe contracture also noted on clinical exam to digits second and third digits of the right foot. Symptomatic pain on palpation and range of motion also noted to the metatarsal phalangeal joints of the respective hammertoe digits.    Radiographic Exam: Increased intermetatarsal angle greater than 15 with a hallux abductus angle  greater than 30 noted on AP view. Moderate degenerative changes noted within the first MPJ. Contracture deformity also noted to the interphalangeal joints and MPJs of the digits of the respective hammertoes.  Degenerative changes noted to the first MTPJ of the right foot however this is asymptomatic clinically    Assessment: 1. 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 Hammertoe deformity second and third digits right 3. H/o prior foot surgery, Dr. Doran Durand, 9 yrs ago 4.  Symptomatic orthopedic hardware right   Plan of Care:  1. Patient was evaluated. X-Rays reviewed. 2. Today we discussed the conservative versus surgical management of the presenting pathology. The patient opts for surgical management. All possible complications and details of the procedure were explained. All patient questions were answered. No guarantees were expressed or implied. 3. Authorization for surgery was initiated today. Surgery will consist of removal of hardware right foot.  Bunionectomy with osteotomy right.  PIPJ arthroplasty with MTPJ capsulotomy 2, 3 right.  I explained all possible approaches to the surgery specifically the bunion surgery including Lapidus procedure versus arthrodesis versus osteotomy of the bunion.  After reviewing the patient's case again I feel that she would be best served by a distal procedure versus Lapidus arthrodesis of the first metatarsal cuneiform joint.  Consent will need to be changed prior to surgery.  Patient may need to come in to sign your paperwork 4.  Return to clinic 1 week postop    Edrick Kins, DPM [facility]  Dr.  Edrick Kins, DPM    [address], [place] [address]                Office [phone]  Fax [phone]

## 2021-07-14 ENCOUNTER — Other Ambulatory Visit: Payer: Self-pay

## 2021-07-14 ENCOUNTER — Ambulatory Visit
Admission: RE | Admit: 2021-07-14 | Discharge: 2021-07-14 | Disposition: A | Discharge status: Home / Self Care | Payer: PPO | Source: Ambulatory Visit | Attending: Family Medicine | Admitting: Family Medicine

## 2021-07-14 DIAGNOSIS — Z1231 Encounter for screening mammogram for malignant neoplasm of breast: Secondary | ICD-10-CM

## 2021-07-15 ENCOUNTER — Other Ambulatory Visit: Payer: Self-pay | Admitting: Family Medicine

## 2021-07-15 DIAGNOSIS — N63 Unspecified lump in unspecified breast: Secondary | ICD-10-CM

## 2021-07-21 DIAGNOSIS — M1711 Unilateral primary osteoarthritis, right knee: Secondary | ICD-10-CM | POA: Diagnosis not present

## 2021-07-27 ENCOUNTER — Telehealth: Payer: Self-pay | Admitting: Urology

## 2021-07-27 NOTE — Telephone Encounter (Signed)
DOS - 08/25/21  DOUBLE OSTEOTOMY RIGHT --- 00923 REMOVAL FIXATION DEEP RIGHT ---20680 CAPSULOTOMY MPJ RELEASE 2,3 RIGHT --- 30076 HAMMERTOE REPAIR 2,3 RIGHT --- [phone]   HEALTH TEAM ADVANTAGE EFFECTIVE DATE - 06/06/20   RECEIVED FAX FROM [facility] STATING THAT CPT CODES 35456, 20680, 28270 AND 25638 HAS BEEN APPROVED, [place] # [id number], GOOD FROM 08/25/21 - 11/23/2021.

## 2021-07-29 DIAGNOSIS — M1711 Unilateral primary osteoarthritis, right knee: Secondary | ICD-10-CM | POA: Diagnosis not present

## 2021-07-30 ENCOUNTER — Ambulatory Visit
Admission: RE | Admit: 2021-07-30 | Discharge: 2021-07-30 | Disposition: A | Discharge status: Home / Self Care | Payer: PPO | Source: Ambulatory Visit | Attending: Family Medicine | Admitting: Family Medicine

## 2021-07-30 ENCOUNTER — Other Ambulatory Visit: Payer: Self-pay

## 2021-07-30 DIAGNOSIS — R922 Inconclusive mammogram: Secondary | ICD-10-CM | POA: Diagnosis not present

## 2021-07-30 DIAGNOSIS — N63 Unspecified lump in unspecified breast: Secondary | ICD-10-CM

## 2021-08-10 ENCOUNTER — Telehealth: Payer: Self-pay

## 2021-08-10 DIAGNOSIS — M1711 Unilateral primary osteoarthritis, right knee: Secondary | ICD-10-CM | POA: Diagnosis not present

## 2021-08-10 NOTE — Telephone Encounter (Signed)
Cynthia Ferguson called to cancel her surgery with Dr. Amalia Hailey on 08/25/2021. She stated she doesn't want to have surgery right now and will call back at a later time. Notified Dr. Amalia Hailey and Caren Griffins with [place]

## 2021-08-25 DIAGNOSIS — L237 Allergic contact dermatitis due to plants, except food: Secondary | ICD-10-CM | POA: Diagnosis not present

## 2021-08-31 ENCOUNTER — Encounter: Payer: PPO | Admitting: Podiatry

## 2021-09-07 ENCOUNTER — Encounter: Payer: PPO | Admitting: Podiatry

## 2021-09-26 ENCOUNTER — Encounter: Payer: PPO | Admitting: Podiatry

## 2021-10-20 DIAGNOSIS — F411 Generalized anxiety disorder: Secondary | ICD-10-CM | POA: Diagnosis not present

## 2021-11-16 ENCOUNTER — Other Ambulatory Visit: Payer: Self-pay

## 2021-11-16 ENCOUNTER — Ambulatory Visit: Payer: Medicare Other | Admitting: Podiatry

## 2021-11-16 DIAGNOSIS — M2011 Hallux valgus (acquired), right foot: Secondary | ICD-10-CM

## 2021-11-16 DIAGNOSIS — L989 Disorder of the skin and subcutaneous tissue, unspecified: Secondary | ICD-10-CM

## 2021-11-16 DIAGNOSIS — T8484XA Pain due to internal orthopedic prosthetic devices, implants and grafts, initial encounter: Secondary | ICD-10-CM

## 2021-11-16 DIAGNOSIS — M2041 Other hammer toe(s) (acquired), right foot: Secondary | ICD-10-CM | POA: Diagnosis not present

## 2021-11-16 NOTE — Progress Notes (Signed)
Subjective: 66 y.o. female presenting today for follow-up evaluation of pain and tenderness associated to the right forefoot.  Patient states that since last visit she had scheduled surgery however she canceled because she was hesitant to proceed and apprehensive because of the poor outcome from prior surgery.  She presents today because of a callus to the medial aspect of the right second toe is becoming very symptomatic and painful.  The entire forefoot actually hurts with shoe gear.  She has tried multiple conservative modalities including shoe gear modifications with no improvement.  She presents for further treatment and evaluation  Past Medical History:  Diagnosis Date   Allergy    Anemia    Anxiety    Arthritis    Bladder incontinence    Cancer ([place]) 2014   left breast    Complication of anesthesia    "had trouble waking up"   GERD (gastroesophageal reflux disease)    Headache(784.0)    migraines   High cholesterol    Hypertension    Personal history of radiation therapy 2014   Past Surgical History:  Procedure Laterality Date   ABDOMINAL HYSTERECTOMY     COMPLETE    ANTERIOR CERVICAL DECOMP/DISCECTOMY FUSION N/A 11/15/2018   Procedure: Anterior Cervical Decompression/Discectomy Fusion Cervical Three-Cervical Four, Cervical Four-Cervical Five, Cervical Five-Cervical Six;  Surgeon: Earnie Larsson, MD;  Location: [facility];  Service: Neurosurgery;  Laterality: N/A;  Anterior Cervical Decompression/Discectomy Fusion Cervical Three-Cervical Four, Cervical Four-Cervical Five, Cervical Five-Cervical Six   BACK SURGERY  2019   BREAST EXCISIONAL BIOPSY Left    age 16...benign   BREAST LUMPECTOMY Left 2014   BREAST LUMPECTOMY WITH NEEDLE LOCALIZATION Left 07/11/2013   Procedure: BREAST LUMPECTOMY WITH NEEDLE LOCALIZATION;  Surgeon: Rolm Bookbinder, MD;  Location: [facility];  Service: General;  Laterality: Left;  needle localization at [facility] 11:00    BREAST SURGERY     LEFT  BREAST LUMPECTOMY - [place]   LEFT FOOT   BUNIONECTOMY Right NOVEMBER 2013   HAMMER TOE   NECK SURGERY  2002   [place]   right knee athroscopy     2006   TUBAL LIGATION     Allergies  Allergen Reactions   Benzonatate Anaphylaxis   Amlodipine Swelling   Cyclobenzaprine     Other reaction(s): Dizziness   Other Rash   Dicloxacillin Rash and Other (See Comments)    Rash and numbness on face   Trimethoprim Rash     Objective: Physical Exam General: The patient is alert and oriented x3 in no acute distress.  Dermatology: Skin is cool, dry and supple bilateral lower extremities. Negative for open lesions or macerations.  Vascular: Palpable pedal pulses bilaterally. No edema or erythema noted. Capillary refill within normal limits.  Neurological: Epicritic and protective threshold grossly intact bilaterally.   Musculoskeletal Exam: Clinical evidence of bunion deformity noted to the respective foot. There is moderate pain on palpation to the bunion deformity however there is no pain with range of motion. Lateral deviation of the hallux noted consistent with hallux abductovalgus. Hammertoe contracture also noted on clinical exam to digits second and third digits of the right foot. Symptomatic pain on palpation and range of motion also noted to the metatarsal phalangeal joints of the respective hammertoe digits.    Radiographic Exam taken last visit 06/27/2021.  Assessment: 1. 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 Hammertoe deformity second and third digits  right 3. H/o prior foot surgery, Dr. Doran Durand, 9 yrs ago 4.  Symptomatic orthopedic hardware right   Plan of Care:  1. Patient was evaluated. X-Rays reviewed. 2. Today we discussed the conservative versus surgical management of the presenting pathology.  The patient originally was scheduled for surgery however she has been hesitant.  Again we discussed surgery in detail.   The patient opts for surgical management. All possible complications and details of the procedure were explained. All patient questions were answered. No guarantees were expressed or implied. 3. Authorization for surgery was reinitiated today.  Surgery will consist of removal of hardware right foot.  First MTP arthrodesis right foot.  PIPJ arthroplasty with MTP capsulotomy 2, 3 right foot. 4.  Excisional debridement of the hyperkeratotic callus lesion was performed to the medial aspect of the right second toe using a tissue nipper without incident or bleeding 5.  The patient states that she does live alone however she is able to get some help from friends and a neighbor to do her grocery shopping and assist with her postoperative recovery  6.  Return to clinic 1 week postop    Edrick Kins, DPM [facility]  Dr. Edrick Kins, DPM    [address], [place] [address]                Office [phone]  Fax [phone]

## 2021-12-06 ENCOUNTER — Telehealth: Payer: Self-pay

## 2021-12-06 NOTE — Telephone Encounter (Signed)
NOTES SCANNED TO REFERRAL 

## 2021-12-07 ENCOUNTER — Telehealth: Payer: Self-pay | Admitting: Urology

## 2021-12-07 NOTE — Telephone Encounter (Signed)
Pt called stating that she wants to cxl her sx on 01/05/22 with Dr. Amalia Hailey. Her PCP recommend that she see cardio before having sx. Her appt with cardio is on 12/30/21. She stated she will call back to reschedule sx. I have informed Dr. Amalia Hailey and Caren Griffins with this change.

## 2021-12-30 ENCOUNTER — Ambulatory Visit: Payer: PPO | Admitting: Cardiology

## 2022-01-10 NOTE — Progress Notes (Signed)
Cardiology Office Note   Date:  01/11/2022   ID:  Cynthia, Ferguson [birth year], MRN [id number]  PCP:  Curly Rim, MD  Cardiologist:   Minus Breeding, MD Referring:  Curly Rim, MD  Chief Complaint  Patient presents with   Heart Murmur      History of Present Illness: Cynthia Ferguson is a 66 y.o. female who presents for evaluation of a heart murmur.  She has no past cardiac history.  She was noted recently to have a murmur.  She has never been told about this before.  She is lost about 40 pounds in the last years through diet.  She is not able to exercise because of multiple joint problems and back pains.  With her activities that she can do she does not bring on any shortness of breath.  She does not have any PND or orthopnea.  She does not have chest pressure, neck or arm discomfort.  She does not have any palpitations, presyncope or syncope.  She says she does get some weekly hot flashes with breathlessness.  She wonders if this is panic but she says she is not an anxious person.   These happen sporadically and go away spontaneously.   Past Medical History:  Diagnosis Date   Allergy    Anemia    Anxiety    Arthritis    Bladder incontinence    Cancer ([place] [facility]) 2014   left breast    Complication of anesthesia    "had trouble waking up"   GERD (gastroesophageal reflux disease)    Headache(784.0)    migraines   High cholesterol    Hypertension    Personal history of radiation therapy 2014    Past Surgical History:  Procedure Laterality Date   ABDOMINAL HYSTERECTOMY     COMPLETE    ANTERIOR CERVICAL DECOMP/DISCECTOMY FUSION N/A 11/15/2018   Procedure: Anterior Cervical Decompression/Discectomy Fusion Cervical Three-Cervical Four, Cervical Four-Cervical Five, Cervical Five-Cervical Six;  Surgeon: Earnie Larsson, MD;  Location: [facility];  Service: Neurosurgery;  Laterality: N/A;  Anterior Cervical Decompression/Discectomy Fusion Cervical Three-Cervical Four, Cervical  Four-Cervical Five, Cervical Five-Cervical Six   BACK SURGERY  2019   BREAST EXCISIONAL BIOPSY Left    age 18...benign   BREAST LUMPECTOMY Left 2014   BREAST LUMPECTOMY WITH NEEDLE LOCALIZATION Left 07/11/2013   Procedure: BREAST LUMPECTOMY WITH NEEDLE LOCALIZATION;  Surgeon: Rolm Bookbinder, MD;  Location: [facility];  Service: General;  Laterality: Left;  needle localization at [facility] 11:00    BREAST SURGERY     LEFT BREAST LUMPECTOMY - [place]   LEFT FOOT   BUNIONECTOMY Right NOVEMBER 2013   HAMMER TOE   NECK SURGERY  2002   [place]   right knee athroscopy     2006   TUBAL LIGATION       Current Outpatient Medications  Medication Sig Dispense Refill   ALPRAZolam (XANAX) 0.5 MG tablet Take 0.25-0.5 mg by mouth See admin instructions. Take 0.25 mg by mouth in the daytime if needed and 0.'5mg'$  at bedtime     Biotin w/ Vitamins C & E (HAIR/SKIN/NAILS PO) Take 1 tablet by mouth daily.     Cholecalciferol (VITAMIN D3) 1.25 MG (50000 UT) CAPS Vitamin D3     diphenhydrAMINE (BENADRYL) 25 MG tablet Take 25 mg by mouth every 8 (eight) hours as needed for allergies.      hydrochlorothiazide (HYDRODIURIL) 25 MG tablet Take  25 mg by mouth daily.     losartan (COZAAR) 100 MG tablet Take 100 mg by mouth daily.     Multiple Vitamins-Calcium (ONE-A-DAY WOMENS FORMULA PO) Take 1 tablet by mouth daily.     nitrofurantoin (MACRODANTIN) 100 MG capsule Take 100 mg by mouth at bedtime. UTI Prevention      omeprazole (PRILOSEC) 20 MG capsule Take 20 mg by mouth daily.     Propylene Glycol 0.6 % SOLN Place 1 drop into both eyes 3 (three) times daily as needed (dry eyes).     simvastatin (ZOCOR) 40 MG tablet Take 40 mg by mouth at bedtime.     acetaminophen (TYLENOL) 325 MG tablet Take 650 mg by mouth every 6 (six) hours as needed for mild pain or moderate pain.  (Patient not taking: Reported on 01/11/2022)     aspirin-acetaminophen-caffeine (EXCEDRIN MIGRAINE)  250-250-65 MG tablet Take 2 tablets by mouth every 8 (eight) hours as needed for headache. (Patient not taking: Reported on 01/11/2022) 30 tablet 0   calcium-vitamin D (OSCAL) 250-125 MG-UNIT per tablet Take 2 tablets by mouth 2 (two) times daily.  (Patient not taking: Reported on 01/11/2022)     cetirizine (ZYRTEC) 10 MG tablet Take 10 mg by mouth at bedtime. (Patient not taking: Reported on 01/11/2022)     cyclobenzaprine (FLEXERIL) 10 MG tablet Take 1 tablet (10 mg total) by mouth 3 (three) times daily as needed for muscle spasms. (Patient not taking: Reported on 01/11/2022) 90 tablet 0   diclofenac (VOLTAREN) 50 MG EC tablet Take 1 tablet (50 mg total) by mouth 2 (two) times daily. (Patient not taking: Reported on 01/11/2022)     naproxen sodium (ALEVE) 220 MG tablet Take 2 tablets (440 mg total) by mouth daily as needed (pain). (Patient not taking: Reported on 01/11/2022)     polyethylene glycol (MIRALAX / GLYCOLAX) packet Take 17 g by mouth daily as needed for mild constipation.  (Patient not taking: Reported on 01/11/2022)     polyethylene glycol powder (GLYCOLAX/MIRALAX) 17 GM/SCOOP powder Take 1 Container by mouth once. (Patient not taking: Reported on 01/11/2022)     No current facility-administered medications for this visit.    Allergies:   Benzonatate, Amlodipine, Cyclobenzaprine, Other, Dicloxacillin, and Trimethoprim    Social History:  The patient  reports that she has never smoked. She has never used smokeless tobacco. She reports that she does not drink alcohol and does not use drugs.   Family History:  The patient's family history includes Atrial fibrillation in her son; Breast cancer in her maternal aunt and paternal aunt; CAD (age of onset: 41) in her brother; Cancer in her father; Diabetes in her mother; Heart attack in her sister; Heart disease (age of onset: 49) in her mother; Heart disease (age of onset: 23) in her father; Hypertension in her brother, father, mother, sister, and sister;  Liver cancer in her father; Lung cancer in her father.  There is a significant family history of atrial fibrillation.  One sibling has a pacemaker.   ROS:  Please see the history of present illness.   Otherwise, review of systems are positive for none.   All other systems are reviewed and negative.    PHYSICAL EXAM: VS:  BP 115/72    Pulse 73    Ht 5' (1.524 m)    Wt 130 lb 9.6 oz (59.2 kg)    SpO2 98%    BMI 25.51 kg/m  , BMI Body mass index is 25.51 kg/m.  GENERAL:  Well appearing HEENT:  Pupils equal round and reactive, fundi not visualized, oral mucosa unremarkable NECK:  No jugular venous distention, waveform within normal limits, carotid upstroke brisk and symmetric, bilateral carotid bruits, no thyromegaly LYMPHATICS:  No cervical, inguinal adenopathy LUNGS:  Clear to auscultation bilaterally BACK:  No CVA tenderness CHEST:  Unremarkable HEART:  PMI not displaced or sustained,S1 and S2 within normal limits, no S3, no S4, no clicks, no rubs, 3 out of 6 systolic murmur early peaking and radiating at the aortic outflow tract, no diastolic murmurs ABD:  Flat, positive bowel sounds normal in frequency in pitch, no bruits, no rebound, no guarding, no midline pulsatile mass, no hepatomegaly, no splenomegaly EXT:  2 plus pulses throughout, no edema, no cyanosis no clubbing SKIN:  No rashes no nodules NEURO:  Cranial nerves II through XII grossly intact, motor grossly intact throughout PSYCH:  Cognitively intact, oriented to person place and time    EKG:  EKG is ordered today. The ekg ordered today demonstrates sinus rhythm, rate 73, axis within normal limits, intervals within normal limits, no acute ST-T wave changes.   Recent Labs: No results found for requested labs within last 8760 hours.    Lipid Panel    Component Value Date/Time   CHOL 170 06/29/2015 0953   TRIG 78 06/29/2015 0953   HDL 53 06/29/2015 0953   CHOLHDL 3.2 06/29/2015 0953   VLDL 16 06/29/2015 0953   LDLCALC  101 06/29/2015 0953      Wt Readings from Last 3 Encounters:  01/11/22 130 lb 9.6 oz (59.2 kg)  09/03/19 178 lb 9.6 oz (81 kg)  05/08/19 170 lb (77.1 kg)      Other studies Reviewed: Additional studies/ records that were reviewed today include: Labs. Review of the above records demonstrates:  Please see elsewhere in the note.     ASSESSMENT AND PLAN:  Murmur: I suspect mild aortic stenosis and will check an echocardiogram.  Dyslipidemia: LDL was 81 but HDL 79.  No change in therapy.  Hypertension: Her blood pressure is well controlled.  Continue meds as listed.  CKD: Creatinine was mildly elevated at 1.4.  No change in therapy.  BRUIT: I will check bilateral carotid Dopplers.  SOB: She has episodic shortness of breath not exertional.  I will consider further work-up of this given her family history once I do see in the echocardiogram.  Current medicines are reviewed at length with the patient today.  The patient does not have concerns regarding medicines.  The following changes have been made:  no change  Labs/ tests ordered today include:   Orders Placed This Encounter  Procedures   EKG 12-Lead   ECHOCARDIOGRAM COMPLETE   VAS US CAROTID     Disposition:   FU with me after the echo.   Signed, Minus Breeding, MD  01/11/2022 11:46 AM    [facility]

## 2022-01-11 ENCOUNTER — Encounter: Payer: Medicare Other | Admitting: Podiatry

## 2022-01-11 ENCOUNTER — Encounter: Payer: Self-pay | Admitting: Cardiology

## 2022-01-11 ENCOUNTER — Ambulatory Visit: Payer: Medicare Other | Admitting: Cardiology

## 2022-01-11 ENCOUNTER — Other Ambulatory Visit: Payer: Self-pay

## 2022-01-11 VITALS — BP 115/72 | HR 73 | Ht 60.0 in | Wt 130.6 lb

## 2022-01-11 DIAGNOSIS — E785 Hyperlipidemia, unspecified: Secondary | ICD-10-CM | POA: Diagnosis not present

## 2022-01-11 DIAGNOSIS — R0989 Other specified symptoms and signs involving the circulatory and respiratory systems: Secondary | ICD-10-CM | POA: Diagnosis not present

## 2022-01-11 DIAGNOSIS — I10 Essential (primary) hypertension: Secondary | ICD-10-CM

## 2022-01-11 DIAGNOSIS — R011 Cardiac murmur, unspecified: Secondary | ICD-10-CM

## 2022-01-11 NOTE — Patient Instructions (Addendum)
Medication Instructions:  ?No changes ? ? ?*If you need a refill on your cardiac medications before your next appointment, please call your pharmacy* ? ? ?Lab Work: ?Not needed ? ? ? ?Testing/Procedures: ? Will be schedule at [address] ?Your physician has requested that you have an echocardiogram. Echocardiography is a painless test that uses sound waves to create images of your heart. It provides your doctor with information about the size and shape of your heart and how well your heart?s chambers and valves are working. This procedure takes approximately one hour. There are no restrictions for this procedure. ? ?And ?Will be schedule at [address] ?Your physician has requested that you have a carotid duplex. This test is an ultrasound of the carotid arteries in your neck. It looks at blood flow through these arteries that supply the brain with blood. Allow one hour for this exam. There are no restrictions or special instructions.  ? ?Follow-Up: ?At [facility], you and your health needs are our priority.  As part of our continuing mission to provide you with exceptional heart care, we have created designated Provider Care Teams.  These Care Teams include your primary Cardiologist (physician) and Advanced Practice Providers (APPs -  Physician Assistants and Nurse Practitioners) who all work together to provide you with the care you need, when you need it. ? ?  ? ?Your next appointment:   ?2 to 3 month(s) ? ?The format for your next appointment:   ?In Person ? ?Provider:   ?Minus Breeding, MD  ?

## 2022-01-18 ENCOUNTER — Encounter: Payer: Medicare Other | Admitting: Podiatry

## 2022-01-26 ENCOUNTER — Other Ambulatory Visit: Payer: Self-pay

## 2022-01-26 ENCOUNTER — Ambulatory Visit (HOSPITAL_COMMUNITY): Payer: Medicare Other | Attending: Cardiovascular Disease

## 2022-01-26 DIAGNOSIS — R011 Cardiac murmur, unspecified: Secondary | ICD-10-CM | POA: Insufficient documentation

## 2022-01-26 LAB — ECHOCARDIOGRAM COMPLETE
AR max vel: 1.8 cm2
AV Area VTI: 1.78 cm2
AV Area mean vel: 1.64 cm2
AV Mean grad: 7 mmHg
AV Peak grad: 13 mmHg
Ao pk vel: 1.8 m/s
Area-P 1/2: 3.46 cm2
P 1/2 time: 521 msec
S' Lateral: 2.2 cm

## 2022-01-31 ENCOUNTER — Ambulatory Visit (HOSPITAL_COMMUNITY)
Admission: RE | Admit: 2022-01-31 | Discharge: 2022-01-31 | Disposition: A | Discharge status: Home / Self Care | Payer: Medicare Other | Source: Ambulatory Visit | Attending: Cardiology | Admitting: Cardiology

## 2022-01-31 ENCOUNTER — Other Ambulatory Visit: Payer: Self-pay

## 2022-01-31 ENCOUNTER — Encounter: Payer: Self-pay | Admitting: *Deleted

## 2022-01-31 DIAGNOSIS — R0989 Other specified symptoms and signs involving the circulatory and respiratory systems: Secondary | ICD-10-CM | POA: Diagnosis not present

## 2022-02-01 ENCOUNTER — Encounter: Payer: Medicare Other | Admitting: Podiatry

## 2022-02-06 ENCOUNTER — Encounter: Payer: Self-pay | Admitting: *Deleted

## 2022-02-08 ENCOUNTER — Encounter: Payer: Medicare Other | Admitting: Podiatry

## 2022-02-22 ENCOUNTER — Encounter: Payer: Medicare Other | Admitting: Podiatry

## 2022-04-10 ENCOUNTER — Ambulatory Visit: Payer: Medicare Other | Admitting: Cardiology

## 2022-04-17 ENCOUNTER — Ambulatory Visit: Payer: Medicare Other | Admitting: Podiatry

## 2022-04-17 DIAGNOSIS — L989 Disorder of the skin and subcutaneous tissue, unspecified: Secondary | ICD-10-CM

## 2022-04-17 NOTE — Progress Notes (Signed)
Subjective: 66 y.o. female presenting today for follow-up evaluation of pain and tenderness associated to the right forefoot.  Patient has developed symptomatic calluses to the bilateral feet and she would like them debrided today.  She was originally scheduled for surgery however the patient is very nervous and did not want to pursue surgery at this time.  She presents today for conservative care and treatment.  Past Medical History:  Diagnosis Date   Allergy    Anemia    Anxiety    Arthritis    Bladder incontinence    Cancer ([place]) 2014   left breast    Complication of anesthesia    "had trouble waking up"   GERD (gastroesophageal reflux disease)    Headache(784.0)    migraines   High cholesterol    Hypertension    Personal history of radiation therapy 2014   Past Surgical History:  Procedure Laterality Date   ABDOMINAL HYSTERECTOMY     COMPLETE    ANTERIOR CERVICAL DECOMP/DISCECTOMY FUSION N/A 11/15/2018   Procedure: Anterior Cervical Decompression/Discectomy Fusion Cervical Three-Cervical Four, Cervical Four-Cervical Five, Cervical Five-Cervical Six;  Surgeon: Earnie Larsson, MD;  Location: [facility];  Service: Neurosurgery;  Laterality: N/A;  Anterior Cervical Decompression/Discectomy Fusion Cervical Three-Cervical Four, Cervical Four-Cervical Five, Cervical Five-Cervical Six   BACK SURGERY  2019   BREAST EXCISIONAL BIOPSY Left    age 61...benign   BREAST LUMPECTOMY Left 2014   BREAST LUMPECTOMY WITH NEEDLE LOCALIZATION Left 07/11/2013   Procedure: BREAST LUMPECTOMY WITH NEEDLE LOCALIZATION;  Surgeon: Rolm Bookbinder, MD;  Location: [facility];  Service: General;  Laterality: Left;  needle localization at [facility] 11:00    BREAST SURGERY     LEFT BREAST LUMPECTOMY - [place]   LEFT FOOT   BUNIONECTOMY Right NOVEMBER 2013   HAMMER TOE   NECK SURGERY  2002   [place]   right knee athroscopy     2006   TUBAL LIGATION     Allergies   Allergen Reactions   Benzonatate Anaphylaxis   Amlodipine Swelling   Cyclobenzaprine     Other reaction(s): Dizziness   Other Rash   Dicloxacillin Rash and Other (See Comments)    Rash and numbness on face   Trimethoprim Rash     Objective: Physical Exam General: The patient is alert and oriented x3 in no acute distress.  Dermatology: Skin is cool, dry and supple bilateral lower extremities. Negative for open lesions or macerations.  Hyperkeratotic symptomatic calluses noted to the bilateral feet  Vascular: Palpable pedal pulses bilaterally. No edema or erythema noted. Capillary refill within normal limits.  Neurological: Epicritic and protective threshold grossly intact bilaterally.   Musculoskeletal Exam: Clinical evidence of bunion deformity noted to the respective foot. There is moderate pain on palpation to the bunion deformity however there is no pain with range of motion. Lateral deviation of the hallux noted consistent with hallux abductovalgus. Hammertoe contracture also noted on clinical exam to digits second and third digits of the right foot. Symptomatic pain on palpation and range of motion also noted to the metatarsal phalangeal joints of the respective hammertoe digits.    Radiographic Exam taken last visit 06/27/2021.  Assessment: 1. 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 Hammertoe deformity second and third digits right 3. H/o prior foot surgery, Dr. Doran Durand, 9 yrs ago 4.  Symptomatic orthopedic hardware right 5.  Symptomatic calluses bilateral feet  Plan of  Care:  1. Patient was evaluated.  2.  Again today we discussed the conservative versus surgical management of the presenting pathology.  The patient originally was scheduled for surgery however she has been hesitant.  3.  The patient states that currently she is very scared of surgery and does not want to pursue any surgical management at the moment.  We will continue conservative care 4.   Excisional debridement of the hyperkeratotic callus lesion was performed to the medial aspect of the right second toe using a tissue nipper without incident or bleeding 5.  Return to clinic as needed    Edrick Kins, DPM [facility]  Dr. Edrick Kins, DPM    [address], [place] [address]                Office [phone]  Fax [phone]

## 2022-06-28 ENCOUNTER — Other Ambulatory Visit: Payer: Self-pay | Admitting: Family Medicine

## 2022-06-28 DIAGNOSIS — Z1231 Encounter for screening mammogram for malignant neoplasm of breast: Secondary | ICD-10-CM

## 2022-07-31 ENCOUNTER — Ambulatory Visit: Payer: Medicare Other

## 2022-08-17 ENCOUNTER — Ambulatory Visit
Admission: RE | Admit: 2022-08-17 | Discharge: 2022-08-17 | Disposition: A | Discharge status: Home / Self Care | Payer: Medicare Other | Source: Ambulatory Visit | Attending: Family Medicine | Admitting: Family Medicine

## 2022-08-17 DIAGNOSIS — Z1231 Encounter for screening mammogram for malignant neoplasm of breast: Secondary | ICD-10-CM

## 2022-12-20 ENCOUNTER — Ambulatory Visit: Payer: Medicare Other | Admitting: Podiatry

## 2022-12-20 DIAGNOSIS — M2011 Hallux valgus (acquired), right foot: Secondary | ICD-10-CM

## 2022-12-20 NOTE — Progress Notes (Signed)
Subjective: 67 y.o. female presenting today for follow-up evaluation of pain and tenderness associated to the right forefoot.  Patient has developed symptomatic calluses to the bilateral feet and she would like them debrided today.  She was originally scheduled for surgery however the patient is very nervous and did not want to pursue surgery at this time.  She presents today for conservative care and treatment.  Past Medical History:  Diagnosis Date   Allergy    Anemia    Anxiety    Arthritis    Bladder incontinence    Cancer ([place]) 2014   left breast    Complication of anesthesia    "had trouble waking up"   GERD (gastroesophageal reflux disease)    Headache(784.0)    migraines   High cholesterol    Hypertension    Personal history of radiation therapy 2014   Past Surgical History:  Procedure Laterality Date   ABDOMINAL HYSTERECTOMY     COMPLETE    ANTERIOR CERVICAL DECOMP/DISCECTOMY FUSION N/A 11/15/2018   Procedure: Anterior Cervical Decompression/Discectomy Fusion Cervical Three-Cervical Four, Cervical Four-Cervical Five, Cervical Five-Cervical Six;  Surgeon: Earnie Larsson, MD;  Location: [facility];  Service: Neurosurgery;  Laterality: N/A;  Anterior Cervical Decompression/Discectomy Fusion Cervical Three-Cervical Four, Cervical Four-Cervical Five, Cervical Five-Cervical Six   BACK SURGERY  2019   BREAST EXCISIONAL BIOPSY Left    age 35...benign   BREAST LUMPECTOMY Left 2014   BREAST LUMPECTOMY WITH NEEDLE LOCALIZATION Left 07/11/2013   Procedure: BREAST LUMPECTOMY WITH NEEDLE LOCALIZATION;  Surgeon: Rolm Bookbinder, MD;  Location: [facility];  Service: General;  Laterality: Left;  needle localization at [facility] 11:00    BREAST SURGERY     LEFT BREAST LUMPECTOMY - [place]   LEFT FOOT   BUNIONECTOMY Right NOVEMBER 2013   HAMMER TOE   NECK SURGERY  2002   [employer]   right knee athroscopy     2006   TUBAL LIGATION     Allergies   Allergen Reactions   Benzonatate Anaphylaxis   Amlodipine Swelling   Cyclobenzaprine     Other reaction(s): Dizziness   Other Rash   Dicloxacillin Rash and Other (See Comments)    Rash and numbness on face   Trimethoprim Rash     Objective: Physical Exam General: The patient is alert and oriented x3 in no acute distress.  Dermatology: Skin is cool, dry and supple bilateral lower extremities. Negative for open lesions or macerations.  Hyperkeratotic symptomatic calluses noted to the bilateral feet  Vascular: Palpable pedal pulses bilaterally. No edema or erythema noted. Capillary refill within normal limits.  Neurological: Epicritic and protective threshold grossly intact bilaterally.   Musculoskeletal Exam: Clinical evidence of bunion deformity noted to the respective foot. There is moderate pain on palpation to the bunion deformity however there is no pain with range of motion. Lateral deviation of the hallux noted consistent with hallux abductovalgus. Hammertoe contracture also noted on clinical exam to digits second and third digits of the right foot. Symptomatic pain on palpation and range of motion also noted to the metatarsal phalangeal joints of the respective hammertoe digits.    Radiographic Exam taken last visit 06/27/2021.  Assessment: 1. 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 Hammertoe deformity second and third digits right 3. H/o prior foot surgery, Dr. Doran Durand, 9 yrs ago 4.  Symptomatic orthopedic hardware right 5.  Symptomatic calluses bilateral feet  Plan of  Care:  1. Patient was evaluated.  2.  Again today we discussed the conservative versus surgical management of the presenting pathology.  The patient originally was scheduled for surgery however she has been hesitant.  3.  The patient states that currently she is very scared of surgery and does not want to pursue any surgical management at the moment.  We will continue conservative care 4.   Excisional debridement of the hyperkeratotic callus lesion was performed to the medial aspect of the right second toe using a tissue nipper without incident or bleeding 5.  Return to clinic as needed    Edrick Kins, DPM [facility]  Dr. Edrick Kins, DPM    [address], [place] [address]                Office [phone]  Fax [phone]

## 2023-04-06 LAB — COLOGUARD: COLOGUARD: NEGATIVE

## 2023-05-06 ENCOUNTER — Other Ambulatory Visit: Payer: Self-pay

## 2023-05-06 ENCOUNTER — Encounter (HOSPITAL_COMMUNITY): Payer: Self-pay | Admitting: Emergency Medicine

## 2023-05-06 ENCOUNTER — Emergency Department (HOSPITAL_COMMUNITY)
Admission: EM | Admit: 2023-05-06 | Discharge: 2023-05-06 | Disposition: A | Discharge status: Home / Self Care | Payer: Medicare Other | Attending: Emergency Medicine | Admitting: Emergency Medicine

## 2023-05-06 DIAGNOSIS — Z853 Personal history of malignant neoplasm of breast: Secondary | ICD-10-CM | POA: Diagnosis not present

## 2023-05-06 DIAGNOSIS — I10 Essential (primary) hypertension: Secondary | ICD-10-CM | POA: Insufficient documentation

## 2023-05-06 DIAGNOSIS — Z79899 Other long term (current) drug therapy: Secondary | ICD-10-CM | POA: Insufficient documentation

## 2023-05-06 DIAGNOSIS — H5711 Ocular pain, right eye: Secondary | ICD-10-CM | POA: Diagnosis present

## 2023-05-06 MED ORDER — FLUORESCEIN SODIUM 1 MG OP STRP
1.0000 | ORAL_STRIP | Freq: Once | OPHTHALMIC | Status: AC
  Administered 2023-05-06: 1 via OPHTHALMIC

## 2023-05-06 NOTE — ED Provider Notes (Signed)
[place] EMERGENCY DEPARTMENT AT [facility] Provider Note   CSN: [id number] Arrival date & time: 05/06/23  1140     History  No chief complaint on file.   LILITH LESIAK is a 67 y.o. female history of breast cancer, hypertension, cervical myelopathy presented with right eye pain for the past 2 days.  Patient states that she woke up and noticed that her right eye hurt when she moved it.  Patient states that she felt a popping sensation on the outside of her right eye shortly afterwards and since then feels that her eye has been pain-free. Patient states touching her eye will exacerbate the pain. Patient denies any fevers, recent illnesses, vision changes, headache, foreign body sensation, flashes of light, floaters, pain in dark rooms. Patient wears glasses but not contacts.  Patient went to urgent care before here and was told she had a "blip" on her eye that needed to be evaluated but is unsure what this blip is.  Patient had visual acuity done in which she was told it was reassuring and patient does not endorse any vision changes.  Home Medications Prior to Admission medications   Medication Sig Start Date End Date Taking? Authorizing Provider  acetaminophen (TYLENOL) 325 MG tablet Take 650 mg by mouth every 6 (six) hours as needed for mild pain or moderate pain.  Patient not taking: Reported on 01/11/2022    [provider]  ALPRAZolam Prudy Feeler) 0.5 MG tablet Take 0.25-0.5 mg by mouth See admin instructions. Take 0.25 mg by mouth in the daytime if needed and 0.5mg  at bedtime    [provider]  aspirin-acetaminophen-caffeine (EXCEDRIN MIGRAINE) [phone] MG tablet Take 2 tablets by mouth every 8 (eight) hours as needed for headache. Patient not taking: Reported on 01/11/2022 11/20/18   Alyson Ingles, PA-C  Biotin w/ Vitamins C & E (HAIR/SKIN/NAILS PO) Take 1 tablet by mouth daily.    [provider]  calcium-vitamin D (OSCAL) 250-125 MG-UNIT per  tablet Take 2 tablets by mouth 2 (two) times daily.  Patient not taking: Reported on 01/11/2022    [provider]  cetirizine (ZYRTEC) 10 MG tablet Take 10 mg by mouth at bedtime. Patient not taking: Reported on 01/11/2022    [provider]  Cholecalciferol (VITAMIN D3) 1.25 MG (50000 UT) CAPS Vitamin D3    [provider]  cyclobenzaprine (FLEXERIL) 10 MG tablet Take 1 tablet (10 mg total) by mouth 3 (three) times daily as needed for muscle spasms. Patient not taking: Reported on 01/11/2022 11/16/18   Alyson Ingles, PA-C  diclofenac (VOLTAREN) 50 MG EC tablet Take 1 tablet (50 mg total) by mouth 2 (two) times daily. Patient not taking: Reported on 01/11/2022 11/21/18   Alyson Ingles, PA-C  diphenhydrAMINE (BENADRYL) 25 MG tablet Take 25 mg by mouth every 8 (eight) hours as needed for allergies.     [provider]  hydrochlorothiazide (HYDRODIURIL) 25 MG tablet Take 25 mg by mouth daily.    [provider]  losartan (COZAAR) 100 MG tablet Take 100 mg by mouth daily.    [provider]  Multiple Vitamins-Calcium (ONE-A-DAY WOMENS FORMULA PO) Take 1 tablet by mouth daily.    [provider]  naproxen sodium (ALEVE) 220 MG tablet Take 2 tablets (440 mg total) by mouth daily as needed (pain). Patient not taking: Reported on 01/11/2022 11/21/18   Alyson Ingles, PA-C  nitrofurantoin (MACRODANTIN) 100 MG capsule Take 100 mg by mouth at  bedtime. UTI Prevention     [provider]  omeprazole (PRILOSEC) 20 MG capsule Take 20 mg by mouth daily.    [provider]  polyethylene glycol (MIRALAX / GLYCOLAX) packet Take 17 g by mouth daily as needed for mild constipation.  Patient not taking: Reported on 01/11/2022    [provider]  polyethylene glycol powder (GLYCOLAX/MIRALAX) 17 GM/SCOOP powder Take 1 Container by mouth once. Patient not taking: Reported on 01/11/2022    [provider]  Propylene Glycol  0.6 % SOLN Place 1 drop into both eyes 3 (three) times daily as needed (dry eyes).    [provider]  simvastatin (ZOCOR) 40 MG tablet Take 40 mg by mouth at bedtime. 08/01/18   [provider]      Allergies    Benzonatate, Amlodipine, Cyclobenzaprine, Other, Dicloxacillin, and Trimethoprim    Review of Systems   Review of Systems See HPI Physical Exam Updated Vital Signs BP (!) 170/69 (BP Location: Right Arm)   Pulse 73   Temp 98.7 F (37.1 C) (Oral)   Resp 18   Ht 5' (1.524 m)   Wt 59 kg   SpO2 100%   BMI 25.39 kg/m  Physical Exam Constitutional:      General: She is not in acute distress. Eyes:     General: Lids are normal. Lids are everted, no foreign bodies appreciated. Vision grossly intact. Gaze aligned appropriately. No visual field deficit.       Right eye: No foreign body, discharge or hordeolum.     Intraocular pressure: Right eye pressure is 14 mmHg.     Extraocular Movements: Extraocular movements intact.     Right eye: Normal extraocular motion and no nystagmus.     Conjunctiva/sclera: Conjunctivae normal.     Pupils: Pupils are equal, round, and reactive to light.     Right eye: No corneal abrasion or fluorescein uptake. Seidel exam negative.     Funduscopic exam:    Right eye: No hemorrhage, exudate, AV nicking or papilledema. Red reflex present.     Comments: Visual fields intact No erythema or edema noted around the eyes No painful EOM  Neurological:     Mental Status: She is alert.     ED Results / Procedures / Treatments   Labs (all labs ordered are listed, but only abnormal results are displayed) Labs Reviewed - No data to display  EKG None  Radiology No results found.  Procedures Ultrasound ED Ocular  Date/Time: 05/06/2023 3:00 PM  Performed by: Netta Corrigan, PA-C Authorized by: Netta Corrigan, PA-C   PROCEDURE DETAILS:    Indications: eye pain     Assessed:  Right eye   Right eye axial view: obtained      Right eye sagittal view: obtained     Images: archived     Limitations:  None RIGHT EYE FINDINGS:     no foreign body noted in right eye    right eye lens not dislodged    no right eye increased optic nerve sheath diameter    no retrobulbar hematoma in right eye    no evidence of retinal detachment of the right eye    no ruptured globe in right eye    no vitreous hemorrhage in right eye     Medications Ordered in ED Medications  fluorescein ophthalmic strip 1 strip (1 strip Right Eye Given 05/06/23 1300)    ED Course/ Medical Decision Making/ A&P  Medical Decision Making  KAAMILYA STCHARLES 67 y.o. presented today for right eye pain. Working DDx that I considered at this time includes, but not limited to, preorbital/orbital cellulitis, acute glaucoma, HSV infection, open globe, conjunctivitis, hordeolum/chalazion, FB, CRAO/CRVO.  R/o DDx: preorbital/orbital cellulitis, acute glaucoma, HSV infection, open globe, conjunctivitis, hordeolum/chalazion, FB, CRAO/CRVO: These are considered less likely due to history of present illness and physical exam findings  Review of prior external notes: 12/20/22 progress notes  Unique Tests and My Interpretation:  IOP: 14 mmHg right eye Fluoroscein Stain: No reuptake Ocular ultrasound right eye: No abnormalities noted Retinal exam: No abnormalities noted  Discussion with Independent Historian:  Son  Discussion of Management of Tests: None  Risk: Low: based on diagnostic testing/clinical impression and treatment plan  Risk Stratification Score: none   Plan: On exam patient was in no acute distress and had stable vitals.  On exam patient's pupils were PERRL however patient stated that she was having pain in her right eyeball.  Patient has documented allergy to benzonatate which is similar to the tetracaine eyedrops.  I spoke to the pharmacist Jeannett Senior and he said there is a chance of cross-reactivity and after  speaking with the patient we decided to forego the tetracaine eyedrops and proceed with the fluorescein stain.  Visual acuity will be obtained along with retinal exam and ultrasound.  Patient does see ophthalmology already and if reassuring workup patient will be discharged with ophthalmology follow-up.  Patient stable this time.  Patient's physical exam, ultrasound, visual acuity were all reassuring.  At this time patient states she feels comfortable being discharged and following up with her ophthalmologist.  I do not suspect any life-threatening diagnoses at this time as patient has been comfortable throughout the ED stay and has had reassuring workup.  I spoke to the patient and we agreed to forego CT scan of the orbits today as patient does not endorsing any pain with extraocular movements and does not have any swelling around the eyes nor erythema.  Patient was given return precautions. Patient stable for discharge at this time.  Patient verbalized understanding of plan..  Patient's visual acuity was obtained by the paramedic however the paramedic did not place the results into epic and when I went to ask is unsure of what the results were.  Patient did have reassuring visual acuity from her urgent care visit earlier today in which I saw the discharge papers with her visual acuity that were reassuring.  At this time low suspicion of any eye threatening diagnosis as patient had reassuring workup and did not endorse any vision changes.         Final Clinical Impression(s) / ED Diagnoses Final diagnoses:  Acute right eye pain    Rx / DC Orders ED Discharge Orders     None         Remi Deter 05/06/23 1538    Bethann Berkshire, MD 05/07/23 1147

## 2023-05-06 NOTE — ED Triage Notes (Addendum)
Pt ambulatory to triage with right eye pain, pain increases with with touch and movement.  States has noticed for several days.  States she was sent form [facility] because they "saw something they did not like".

## 2023-05-06 NOTE — ED Notes (Signed)
Patient states she does have high BP, takes losartan, lowered the dosage last week after blood pressures were remaining low. Explained to patient that her BP was high during triage.

## 2023-05-06 NOTE — Discharge Instructions (Signed)
Please follow-up with your ophthalmologist/eye doctor regarding recent symptoms and ER visit.  Today your physical exam including ultrasound were all reassuring however if symptoms are to change or worsen please return to ER.

## 2023-05-21 ENCOUNTER — Ambulatory Visit: Payer: Medicare Other | Admitting: Podiatry

## 2023-05-21 DIAGNOSIS — M2011 Hallux valgus (acquired), right foot: Secondary | ICD-10-CM

## 2023-05-21 DIAGNOSIS — L989 Disorder of the skin and subcutaneous tissue, unspecified: Secondary | ICD-10-CM | POA: Diagnosis not present

## 2023-05-21 NOTE — Progress Notes (Signed)
Chief Complaint  Patient presents with   Callouses    left foot calluses/ nail trim, pt denies pain     Subjective: 67 y.o. female presenting today for follow-up evaluation of pain and tenderness associated to the right forefoot.  Patient has developed symptomatic calluses to the bilateral feet and she would like them debrided today.  She was originally scheduled for surgery however the patient is very nervous and did not want to pursue surgery at this time.  She presents today for conservative care and treatment.  Past Medical History:  Diagnosis Date   Allergy    Anemia    Anxiety    Arthritis    Bladder incontinence    Cancer ([facility]) 2014   left breast    Complication of anesthesia    "had trouble waking up"   GERD (gastroesophageal reflux disease)    Headache(784.0)    migraines   High cholesterol    Hypertension    Personal history of radiation therapy 2014   Past Surgical History:  Procedure Laterality Date   ABDOMINAL HYSTERECTOMY     COMPLETE    ANTERIOR CERVICAL DECOMP/DISCECTOMY FUSION N/A 11/15/2018   Procedure: Anterior Cervical Decompression/Discectomy Fusion Cervical Three-Cervical Four, Cervical Four-Cervical Five, Cervical Five-Cervical Six;  Surgeon: Julio Sicks, MD;  Location: [facility];  Service: Neurosurgery;  Laterality: N/A;  Anterior Cervical Decompression/Discectomy Fusion Cervical Three-Cervical Four, Cervical Four-Cervical Five, Cervical Five-Cervical Six   BACK SURGERY  2019   BREAST EXCISIONAL BIOPSY Left    age 75...benign   BREAST LUMPECTOMY Left 2014   BREAST LUMPECTOMY WITH NEEDLE LOCALIZATION Left 07/11/2013   Procedure: BREAST LUMPECTOMY WITH NEEDLE LOCALIZATION;  Surgeon: Emelia Loron, MD;  Location: [facility];  Service: General;  Laterality: Left;  needle localization at [facility] 11:00    BREAST SURGERY     LEFT BREAST LUMPECTOMY - [place]   BUNIONECTOMY  1993   LEFT FOOT   BUNIONECTOMY Right NOVEMBER 2013   HAMMER TOE   NECK  SURGERY  2002   DISC REPLACED IN NECK   right knee athroscopy     2006   TUBAL LIGATION     Allergies  Allergen Reactions   Benzonatate Anaphylaxis   Amlodipine Swelling   Cyclobenzaprine     Other reaction(s): Dizziness   Other Rash   Dicloxacillin Rash and Other (See Comments)    Rash and numbness on face   Trimethoprim Rash     Objective: Physical Exam General: The patient is alert and oriented x3 in no acute distress.  Dermatology: Skin is cool, dry and supple bilateral lower extremities. Negative for open lesions or macerations.  Hyperkeratotic symptomatic calluses noted to the bilateral feet  Vascular: Palpable pedal pulses bilaterally. No edema or erythema noted. Capillary refill within normal limits.  Neurological: Epicritic and protective threshold grossly intact bilaterally.   Musculoskeletal Exam: Unchanged.  Clinical evidence of bunion deformity noted to the respective foot. There is moderate pain on palpation to the bunion deformity however there is no pain with range of motion. Lateral deviation of the hallux noted consistent with hallux abductovalgus. Hammertoe contracture also noted on clinical exam to digits second and third digits of the right foot. Symptomatic pain on palpation and range of motion also noted to the metatarsal phalangeal joints of the respective hammertoe digits.    Radiographic Exam taken last visit 06/27/2021.  Assessment: 1. 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 Hammertoe deformity second and third digits  right 3. H/o prior foot surgery, Dr. Victorino Dike, 9 yrs ago 4.  Symptomatic orthopedic hardware right 5.  Symptomatic calluses bilateral feet  Plan of Care:  -Patient was evaluated.  -Again today we discussed the conservative versus surgical management of the presenting pathology.  The patient originally was scheduled for surgery however she has been hesitant.  -The patient states that currently she is very scared of  surgery and does not want to pursue any surgical management at the moment.  We will continue conservative care -Continue wearing good supportive shoes and sneakers -Excisional debridement of the hyperkeratotic callus lesion was performed to the medial aspect of the right second toe using a tissue nipper without incident or bleeding -Return to clinic as needed    Felecia Shelling, DPM [facility]  Dr. Felecia Shelling, DPM    [address] [place], [place] [address]                Office [phone]  Fax [phone]

## 2023-07-18 ENCOUNTER — Other Ambulatory Visit: Payer: Self-pay | Admitting: Family Medicine

## 2023-07-18 DIAGNOSIS — Z1231 Encounter for screening mammogram for malignant neoplasm of breast: Secondary | ICD-10-CM

## 2023-08-21 ENCOUNTER — Ambulatory Visit
Admission: RE | Admit: 2023-08-21 | Discharge: 2023-08-21 | Disposition: A | Discharge status: Home / Self Care | Payer: Medicare Other | Source: Ambulatory Visit | Attending: Family Medicine | Admitting: Family Medicine

## 2023-08-21 DIAGNOSIS — Z1231 Encounter for screening mammogram for malignant neoplasm of breast: Secondary | ICD-10-CM

## 2024-05-22 ENCOUNTER — Other Ambulatory Visit (HOSPITAL_COMMUNITY): Payer: Self-pay | Admitting: Family Medicine

## 2024-05-22 DIAGNOSIS — Z9189 Other specified personal risk factors, not elsewhere classified: Secondary | ICD-10-CM

## 2024-05-27 ENCOUNTER — Ambulatory Visit (HOSPITAL_COMMUNITY)
Admission: RE | Admit: 2024-05-27 | Discharge: 2024-05-27 | Disposition: A | Discharge status: Home / Self Care | Source: Ambulatory Visit | Attending: Family Medicine | Admitting: Family Medicine

## 2024-05-27 DIAGNOSIS — M810 Age-related osteoporosis without current pathological fracture: Secondary | ICD-10-CM | POA: Diagnosis not present

## 2024-05-27 DIAGNOSIS — Z13820 Encounter for screening for osteoporosis: Secondary | ICD-10-CM | POA: Insufficient documentation

## 2024-05-27 DIAGNOSIS — Z9189 Other specified personal risk factors, not elsewhere classified: Secondary | ICD-10-CM | POA: Diagnosis present

## 2024-07-22 ENCOUNTER — Other Ambulatory Visit: Payer: Self-pay | Admitting: Family Medicine

## 2024-07-22 DIAGNOSIS — Z1231 Encounter for screening mammogram for malignant neoplasm of breast: Secondary | ICD-10-CM

## 2024-08-22 ENCOUNTER — Ambulatory Visit
Admission: RE | Admit: 2024-08-22 | Discharge: 2024-08-22 | Disposition: A | Discharge status: Home / Self Care | Source: Ambulatory Visit | Attending: Family Medicine | Admitting: Family Medicine

## 2024-08-22 DIAGNOSIS — Z1231 Encounter for screening mammogram for malignant neoplasm of breast: Secondary | ICD-10-CM
